# Patient Record
Sex: Female | Born: 1960 | Race: Black or African American | Hispanic: No | Smoking: Never smoker
Health system: Southern US, Community
[De-identification: ages and names within clinical notes are randomized; demographics above are authoritative.]

## PROBLEM LIST (undated history)

## (undated) DIAGNOSIS — G4733 Obstructive sleep apnea (adult) (pediatric): Secondary | ICD-10-CM

## (undated) DIAGNOSIS — I10 Essential (primary) hypertension: Secondary | ICD-10-CM

## (undated) DIAGNOSIS — K219 Gastro-esophageal reflux disease without esophagitis: Secondary | ICD-10-CM

## (undated) DIAGNOSIS — R600 Localized edema: Secondary | ICD-10-CM

## (undated) DIAGNOSIS — E119 Type 2 diabetes mellitus without complications: Secondary | ICD-10-CM

## (undated) DIAGNOSIS — Z9989 Dependence on other enabling machines and devices: Secondary | ICD-10-CM

## (undated) HISTORY — DX: Type 2 diabetes mellitus without complications: E11.9

## (undated) HISTORY — DX: Obstructive sleep apnea (adult) (pediatric): Z99.89

## (undated) HISTORY — DX: Essential (primary) hypertension: I10

## (undated) HISTORY — PX: TONSILLECTOMY: SUR1361

## (undated) HISTORY — DX: Gastro-esophageal reflux disease without esophagitis: K21.9

## (undated) HISTORY — DX: Obstructive sleep apnea (adult) (pediatric): G47.33

## (undated) HISTORY — DX: Localized edema: R60.0

---

## 2002-06-13 DIAGNOSIS — IMO0001 Reserved for inherently not codable concepts without codable children: Secondary | ICD-10-CM | POA: Insufficient documentation

## 2002-10-18 ENCOUNTER — Other Ambulatory Visit: Admission: RE | Admit: 2002-10-18 | Discharge: 2002-10-18 | Payer: Self-pay | Admitting: Family Medicine

## 2003-02-28 ENCOUNTER — Encounter: Payer: Self-pay | Admitting: Family Medicine

## 2003-02-28 ENCOUNTER — Encounter: Admission: RE | Admit: 2003-02-28 | Discharge: 2003-02-28 | Payer: Self-pay | Admitting: Family Medicine

## 2003-10-17 ENCOUNTER — Other Ambulatory Visit: Admission: RE | Admit: 2003-10-17 | Discharge: 2003-10-17 | Payer: Self-pay | Admitting: Family Medicine

## 2004-01-13 ENCOUNTER — Emergency Department (HOSPITAL_COMMUNITY): Admission: EM | Admit: 2004-01-13 | Discharge: 2004-01-13 | Payer: Self-pay | Admitting: Emergency Medicine

## 2004-03-05 ENCOUNTER — Encounter: Admission: RE | Admit: 2004-03-05 | Discharge: 2004-03-05 | Payer: Self-pay | Admitting: Family Medicine

## 2005-02-11 ENCOUNTER — Other Ambulatory Visit: Admission: RE | Admit: 2005-02-11 | Discharge: 2005-02-11 | Payer: Self-pay | Admitting: Family Medicine

## 2005-04-15 ENCOUNTER — Encounter: Admission: RE | Admit: 2005-04-15 | Discharge: 2005-04-15 | Payer: Self-pay | Admitting: Family Medicine

## 2006-07-07 ENCOUNTER — Other Ambulatory Visit: Admission: RE | Admit: 2006-07-07 | Discharge: 2006-07-07 | Payer: Self-pay | Admitting: Family Medicine

## 2006-08-25 ENCOUNTER — Encounter: Admission: RE | Admit: 2006-08-25 | Discharge: 2006-08-25 | Payer: Self-pay | Admitting: Family Medicine

## 2007-07-13 ENCOUNTER — Other Ambulatory Visit: Admission: RE | Admit: 2007-07-13 | Discharge: 2007-07-13 | Payer: Self-pay | Admitting: Family Medicine

## 2007-09-07 ENCOUNTER — Encounter: Admission: RE | Admit: 2007-09-07 | Discharge: 2007-09-07 | Payer: Self-pay | Admitting: Family Medicine

## 2008-05-23 ENCOUNTER — Inpatient Hospital Stay (HOSPITAL_COMMUNITY): Admission: RE | Admit: 2008-05-23 | Discharge: 2008-05-25 | Payer: Self-pay | Admitting: Otolaryngology

## 2008-05-23 ENCOUNTER — Encounter (INDEPENDENT_AMBULATORY_CARE_PROVIDER_SITE_OTHER): Payer: Self-pay | Admitting: Otolaryngology

## 2008-05-27 ENCOUNTER — Emergency Department (HOSPITAL_COMMUNITY): Admission: EM | Admit: 2008-05-27 | Discharge: 2008-05-27 | Payer: Self-pay | Admitting: Emergency Medicine

## 2009-11-06 ENCOUNTER — Other Ambulatory Visit: Admission: RE | Admit: 2009-11-06 | Discharge: 2009-11-06 | Payer: Self-pay | Admitting: Family Medicine
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
## 2010-10-26 NOTE — Discharge Summary (Signed)
Amanda Alexander, Amanda Alexander               ACCOUNT NO.:  [id number]   MEDICAL RECORD NO.:  [id number]          PATIENT TYPE:  INP   LOCATION:  3311                         FACILITY:  [facility]   PHYSICIAN:  Lucky Cowboy, MD         DATE OF BIRTH:  02-23-1961   DATE OF ADMISSION:  05/23/2008  DATE OF DISCHARGE:  05/25/2008                               DISCHARGE SUMMARY   DISCHARGE DIAGNOSES:  1. Right peritonsillar abscess with acute tonsillitis and acute airway      obstruction.  2. Diabetes mellitus, type 2.  3. Obesity, hyperventilation syndrome with obstructive sleep apnea      using CPAP.  4. Recently diagnosed H1N1.  5. Intermittent chest pain.   PROCEDURES PERFORMED:  Tonsillectomy with uvulopalatopharyngoplasty and  uvulectomy.   CONSULTATIONS:  [facility] hospitalist consultation followed by  evaluation by [facility] from [place].   HOSPITAL COURSE:  This patient is a 50 year old female who was initially  referred to my office by Dr. Carolin Coy on May 23, 2008, for  evaluation of right peritonsillar abscess and acute tonsillitis.  The  patient in fact did have these findings and due to impending airway, was  taken to [facility], admitted and initially evaluated by  [facility] physician, Dr. Jetty Duhamel, for surgical medical  clearance.  She was then taken to surgery where bilateral tonsillectomy,  uvulopalatopharyngoplasty, and uvulectomy were performed.  She did have  a right inferiorly based abscess.  The reason that  uvulopalatopharyngoplasty was performed is that she had intense edema,  which was approximately 2-cm thick and was coming off onto the posterior  pharyngeal tonsillar pillar and the concern was that even despite  tonsillectomy and drainage of the abscess that when the patient awakened  from anesthesia that there would acute airway obstruction and subsequent  to that if she passed that then she would still have problems with  sleep  apnea and airway maintenance at night.  This did open up for airway and  she was successfully extubated.  She was cared for in room 3311 in  Intermediate Care Unit.  She maintained 100% percent oxygenation while  on 2 L of oxygen on 5 cm of CPAP pressure.  Initial white blood cell  count was 17,000 and decreased to 13,000 by the following day.  She was  treated with IV Unasyn and continued on her home medications of  metformin 1000 mg q.a.m. and also hydrochlorothiazide 25 mg q.a.m.  She  was continued on Tamiflu 75 mg b.i.d., which was initially held the day  that she did come in.  She also had her aspirin 81 mg held in light that  she would be undergoing a procedure that has a 1-2% risk of the day 7  postoperative bleeding.  On postoperative day #1, the patient was  remarkably better.  She was able to tolerate some soft foods and fluids,  but was still having some difficulty with pain control.  Following day,  fluid intake was much better and she had been changed over to Roxicet  with breakthrough pain managed  by liquid oxycodone.  She did have mouth  ulcer on the right upper maxillary gingiva, which was improved with an  nystatin swish and spit out as Hurricane Gel.  Her underlying social  situation involves working full time as a Clinical biochemist at [employer].  Given this fact, she should not do any real strenuous activity  for 2 weeks and probably still will not feel up to it by 3 weeks.  She  has short-term disability and the forms will be completed with her when  they are given to my office at [facility].  On  postoperative day #2, she was doing well and was deemed ready to be  discharged, and was discharged from the Intermediate Care Unit, having  tolerated CPAP in the evening for 2 nights.  Discharge medications were  called in the [facility], to be followed up by a written script.  Tylenol with oxycodone elixir 10-20 mL p.o. q.4-6 h.  p.r.n. pain, 500 mL  were dispensed with no refill.  Oxycodone 5 mg/5 mL, 50 mL dispensed.  To be taken 2.5 mL p.o. q.4 h. p.r.n. breakthrough pain.  Can be given  between 2-3 hours after Roxicet dosage if needed.  Further conversation  is to take amoxicillin 250 mg per 10 mL p.o. t.i.d. for 5 days.  She was  also dispensed nystatin suspension 5 mL swish and spit out t.i.d. for 7  days.  She was given benzocaine to go home with, to use on the right  maxillary ulcer.  If this is not adequate amount, she can get this over  the counter at [facility] along with Peri-Colace that she is  advised to take b.i.d. until after completing the narcotic, oxycodone.  She is to continue metformin and hydrochlorothiazide at standard doses  of 1000 mg q.a.m. and 25 mg daily, respectively.  She was advised to  hold her aspirin for 10 days due to bleeding risk.  She is to drink at  least 6 ounces of fluid per hour and document everything that she drinks  including popsicles and ice cream.  She will be contacted through her  daughter, Sunny Schlein at number given to me by her daughters [phone] to  determine how she is doing and also to give her a 3-week postop  appointment.  Diet is full liquids to regular diet.  She is able to  tolerate most likely best that she avoid any ascitic or spicy-type foods  and fluids.  She may use the Hurricane Gel over 2 hours as needed.  She  is to continue to check her blood sugars and ranges should be a little  bit more liberal given this kind of situation and would be acceptable  from my standpoint, range between 100 and 200, however, Dr. Carolin Coy and should be notified and would be the ultimate authority on  where these sugars should be maintained at.  The family understands and  these instructions were discussed in detail with both the patient and  her daughter as well as the patient's son today and also yesterday.  She  was given a dose of narcotic pain  medicine prior her discharge and also  extra benzocaine.  She will follow up in the office in 3 weeks.  She is  discharged to her daughter's home in stable condition.      Lucky Cowboy, MD  Electronically Signed     SJ/MEDQ  D:  05/25/2008  T:  05/26/2008  Job:  [id number]   cc:   [facility]  Carola J. Gerri Spore, M.D.

## 2010-10-26 NOTE — Op Note (Signed)
Amanda Alexander, Amanda Alexander               ACCOUNT NO.:  [id number]   MEDICAL RECORD NO.:  [id number]          PATIENT TYPE:  OIB   LOCATION:  3311                         FACILITY:  [facility]   PHYSICIAN:  Lucky Cowboy, MD         DATE OF BIRTH:  September 03, 1960   DATE OF PROCEDURE:  05/23/2008  DATE OF DISCHARGE:                               OPERATIVE REPORT   PREOPERATIVE DIAGNOSES:  Right peritonsillar abscess, acute tonsillitis.   POSTOPERATIVE DIAGNOSES:  Right peritonsillar abscess, acute  tonsillitis.   PROCEDURE:  Bilateral palatine tonsillectomy, uvulopalatopharyngoplasty  with uvulectomy.   SURGEON:  Lucky Cowboy, MD   ANESTHESIA:  General endotracheal anesthesia.   ESTIMATED BLOOD LOSS:  30 mL.   SPECIMENS:  Uvula and bilateral palatine tonsils, aerobic and anaerobic  with Gram stain cultures from right inferior tonsillar pole abscess.   COMPLICATIONS:  None.   INDICATIONS:  This patient is a 50 year old female with a 3-day history  of severe throat swelling and trismus with right ear pain.  She was  diagnosed 4 days ago with H1N1 flu and started on Tamiflu, 2 days ago.  She then started with severe throat pain-type symptoms.  She was seen  today by Dr. Carolin Coy, who diagnosed right peritonsillar  abscess and sent her over to the office for evaluation.  A large right  peritonsillar abscess was diagnosed in the office and she was admitted  immediately for evaluation by a hospitalist prior to planning for  tonsillectomy.  The need for tonsillectomy was due to the patient's  underlying severe obstructive sleep apnea and impending airway  compromise and anticipated postoperative edema and effort to avoid  tracheotomy.  The entire situation with the concerns were discussed with  the patient and her 2 daughters.  The risks, benefits, and options were  discussed and they have elected to proceed.  The patient is a Multimedia programmer and signed appropriate paperwork.  I have agreed  to deny the  patient any blood products as she has requested.   FINDINGS:  The patient was noted to have a small right inferior  tonsillar pole abscess.  Cultures were obtained.  The patient did have  massive edema of the posterior pharyngeal wall with a line delineating  left from right.  The massive edema then came off the posterior  pharyngeal wall and for this reason, the uvulopalatopharyngoplasty and  first portion of the procedure was performed where the anterior and  posterior tonsillar pillars were securely stitched to one another to try  to bring that posterior tonsillar pillar near the lateral wall of the  posterior pharyngeal wall more laterally so as to give the patient  adequate airway as the edema extended down into the hypopharynx.  This  did not work relatively well; however, there is still significant amount  of edema.   DESCRIPTION OF PROCEDURE:  The patient was taken to the operating room  and placed on the table in the supine position.  She was then placed  under general endotracheal anesthesia and the table rotated  counterclockwise 90 degrees.  The  head and body were draped in the usual  fashion.  Crowe-Davis mouth gag with a #4 tongue blade was then placed  intraorally, opened and suspended on the Mayo stand.  Palpation of the  soft palate is with the findings as noted above.  Straight Allis clamp  was directed inferomedially.  Bovie cautery was then used to excise the  tonsil staying within the peritonsillar space.  There was moderate  amount of bleeding, which did not exceed 30 mL.  Cultures were obtained  from the purulent exudate in the inferior right tonsillar pole.  The  left palatine tonsil was removed in identical fashion.  At this point,  the uvula was removed after palpating the levator dimple.  The anterior  and posterior tonsillar pillars were reapproximated in a simple  interrupted fashion as was the raw edges from the uvula resection.  They  were  approximated using 4-0 Vicryl.  The nasopharynx and oral cavity  were irrigated and suctioned free of debris.  An NG tube was placed on  the esophagus for suctioning of the gastric contents.  The mouth gag was  removed noting no damage to the teeth or soft tissues.  The table was  then rotated anticlockwise.  The patient was then awakened from  anesthesia and extubated into the operating room.  She did have some  initial stridor, but as she was awakening, was able to breath and  maintain her airway on her own with significant suctioning with the  Yankauer.  She will then be returned to her room at 3307 in the  intermediate intensive care level.  She will be observed for 1-2 days  and followed also by the hospitalist.      Lucky Cowboy, MD  Electronically Signed     SJ/MEDQ  D:  05/23/2008  T:  05/24/2008  Job:  [id number]   cc:   [facility]  Carola J. Gerri Spore, M.D.

## 2010-10-26 NOTE — Consult Note (Signed)
[name], Amanda Alexander               ACCOUNT NO.:  [id number]   MEDICAL RECORD NO.:  [id number]          PATIENT TYPE:  OIB   LOCATION:  3311                         FACILITY:  [facility]   PHYSICIAN:  Amanda Alexander, M.D.DATE OF BIRTH:  1960-08-16   DATE OF CONSULTATION:  DATE OF DISCHARGE:                                 CONSULTATION   PRIMARY CARE PHYSICIAN:  Dr. Gerri Alexander with [facility] at [place].   REASON FOR CONSULTATION:  Preop evaluation of a diabetic hypertensive  patient with acute peritonsillar abscess.   HISTORY OF PRESENT ILLNESS:  Ms. Amanda Alexander is a very pleasant 50-  year-old female who was admitted to the hospital today by Dr. Langston Alexander, ENT physician, for surgical management of a large peritonsillar  abscess and severe tonsillitis.  While in the day surgery/preop area,  the patient spiked a temperature to 103.1 and required transfer to the  inpatient unit prior to her surgical procedure.  Dr. Gerilyn Alexander has asked  that I evaluate the patient preoperatively and help follow her clinical  course in the postoperative phase.   At the present time Ms. Amanda Alexander is resting comfortably in her bed in the  step-down unit at [facility].  Upon entering the room, she  complains of severe pain in her throat.  She does feel that her throat  is closing off.  She did not feel that it has worsened over the last 2  hours, however.  There is no stridor appreciable.  The patient is able  to answer questions and is completing full sentences.  She complains of  intermittent chest pain.  She states that she has had this for multiple  years.  She was given nitro for this, which typically assists with  relief of the pain.  She tells me that she underwent a cardiac eval in  2005 but she cannot tell me the exact results of that evaluation.  When  she is at her baseline, however, she does not experience severe dyspnea  on exertion and she does not typically have substernal chest pain  with  physical exertion.  She was evaluated on Monday of this week for initial  onset of her soreness of throat, general lethargy, body aches, and  chills.  She was diagnosed at that time with H1N1 and placed on Tamiflu.  When her symptoms worsened, she went see her primary care physician  today, who ultimately sent her to Dr. Gerilyn Alexander, resulting in her placement  here in the hospital.   REVIEW OF SYSTEMS:  The patient does not have current active symptoms to  suggest unstable angina pectoris.  Comprehensive review of systems is  negative with exception to the positive elements noted in the history of  present illness that is listed above.   PAST MEDICAL HISTORY:  1. Diabetes mellitus.  2. Hypertension.  3. Obesity, hyperventilation syndrome/sleep apnea - q.h.s. CPAP.  4. Recently-diagnosed H1N1.  5. Intermittent chest pain.      a.     Normal Cardiolite eval August 2005 with an EF of 72% as  confirmed by the primary care physician's office.      b.     Symptoms not consistent with true unstable angina pectoris.      c.     Negative biochemical, rule out via ER stay in 2005.   OUTPATIENT MEDICATIONS:  1. Metformin 1000 mg p.o. q.a.m.  2. HCTZ 25 mg daily.  3. Aspirin 81 mg daily.  4. Tamiflu 75 mg b.i.d., to stop today.  5. Multivitamin daily.   ALLERGIES:  NO KNOWN DRUG ALLERGIES.   FAMILY HISTORY:  The patient's mother died with complications from  diabetes.  The patient's father died from lung cancer and was a smoker.   SOCIAL HISTORY:  The patient does not smoke.  She lives in [place].  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   DATA REVIEW:  Hemoglobin is 11.2 with a white count of 15.2.  Normal  platelet count and normal MCV.  BMET is pending but is not readily  available.  A 12-lead EKG has been requested but is not yet  available.   PHYSICAL EXAM:  Temperature 100.2, Alexander pressure 134/65, heart rate 99,  respiratory rate 18, 02 sat is 100% on room air.  GENERAL:  An obese female in no acute respiratory distress.  HEENT:  Normocephalic, atraumatic.  Pupils equal, round, reactive to  light and accommodation.  Extraocular muscles are intact.  NECK:  No JVD.  LUNGS:  Clear to auscultation bilaterally without wheezing or rhonchi.  CARDIOVASCULAR:  Regular rate and rhythm without murmur, gallop or rub,  with normal S1 and S2.  ABDOMEN:  Obese.  Soft.  Bowel sounds present.  No organomegaly.  No  rebound.  No ascites.  EXTREMITIES:  Trace bilateral lower extremity edema.  No significant  cyanosis or clubbing.  NEUROLOGIC:  Alert and oriented x4.  Cranial II-XII intact bilaterally,  5/5 strength bilateral upper and lower extremities.  Intact sensation  noted throughout.   IMPRESSION AND PLAN:  1. Preop evaluation - Ms. Amanda Alexander does, in fact, have a severe acute      suppurative tonsillitis/peritonsillar abscess as diagnosed by Dr.      Gerilyn Alexander.  There is significant concern of her compromising her airway      due to the severity of her illness.  Dr. Gerilyn Alexander wishes to take the      patient to the operating room tonight to perform a complete      tonsillectomy.  At the present time I do not hear a gallop on exam.      There is no appreciable murmur.  The patient does not have symptoms      consistent with a true unstable angina pectoris.  The patient did      have a cardiac eval for her atypical chest pain in 2005, which was      entirely normal and revealed an ejection fraction of 72%.  At the      present time I do not feel there is much to be gained by prolonging      the patient's surgical procedure for further cardiac evaluation.      Indeed, I do not feel that this is indicated presently.  I will      classify her as a low-to-intermediate risk for her surgical      procedure, and would recommend proceeding  with it tonight,  understanding that risk.  This information has been relayed to the      patient.  As noted above, an echocardiogram has not yet been      accomplished.  I am asking that this be accomplished at the present      time.  If there are significant acute changes noted there, it could      change my assessment, but otherwise my plan will be as detailed      above.  2. Diabetes mellitus - diabetes control will be clearly indicated in      the perioperative phase.  We will hold the patient's metformin due      to the possibility of a need for contrasted studies during her      hospital stay.  We will place her on sliding-scale insulin and      adjust insulin therapy as indicated based upon the results of      frequent CBG checks.  3. Hypertension - the patient's Alexander pressure is currently reasonably      controlled.  We will hold her HCTZ due to a propensity for      dehydration that I suspect will occur in the postoperative state.      We will follow her Alexander pressure closely and initiate non diuretic      therapy as indicated.  4. Obesity, hyperventilation syndrome/sleep apnea - Continuous      positive airway pressure will be administered at the discretion of      Dr. Gerilyn Alexander.  I suspect that it will be inappropriate to use this in      the immediate perioperative period because of severe pain in the      throat and the likelihood of drying the wounds excessively.   During my evaluation of Ms. Hett, it became clear that she is actually  followed by Dr. Gerri Alexander at [facility] [place].  As a result, the  [facility] hospitalist service will be the most appropriate to provide  ongoing care.  In the interest of not further delaying the patient's  surgical course, however, I have proceeded with the initial evaluation.  I have contacted the on-call physician for [facility] hospitalist tonight and  they have agreed to assume care of the patient in the postoperative  period from a  standpoint of consultative service.      Amanda Alexander, M.D.  Electronically Signed     JTM/MEDQ  D:  05/23/2008  T:  05/23/2008  Job:  [id number]

## 2011-03-18 LAB — NO BLOOD PRODUCTS

## 2011-03-18 LAB — GLUCOSE, CAPILLARY
Glucose-Capillary: 108 mg/dL — ABNORMAL HIGH (ref 70–99)
Glucose-Capillary: 117 mg/dL — ABNORMAL HIGH (ref 70–99)
Glucose-Capillary: 122 mg/dL — ABNORMAL HIGH (ref 70–99)
Glucose-Capillary: 122 mg/dL — ABNORMAL HIGH (ref 70–99)
Glucose-Capillary: 124 mg/dL — ABNORMAL HIGH (ref 70–99)
Glucose-Capillary: 135 mg/dL — ABNORMAL HIGH (ref 70–99)
Glucose-Capillary: 152 mg/dL — ABNORMAL HIGH (ref 70–99)
Glucose-Capillary: 155 mg/dL — ABNORMAL HIGH (ref 70–99)

## 2011-03-18 LAB — COMPREHENSIVE METABOLIC PANEL
ALT: 19 U/L (ref 0–35)
AST: 16 U/L (ref 0–37)
Albumin: 2.5 g/dL — ABNORMAL LOW (ref 3.5–5.2)
Alkaline Phosphatase: 74 U/L (ref 39–117)
BUN: 8 mg/dL (ref 6–23)
CO2: 29 mEq/L (ref 19–32)
Calcium: 8.2 mg/dL — ABNORMAL LOW (ref 8.4–10.5)
Chloride: 96 mEq/L (ref 96–112)
Creatinine, Ser: 0.67 mg/dL (ref 0.4–1.2)
GFR calc Af Amer: >60 mL/min (ref >60)
GFR calc non Af Amer: >60 mL/min (ref >60)
Glucose, Bld: 130 mg/dL — ABNORMAL HIGH (ref 70–99)
Potassium: 3.7 mEq/L (ref 3.5–5.1)
Sodium: 133 mEq/L — ABNORMAL LOW (ref 135–145)
Total Bilirubin: 0.8 mg/dL (ref 0.3–1.2)
Total Protein: 6.3 g/dL (ref 6.0–8.3)

## 2011-03-18 LAB — TSH: TSH: 0.973 u[IU]/mL (ref 0.350–4.500)

## 2011-03-18 LAB — CBC
HCT: 29.2 % — ABNORMAL LOW (ref 36.0–46.0)
HCT: 33.5 % — ABNORMAL LOW (ref 36.0–46.0)
Hemoglobin: 11.2 g/dL — ABNORMAL LOW (ref 12.0–15.0)
Hemoglobin: 9.8 g/dL — ABNORMAL LOW (ref 12.0–15.0)
MCHC: 33.4 g/dL (ref 30.0–36.0)
MCHC: 33.5 g/dL (ref 30.0–36.0)
MCV: 87.8 fL (ref 78.0–100.0)
MCV: 88.4 fL (ref 78.0–100.0)
Platelets: 291 10*3/uL (ref 150–400)
Platelets: 326 10*3/uL (ref 150–400)
RBC: 3.3 MIL/uL — ABNORMAL LOW (ref 3.87–5.11)
RBC: 3.81 MIL/uL — ABNORMAL LOW (ref 3.87–5.11)
RDW: 15.5 % (ref 11.5–15.5)
RDW: 15.8 % — ABNORMAL HIGH (ref 11.5–15.5)
WBC: 13.2 10*3/uL — ABNORMAL HIGH (ref 4.0–10.5)
WBC: 15.2 10*3/uL — ABNORMAL HIGH (ref 4.0–10.5)

## 2011-03-18 LAB — ANAEROBIC CULTURE

## 2011-03-18 LAB — HEMOGLOBIN A1C
Hgb A1c MFr Bld: 6.7 % — ABNORMAL HIGH (ref 4.6–6.1)
Mean Plasma Glucose: 146 mg/dL

## 2011-03-18 LAB — DIFFERENTIAL
Basophils Absolute: 0.1 10*3/uL (ref 0.0–0.1)
Basophils Relative: 0 % (ref 0–1)
Eosinophils Absolute: 0 10*3/uL (ref 0.0–0.7)
Eosinophils Relative: 0 % (ref 0–5)
Lymphocytes Relative: 10 % — ABNORMAL LOW (ref 12–46)
Lymphs Abs: 1.6 10*3/uL (ref 0.7–4.0)
Monocytes Absolute: 1.5 10*3/uL — ABNORMAL HIGH (ref 0.1–1.0)
Monocytes Relative: 10 % (ref 3–12)
Neutro Abs: 12.1 10*3/uL — ABNORMAL HIGH (ref 1.7–7.7)
Neutrophils Relative %: 80 % — ABNORMAL HIGH (ref 43–77)

## 2011-03-18 LAB — BASIC METABOLIC PANEL
BUN: 7 mg/dL (ref 6–23)
CO2: 29 mEq/L (ref 19–32)
Calcium: 9.3 mg/dL (ref 8.4–10.5)
Chloride: 98 mEq/L (ref 96–112)
Creatinine, Ser: 0.73 mg/dL (ref 0.4–1.2)
GFR calc Af Amer: >60 mL/min (ref >60)
GFR calc non Af Amer: >60 mL/min (ref >60)
Glucose, Bld: 134 mg/dL — ABNORMAL HIGH (ref 70–99)
Potassium: 4 mEq/L (ref 3.5–5.1)
Sodium: 138 mEq/L (ref 135–145)

## 2011-03-18 LAB — WOUND CULTURE

## 2011-05-13 ENCOUNTER — Ambulatory Visit
Admission: RE | Admit: 2011-05-13 | Discharge: 2011-05-13 | Disposition: A | Discharge status: Home / Self Care | Payer: 59 | Source: Ambulatory Visit | Attending: Family Medicine | Admitting: Family Medicine

## 2011-05-13 ENCOUNTER — Other Ambulatory Visit: Payer: Self-pay | Admitting: Family Medicine

## 2011-05-13 DIAGNOSIS — M25571 Pain in right ankle and joints of right foot: Secondary | ICD-10-CM

## 2011-05-25 ENCOUNTER — Other Ambulatory Visit: Payer: Self-pay | Admitting: Family Medicine

## 2011-05-25 DIAGNOSIS — Z1231 Encounter for screening mammogram for malignant neoplasm of breast: Secondary | ICD-10-CM

## 2011-05-27 ENCOUNTER — Ambulatory Visit
Admission: RE | Admit: 2011-05-27 | Discharge: 2011-05-27 | Disposition: A | Discharge status: Home / Self Care | Payer: 59 | Source: Ambulatory Visit | Attending: Family Medicine | Admitting: Family Medicine

## 2011-05-27 DIAGNOSIS — Z1231 Encounter for screening mammogram for malignant neoplasm of breast: Secondary | ICD-10-CM

## 2012-03-27 ENCOUNTER — Other Ambulatory Visit: Payer: Self-pay | Admitting: Family Medicine

## 2012-03-27 DIAGNOSIS — Z1231 Encounter for screening mammogram for malignant neoplasm of breast: Secondary | ICD-10-CM

## 2012-05-01 ENCOUNTER — Other Ambulatory Visit: Payer: Self-pay | Admitting: Family Medicine

## 2012-05-01 ENCOUNTER — Other Ambulatory Visit (HOSPITAL_COMMUNITY)
Admission: RE | Admit: 2012-05-01 | Discharge: 2012-05-01 | Disposition: A | Discharge status: Home / Self Care | Payer: 59 | Source: Ambulatory Visit | Attending: Family Medicine | Admitting: Family Medicine

## 2012-05-01 DIAGNOSIS — Z124 Encounter for screening for malignant neoplasm of cervix: Secondary | ICD-10-CM | POA: Insufficient documentation

## 2012-05-28 ENCOUNTER — Ambulatory Visit
Admission: RE | Admit: 2012-05-28 | Discharge: 2012-05-28 | Disposition: A | Discharge status: Home / Self Care | Payer: 59 | Source: Ambulatory Visit | Attending: Family Medicine | Admitting: Family Medicine

## 2012-05-28 DIAGNOSIS — Z1231 Encounter for screening mammogram for malignant neoplasm of breast: Secondary | ICD-10-CM

## 2013-03-01 ENCOUNTER — Encounter: Payer: 59 | Admitting: Internal Medicine

## 2013-03-08 ENCOUNTER — Encounter: Payer: Self-pay | Admitting: Internal Medicine

## 2013-03-08 ENCOUNTER — Ambulatory Visit (INDEPENDENT_AMBULATORY_CARE_PROVIDER_SITE_OTHER): Payer: 59 | Admitting: Internal Medicine

## 2013-03-08 VITALS — BP 107/73 | HR 77 | Temp 97.6°F | Ht 61.0 in | Wt 233.5 lb

## 2013-03-08 DIAGNOSIS — Z0000 Encounter for general adult medical examination without abnormal findings: Secondary | ICD-10-CM

## 2013-03-08 DIAGNOSIS — I10 Essential (primary) hypertension: Secondary | ICD-10-CM

## 2013-03-08 DIAGNOSIS — G4733 Obstructive sleep apnea (adult) (pediatric): Secondary | ICD-10-CM

## 2013-03-08 DIAGNOSIS — Z9989 Dependence on other enabling machines and devices: Secondary | ICD-10-CM

## 2013-03-08 DIAGNOSIS — E119 Type 2 diabetes mellitus without complications: Secondary | ICD-10-CM

## 2013-03-08 LAB — COMPREHENSIVE METABOLIC PANEL
Albumin: 3.9 g/dL (ref 3.5–5.2)
CO2: 28 mEq/L (ref 19–32)
Glucose, Bld: 121 mg/dL — ABNORMAL HIGH (ref 70–99)
Potassium: 4.3 mEq/L (ref 3.5–5.3)
Sodium: 139 mEq/L (ref 135–145)
Total Protein: 7.5 g/dL (ref 6.0–8.3)

## 2013-03-08 LAB — CBC
Hemoglobin: 12.2 g/dL (ref 12.0–15.0)
RBC: 4.24 MIL/uL (ref 3.87–5.11)

## 2013-03-08 LAB — POCT GLYCOSYLATED HEMOGLOBIN (HGB A1C): Hemoglobin A1C: 7.5

## 2013-03-08 LAB — GLUCOSE, CAPILLARY: Glucose-Capillary: 115 mg/dL — ABNORMAL HIGH (ref 70–99)

## 2013-03-08 MED ORDER — LISINOPRIL-HYDROCHLOROTHIAZIDE 20-25 MG PO TABS: 1.0000 | ORAL_TABLET | Freq: Every day | ORAL | Status: DC

## 2013-03-08 MED ORDER — METFORMIN HCL 1000 MG PO TABS: 1000.0000 mg | ORAL_TABLET | Freq: Two times a day (BID) | ORAL | Status: DC

## 2013-03-08 MED ORDER — GLYBURIDE 2.5 MG PO TABS: 2.5000 mg | ORAL_TABLET | Freq: Every day | ORAL | Status: DC

## 2013-03-08 NOTE — Patient Instructions (Signed)
Goals (1 Years of Data) as of 03/08/13   None     It was a pleasure meeting you today and in the future as we will continue to work on your diabetes. Continue taking your medication and we will schedule you with our diabetes educator.   If you have any questions please don't hesitate to call our clinic: [phone]

## 2013-03-09 LAB — LIPID PANEL: Cholesterol: 174 mg/dL (ref 0–200)

## 2013-03-10 ENCOUNTER — Encounter: Payer: Self-pay | Admitting: Internal Medicine

## 2013-03-10 MED ORDER — GLYBURIDE 2.5 MG PO TABS: 2.5000 mg | ORAL_TABLET | Freq: Two times a day (BID) | ORAL | Status: DC

## 2013-03-10 NOTE — Progress Notes (Signed)
Subjective:   Patient ID: Amanda Alexander female   DOB: 10/22/1960 52 y.o.   MRN: [id number]  HPI: Amanda Alexander is a 52 y.o. AA woman who comes in to establish care with [facility]. This is her first visit. She states that she has long standing DM and HTN for 10+ years and had a physician whom retired and then she was dismissed from the practice for unknown reasons.  Medications, PMH, social hx, surgical and family hx were completed with the patient and recorded in the chart.   A full 12 point ROS was completed and was negative. The patient is aware of her hypoglycemic symptoms that consist of "jitteriness" and dizziness. She has not had those symptoms lately and CBG readings have not gone below 60.     History reviewed. No pertinent past medical history. Current Outpatient Prescriptions  Medication Sig Dispense Refill  . glyBURIDE (DIABETA) 2.5 MG tablet Take 1 tablet (2.5 mg total) by mouth daily with breakfast.  30 tablet  11  . lisinopril-hydrochlorothiazide (PRINZIDE,ZESTORETIC) 20-25 MG per tablet Take 1 tablet by mouth daily.  30 tablet  12  . metFORMIN (GLUCOPHAGE) 1000 MG tablet Take 1 tablet (1,000 mg total) by mouth 2 (two) times daily with a meal.  60 tablet  11   No current facility-administered medications for this visit.   History reviewed. No pertinent family history. History   Social History  . Marital Status: Widowed    Spouse Name: N/A    Number of Children: N/A  . Years of Education: N/A   Social History Main Topics  . Smoking status: Never Smoker   . Smokeless tobacco: None  . Alcohol Use: None  . Drug Use: None  . Sexual Activity: None   Other Topics Concern  . None   Social History Narrative  . None   Review of Systems: Constitutional: Denies fever, chills, diaphoresis, appetite change and fatigue.  HEENT: Denies photophobia, eye pain, redness, hearing loss, ear pain, congestion, sore throat, rhinorrhea, sneezing, mouth sores, trouble swallowing,  neck pain, neck stiffness and tinnitus.   Respiratory: Denies SOB, DOE, cough, chest tightness,  and wheezing.   Cardiovascular: Denies chest pain, palpitations and leg swelling.  Gastrointestinal: Denies nausea, vomiting, abdominal pain, diarrhea, constipation, blood in stool and abdominal distention.  Genitourinary: Denies dysuria, urgency, frequency, hematuria, flank pain and difficulty urinating.  Endocrine: Denies: hot or cold intolerance, sweats, changes in hair or nails, polyuria, polydipsia. Musculoskeletal: Denies myalgias, back pain, joint swelling, arthralgias and gait problem.  Skin: Denies pallor, rash and wound.  Neurological: Denies dizziness, seizures, syncope, weakness, light-headedness, numbness and headaches.  Hematological: Denies adenopathy. Easy bruising, personal or family bleeding history  Psychiatric/Behavioral: Denies suicidal ideation, mood changes, confusion, nervousness, sleep disturbance and agitation  Objective:  Physical Exam: Filed Vitals:   03/08/13 1431  BP: 107/73  Pulse: 77  Temp: 97.6 F (36.4 C)  TempSrc: Oral  Height: 5\' 1"  (1.549 m)  Weight: 233 lb 8 oz (105.915 kg)  SpO2: 100%   General: sitting in chair, comfortably HEENT: PERRL, EOMI, no scleral icterus Cardiac: RRR, no rubs, murmurs or gallops Pulm: clear to auscultation bilaterally, moving normal volumes of air Abd: soft, nontender, nondistended, BS present Ext: warm and well perfused, no pedal edema Neuro: alert and oriented X3, cranial nerves II-XII grossly intact   Assessment & Plan:  1. DM: Pt POCT HgbA1c 7.5 today but pt didn't bring in meter for analysis. Pt states had flu shot at her work.  -  DM foot exam -opthamology referral -urine microablumin at next visit -continue metformin 1000 mg BID and glybride 2.5mg  BID -DM educator referral  2. OSA: pt on cpap and has had sleep study from previous established care  3. HTN: pt has good adequate control at this time.  BP  Readings from Last 3 Encounters:  03/08/13 107/73   -continue HCTZ/lisinopril 25-20 mg pill -cmet  4. Health maintenance: pt states having had mammogram 2013 and colonoscopy 2012 and PAP smear in 2013 all per pt report normal.  -health release form to obtain old records -FLP, CBC  Pt discussed with Dr. Aundria Rud

## 2013-03-11 ENCOUNTER — Encounter: Payer: Self-pay | Admitting: Internal Medicine

## 2013-03-20 ENCOUNTER — Telehealth: Payer: Self-pay | Admitting: *Deleted

## 2013-03-20 NOTE — Telephone Encounter (Signed)
Call to pt to see if she has seen an Opthalmologist before and when will be a convenient time for her to go for an appointment.  Message left on pt's VM to call the Clinics with that information.  Angelina Ok, RN 03/20/2013 9:20 AM.

## 2013-04-01 ENCOUNTER — Encounter: Payer: 59 | Admitting: Dietician

## 2013-04-01 ENCOUNTER — Ambulatory Visit (INDEPENDENT_AMBULATORY_CARE_PROVIDER_SITE_OTHER): Payer: 59 | Admitting: Dietician

## 2013-04-01 VITALS — Ht 60.25 in | Wt 231.5 lb

## 2013-04-01 DIAGNOSIS — E119 Type 2 diabetes mellitus without complications: Secondary | ICD-10-CM

## 2013-04-01 NOTE — Patient Instructions (Signed)
Your goal for the next 3 weeks to help you with weight loss  400 -500 calories at each meal 1- take skin off chicken 2- drink water or diet soda, tea with splenda 3- Less alcohol drinks- rum  Please make a follow up appointment for 3-4 weeks

## 2013-04-01 NOTE — Progress Notes (Signed)
Diabetes Self-Management Education  Visit Number: Follow-up 1  04/01/2013 Amanda Alexander, identified by name and date of birth, is a 52 y.o. female with Type 2 diabetes  .      ASSESSMENT  Patient Concerns:   weight loss, hopes this may help her come off her C-PAP, diabetes medications and control her blood pressure  Height 5' 0.25" (1.53 m), weight 231 lb 8 oz (105.008 kg). Body mass index is 44.86 kg/(m^2).  Lab Results: LDL Cholesterol  Date Value Range Status  03/08/2013 90  0 - 99 mg/dL Final          Hemoglobin A1C  Date Value Range Status  03/08/2013 7.5   Final  05/24/2008 6.7 (NOTE)   The ADA recommends the following therapeutic goal for glycemic   control related to Hgb A1C measurement:   Goal of Therapy:   < 7.0% Hgb A1C   Reference: American Diabetes Association: Clinical Practice   Recommendations 2008, Diabetes Care,  2008, 31:(Suppl 1).* 4.6 - 6.1 % Final     Family History  Problem Relation Age of Onset  . Cancer Father    History  Substance Use Topics  . Smoking status: Never Smoker   . Smokeless tobacco: Not on file  . Alcohol Use: No    Support Systems:  need to assess at future visit Special Needs:    no Prior DM Education:  no Daily Foot Exams:   need to assess at future visit  Patient Belief / Attitude about Diabetes:   diabetes can be controlled  Assessment comments: wants to concentrate of weight loss, need to address self monitoring/hypoglycemia at next visit because patient mentioned feeling shaky sometimes   Diet Recall:  Breakfast: (7-9 am)2 small bagels, 2 hard boiled egg and water,  Lunch: (1130-2 PM) fried chicken breast, mixed vegetables, fruit bowl, water or tea with Splenda Dinner: (6-630) salisbury steak- 6-8 oz, mashed potatoes with gravy, , canned string beans with salt & pepper, 12 oz regular soda Several Rum and diet coke or wine occasionally on weekends  Individualized Plan for Diabetes Self-Management  Training:  Patient individualized diabetes plan discussed today with patient and includes: what is Diabetes, Nutrition, medications, monitoring, physical activity, how to handle highs and lows, Special care for my body when I have diabetes, Dealing daily with diabetes  Education Topics Reviewed with Patient Today:  Topic Points Discussed  Disease State    Nutrition Management Role of diet in the treatment of diabetes and the relationship between the three main macronutrients and blood glucose level  Physical Activity and Exercise    Medications    Monitoring    Acute Complications    Chronic Complications    Psychosocial Adjustment    Goal Setting Helped patient develop diabetes management plan for (enter comment)  Preconception Care (if applicable)      PATIENTS GOALS   Learning Objective(s):     Goal The patient agrees to:  Nutrition Follow meal plan discussed 1- water or diet drinks or tea with Splenda instead of regular soda,2, take skin off fried meats, 3- less alcohol, goal is 400-500 calories per meal   Physical Activity    Medications    Monitoring    Problem Solving    Reducing Risk    Health Coping     Patient Self-Evaluation of Goals (Subsequent Visits)  Goal The patient rates self as meeting goals (% of time)  Nutrition   motivation and confidence that she can do better  a "5"  Physical Activity    Medications    Monitoring    Problem Solving    Reducing Risk    Health Coping       PERSONALIZED PLAN / SUPPORT  Self-Management Support:  Doctor's office;CDE visits ______________________________________________________________________  Outcomes  Expected Outcomes:  Demonstrated interest in learning. Expect positive outcomes Self-Care Barriers:    financial Education material provided: yes, book on meal planning for diabetes If problems or questions, patient to contact team via:  Phone Time in: 0930     Time out: 1030 Future DSME appointment: - 4-6  wks   Plyler, Lupita Leash 04/01/2013 10:45 AM

## 2013-04-26 ENCOUNTER — Ambulatory Visit (INDEPENDENT_AMBULATORY_CARE_PROVIDER_SITE_OTHER): Payer: 59 | Admitting: Internal Medicine

## 2013-04-26 ENCOUNTER — Encounter (INDEPENDENT_AMBULATORY_CARE_PROVIDER_SITE_OTHER): Payer: Self-pay

## 2013-04-26 ENCOUNTER — Encounter: Payer: Self-pay | Admitting: Internal Medicine

## 2013-04-26 VITALS — BP 104/67 | HR 61 | Temp 97.6°F | Ht 61.0 in | Wt 233.0 lb

## 2013-04-26 DIAGNOSIS — I10 Essential (primary) hypertension: Secondary | ICD-10-CM | POA: Insufficient documentation

## 2013-04-26 DIAGNOSIS — G4733 Obstructive sleep apnea (adult) (pediatric): Secondary | ICD-10-CM

## 2013-04-26 DIAGNOSIS — Z9989 Dependence on other enabling machines and devices: Secondary | ICD-10-CM | POA: Insufficient documentation

## 2013-04-26 DIAGNOSIS — E119 Type 2 diabetes mellitus without complications: Secondary | ICD-10-CM

## 2013-04-26 DIAGNOSIS — Z0000 Encounter for general adult medical examination without abnormal findings: Secondary | ICD-10-CM | POA: Insufficient documentation

## 2013-04-26 DIAGNOSIS — Z23 Encounter for immunization: Secondary | ICD-10-CM

## 2013-04-26 LAB — GLUCOSE, CAPILLARY

## 2013-04-26 MED ORDER — METFORMIN HCL 1000 MG PO TABS: 1000.0000 mg | ORAL_TABLET | Freq: Two times a day (BID) | ORAL | Status: DC

## 2013-04-26 MED ORDER — GLYBURIDE 5 MG PO TABS: 5.0000 mg | ORAL_TABLET | Freq: Two times a day (BID) | ORAL | Status: DC

## 2013-04-26 MED ORDER — GLYBURIDE 5 MG PO TABS: 2.5000 mg | ORAL_TABLET | Freq: Two times a day (BID) | ORAL | Status: DC

## 2013-04-26 NOTE — Assessment & Plan Note (Signed)
BP Readings from Last 3 Encounters:  04/26/13 104/67  03/08/13 107/73    Lab Results  Component Value Date   NA 139 03/08/2013   K 4.3 03/08/2013   CREATININE 0.73 03/08/2013    Assessment: Blood pressure control: controlled Progress toward BP goal:  at goal Comments:   Plan: Medications:  continue current medications Educational resources provided: brochure;handout Self management tools provided:   Other plans: Blood pressure is well controlled. We'll continue current regimen.

## 2013-04-26 NOTE — Progress Notes (Signed)
Patient ID: Amanda Alexander, female   DOB: [birth year], 52 y.o.   MRN: [id number] Subjective:   Patient ID: Amanda Alexander female   DOB: [birth year] 52 y.o.   MRN: [id number]  CC:   Follow up visit.    HPI:  [name] is a 52 y.o. lady with past medical history as outlined below, who presents for a followup visit today  1. DM-II: Patient is currently taking metformin 1000 mg twice a day, and glyburide 2.5 mg twice a day. She reports that she possibly had one episode of low blood sugar approximately one month ago. She had had shaking, but no palpitation. It was because that she did not eat her breakfast. She treated herself by taking some sweet food and her symptoms resolved quickly. Her recent A1c was 7.5 at 03/08/13.  2. HTN : bp is normal today. She is taking Prinzide 20/25 mg daily. She does not have chest pain, shortness of breath.  3. OSA: patient is on CPAP at home. No new issues.  ROS:  Denies fever, chills, fatigue, headaches,  cough, chest pain, SOB,  abdominal pain,diarrhea, constipation, dysuria, urgency, frequency, hematuria, joint pain or leg swelling.      Past Medical History  Diagnosis Date  . Hypertension   . Diabetes mellitus without complication   . OSA on CPAP    Current Outpatient Prescriptions  Medication Sig Dispense Refill  . BABY ASPIRIN PO Take 81 mg by mouth every morning.      . glyBURIDE (DIABETA) 2.5 MG tablet Take 1 tablet (2.5 mg total) by mouth 2 (two) times daily with a meal.  30 tablet  11  . lisinopril-hydrochlorothiazide (PRINZIDE,ZESTORETIC) 20-25 MG per tablet Take 1 tablet by mouth daily.  30 tablet  12  . metFORMIN (GLUCOPHAGE) 1000 MG tablet Take 1 tablet (1,000 mg total) by mouth 2 (two) times daily with a meal.  60 tablet  11   No current facility-administered medications for this visit.   Family History  Problem Relation Age of Onset  . Cancer Father    History   Social History  . Marital Status: Widowed    Spouse Name: N/A     Number of Children: N/A  . Years of Education: N/A   Social History Main Topics  . Smoking status: Never Smoker   . Smokeless tobacco: None  . Alcohol Use: No  . Drug Use: No  . Sexual Activity: No   Other Topics Concern  . None   Social History Narrative  . None    Review of Systems: Full 14-point review of systems otherwise negative. See HPI.   Objective:  Physical Exam: Filed Vitals:   04/26/13 1322  BP: 104/67  Pulse: 61  Temp: 97.6 F (36.4 C)  TempSrc: Oral  Height: 5\' 1"  (1.549 m)  Weight: 233 lb (105.688 kg)  SpO2: 98%   General: Not in acute distress HEENT: PERRL, EOMI, no scleral icterus, No JVD or bruit Cardiac: S1/S2, RRR, No murmurs, gallops or rubs Pulm: Good air movement bilaterally. Clear to auscultation bilaterally. No rales, wheezing, rhonchi or rubs. Abd: Soft, nondistended, nontender, no rebound pain, no organomegaly, BS present Ext: trace leg edema bilaterally. 2+DP/PT pulse bilaterally Musculoskeletal: No joint deformities, erythema, or stiffness, ROM full Skin: No rashes.  Neuro: Alert and oriented X3, cranial nerves II-XII grossly intact, muscle strength 5/5 in all extremeties, sensation to light touch intact.   Negative Babinski's sign. Normal finger to nose test. Psych: Patient is not  psychotic, no suicidal or hemocidal ideation.   Assessment & Plan:

## 2013-04-26 NOTE — Assessment & Plan Note (Addendum)
Lab Results  Component Value Date   HGBA1C 7.5 03/08/2013   HGBA1C  Value: 6.7 (NOTE)   The ADA recommends the following therapeutic goal for glycemic   control related to Hgb A1C measurement:   Goal of Therapy:   < 7.0% Hgb A1C   Reference: American Diabetes Association: Clinical Practice   Recommendations 2008, Diabetes Care,  2008, 31:(Suppl 1).* 05/24/2008     Assessment: Diabetes control: fair control Progress toward A1C goal:  Deteriorated. Comments:   Plan: Medications:  Will increase glyburide dosage from 2.5-5 mg twice a day Home glucose monitoring: Frequency:   Timing:   Instruction/counseling given: reminded to bring blood glucose meter & log to each visit Educational resources provided: brochure;handout Self management tools provided:   Other plans: Patient's A1c is not at goal (7.5).  Although she had one episode of hypoglycemia, patient seems to be well educated to recognize and treat herself. Will increase glyburide dosage from 2.5 to 5 mg twice a day. The patient is educated to manage her blood sugar level at least once a day. Will followup in 2 months.

## 2013-04-26 NOTE — Patient Instructions (Addendum)
1. please increase the glyburide dosage from 2.5 mg to 5.0 mg twice a day. Please check your blood sugar at least once a day.  2. Please take all medications as prescribed.  3. If you have worsening of your symptoms or new symptoms arise, please call the clinic ([phone]), or go to the ER immediately if symptoms are severe.  You have done great job in taking all your medications. I appreciate it very much. Please continue doing that.     Hypoglycemia (Low Blood Sugar) Hypoglycemia is when the glucose (sugar) in your blood is too low. Hypoglycemia can happen for many reasons. It can happen to people with or without diabetes. Hypoglycemia can develop quickly and can be a medical emergency.  CAUSES  Having hypoglycemia does not mean that you will develop diabetes. Different causes include:  Missed or delayed meals or not enough carbohydrates eaten.  Medication overdose. This could be by accident or deliberate. If by accident, your medication may need to be adjusted or changed.  Exercise or increased activity without adjustments in carbohydrates or medications.  A nerve disorder that affects body functions like your heart rate, blood pressure and digestion (autonomic neuropathy).  A condition where the stomach muscles do not function properly (gastroparesis). Therefore, medications may not absorb properly.  The inability to recognize the signs of hypoglycemia (hypoglycemic unawareness).  Absorption of insulin  may be altered.  Alcohol consumption.  Pregnancy/menstrual cycles/postpartum. This may be due to hormones.  Certain kinds of tumors. This is very rare. SYMPTOMS   Sweating.  Hunger.  Dizziness.  Blurred vision.  Drowsiness.  Weakness.  Headache.  Rapid heart beat.  Shakiness.  Nervousness. DIAGNOSIS  Diagnosis is made by monitoring blood glucose in one or all of the following ways:  Fingerstick blood glucose monitoring.  Laboratory results. TREATMENT  If  you think your blood glucose is low:  Check your blood glucose, if possible. If it is less than 70 mg/dl, take one of the following:  3-4 glucose tablets.   cup juice (prefer clear like apple).   cup "regular" soda pop.  1 cup milk.  -1 tube of glucose gel.  5-6 hard candies.  Do not over treat because your blood glucose (sugar) will only go too high.  Wait 15 minutes and recheck your blood glucose. If it is still less than 70 mg/dl (or below your target range), repeat treatment.  Eat a snack if it is more than one hour until your next meal. Sometimes, your blood glucose may go so low that you are unable to treat yourself. You may need someone to help you. You may even pass out or be unable to swallow. This may require you to get an injection of glucagon, which raises the blood glucose. HOME CARE INSTRUCTIONS  Check blood glucose as recommended by your caregiver.  Take medication as prescribed by your caregiver.  Follow your meal plan. Do not skip meals. Eat on time.  If you are going to drink alcohol, drink it only with meals.  Check your blood glucose before driving.  Check your blood glucose before and after exercise. If you exercise longer or different than usual, be sure to check blood glucose more frequently.  Always carry treatment with you. Glucose tablets are the easiest to carry.  Always wear medical alert jewelry or carry some form of identification that states that you have diabetes. This will alert people that you have diabetes. If you have hypoglycemia, they will have a better idea  on what to do. SEEK MEDICAL CARE IF:   You are having problems keeping your blood sugar at target range.  You are having frequent episodes of hypoglycemia.  You feel you might be having side effects from your medicines.  You have symptoms of an illness that is not improving after 3-4 days.  You notice a change in vision or a new problem with your vision. SEEK IMMEDIATE  MEDICAL CARE IF:   You are a family member or friend of a person whose blood glucose goes below 70 mg/dl and is accompanied by:  Confusion.  A change in mental status.  The inability to swallow.  Passing out. Document Released: 05/30/2005 Document Revised: 08/22/2011 Document Reviewed: 09/26/2011 [facility] Patient Information 2014 [place], [place].

## 2013-04-26 NOTE — Assessment & Plan Note (Signed)
-   will give pneumococcal vaccination shot - Recheck microalbumin/creatinine ratio today.

## 2013-04-27 LAB — MICROALBUMIN / CREATININE URINE RATIO
Microalb Creat Ratio: 6.3 mg/g (ref 0.0–30.0)
Microalb, Ur: 0.5 mg/dL (ref 0.00–1.89)

## 2013-04-30 NOTE — Progress Notes (Signed)
Case discussed with Dr. Niu at the time of the visit.  We reviewed the resident's history and exam and pertinent patient test results.  I agree with the assessment, diagnosis, and plan of care documented in the resident's note.    

## 2013-05-28 ENCOUNTER — Other Ambulatory Visit: Payer: Self-pay | Admitting: *Deleted

## 2013-05-28 DIAGNOSIS — E119 Type 2 diabetes mellitus without complications: Secondary | ICD-10-CM

## 2013-05-28 NOTE — Telephone Encounter (Signed)
Call from the pharmacy - requesting 90 day supply. Thanks

## 2013-05-29 MED ORDER — METFORMIN HCL 1000 MG PO TABS: 1000.0000 mg | ORAL_TABLET | Freq: Two times a day (BID) | ORAL | Status: DC

## 2013-06-28 ENCOUNTER — Telehealth: Payer: Self-pay | Admitting: *Deleted

## 2013-06-28 MED ORDER — TRAMADOL HCL 50 MG PO TABS: 50.0000 mg | ORAL_TABLET | Freq: Four times a day (QID) | ORAL | Status: DC | PRN

## 2013-06-28 NOTE — Telephone Encounter (Signed)
Pt called asking for a Rx for Tramadol to take PRN.  This is not listed in EPIC.  I called pt and she states  Dr Clyde CanterburyWesterman gave it to her for back pain. Pt's sister died last Friday and funeral is today.  Pt is under stress and having back pain.  Not sleeping. I called the pharmacy and pt did get a Rx for Tramadol 50 mg take every 6 hours for pain # 60.  Pt is scheduled to see Dr Burtis JunesSadek on 07/05/13

## 2013-07-05 ENCOUNTER — Encounter: Payer: Self-pay | Admitting: Internal Medicine

## 2013-07-05 ENCOUNTER — Ambulatory Visit (INDEPENDENT_AMBULATORY_CARE_PROVIDER_SITE_OTHER): Payer: 59 | Admitting: Internal Medicine

## 2013-07-05 VITALS — BP 110/67 | HR 74 | Temp 97.3°F | Ht 60.0 in | Wt 234.5 lb

## 2013-07-05 DIAGNOSIS — I10 Essential (primary) hypertension: Secondary | ICD-10-CM

## 2013-07-05 DIAGNOSIS — E119 Type 2 diabetes mellitus without complications: Secondary | ICD-10-CM

## 2013-07-05 DIAGNOSIS — F4321 Adjustment disorder with depressed mood: Secondary | ICD-10-CM

## 2013-07-05 DIAGNOSIS — F4320 Adjustment disorder, unspecified: Secondary | ICD-10-CM | POA: Insufficient documentation

## 2013-07-05 LAB — GLUCOSE, CAPILLARY: Glucose-Capillary: 233 mg/dL — ABNORMAL HIGH (ref 70–99)

## 2013-07-05 MED ORDER — TRAMADOL HCL 50 MG PO TABS: 50.0000 mg | ORAL_TABLET | Freq: Four times a day (QID) | ORAL | Status: DC | PRN

## 2013-07-05 MED ORDER — LISINOPRIL-HYDROCHLOROTHIAZIDE 20-25 MG PO TABS: 1.0000 | ORAL_TABLET | Freq: Every day | ORAL | Status: DC

## 2013-07-05 NOTE — Patient Instructions (Signed)
We will make no changes to your medicine.   I am very sorry to hear about your sister and I am sure it meant the world to her to have you with her. You have a strong spirit and keep on the long road of healing!   Thank you and happy new year.

## 2013-07-05 NOTE — Assessment & Plan Note (Addendum)
Pt has increased her metformin but not glypuride and had no hypoglycemic episodes since that time. Pt last microalbumin showed no proteinuria. Pt didn't bring in meter therefore hard to address whether to increase glyburide will evaluate at next visit.  Lab Results  Component Value Date   HGBA1C 7.5 03/08/2013   -cont metformin 1000mg  bid -cont glypuride 2.5mg  bid as pt is taking and consider increasing as previous plan based on CBG readings at next visit -eye exam will be addressed at next visit per patient -f/u in 2 wks for hgbA1c and cbg meter readings

## 2013-07-05 NOTE — Assessment & Plan Note (Signed)
BP Readings from Last 3 Encounters:  07/05/13 110/67  04/26/13 104/67  03/08/13 107/73   Pt BP is wonderfully controlled. Will continue current medication of prinzide 20-25mg  qdaily

## 2013-07-05 NOTE — Progress Notes (Signed)
Case discussed with Dr. Sadek soon after the resident saw the patient.  We reviewed the resident's history and exam and pertinent patient test results.  I agree with the assessment, diagnosis, and plan of care documented in the resident's note. 

## 2013-07-05 NOTE — Progress Notes (Signed)
   Subjective:    Patient ID: Vernard GamblesCandace F Wonder, female    DOB: 02/06/1961, 53 y.o.   MRN: [id number]  HPI Ms. Lira is a 53 yo woman pmh as listed below presents for DM recheck.   Pt was recently increased on her glupuride from 2.5mg  to 5 mg bid. Since that time the patient has not increased her glyburide but her metformin she increased to BID dosing. Pt did have hypoglycemia previously and therefore was cautious in uptitrating her meds. She doesn't bring in her meter today but states that her average preprandials are in the 120-140 and the highest has been 218 and her lowest was 98. She is very aware of her hypoglycemic symptoms and has not had any since increasing her metformin.  She recently lost her sister 2 wks ago and has been having some change in bowel habits since that time with alternating constipation and loose stools. Since her sister's death she has been eating more carbohydrates as a coping mechansim. She states that her appetite and eating habits have changed since this event and she was witness to CPR and the demise of her sister while in the hospital. 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 She does have some slight sleep disturbance but is seeing a group counselor and has great joy when visiting her grandson and raising an 458 yr old from one of her sisters children.    Review of Systems  Constitutional: Positive for appetite change (fluctuating in setting of recent loss of sister). Negative for fever, chills, activity change, fatigue and unexpected weight change.  Respiratory: Negative for cough, chest tightness and shortness of breath.   Cardiovascular: Negative for chest pain, palpitations and leg swelling.  Gastrointestinal: Positive for diarrhea (loose stools more than frank diarrhea) and constipation. Negative for nausea, vomiting, abdominal pain, abdominal distention and anal bleeding.  Endocrine: Negative for polydipsia and polyuria.    Musculoskeletal: Negative for arthralgias.  Skin: Negative for rash.  Allergic/Immunologic: Negative for environmental allergies and food allergies.  Neurological: Negative for dizziness and headaches.  Psychiatric/Behavioral: Positive for sleep disturbance. Negative for suicidal ideas, hallucinations, self-injury and decreased concentration. The patient is not nervous/anxious.     Past Medical History  Diagnosis Date  . Hypertension   . Diabetes mellitus without complication   . OSA on CPAP    Social, surgical, family history reviewed with patient and updated in appropriate chart locations.     Objective:   Physical Exam Filed Vitals:   07/05/13 1031  BP: 110/67  Pulse: 74  Temp: 97.3 F (36.3 C)   General: sitting in chair, NAD, tearful  HEENT: PERRL, EOMI, no scleral icterus Cardiac: RRR, no rubs, murmurs or gallops Pulm: clear to auscultation bilaterally, moving normal volumes of air Abd: soft, nontender, nondistended, BS present Ext: warm and well perfused, no pedal edema Neuro: alert and oriented X3, cranial nerves II-XII grossly intact    Assessment & Plan:  Please see problem oriented charting  Pt discussed with Dr. Rogelia BogaButcher

## 2013-07-05 NOTE — Assessment & Plan Note (Signed)
Pt recently lost sister 2 wks ago and up to 5 sisters over the span of 2 years. She was witness to code blue situation that resulted in her sister's demise and this is very distressing to the patient. She has no SI or loss of ADL function which is reassuring that this at this time doesn't need further evaluation or referral to psychiatry. Pt is seeing a counselor and finds joy in her grandson and raising one of her sister's daughters.  -re-evaluate -continued to encourage counseling

## 2013-08-01 ENCOUNTER — Other Ambulatory Visit: Payer: Self-pay | Admitting: *Deleted

## 2013-08-01 DIAGNOSIS — F4321 Adjustment disorder with depressed mood: Secondary | ICD-10-CM

## 2013-08-02 MED ORDER — TRAMADOL HCL 50 MG PO TABS: 50.0000 mg | ORAL_TABLET | Freq: Four times a day (QID) | ORAL | Status: DC | PRN

## 2013-08-05 NOTE — Telephone Encounter (Signed)
Rx called in to pharmacy at [phone]. Stanton KidneyDebra Bobbie Valletta RN 08/05/13 9:30AM

## 2013-08-16 ENCOUNTER — Other Ambulatory Visit: Payer: Self-pay | Admitting: *Deleted

## 2013-08-16 DIAGNOSIS — I10 Essential (primary) hypertension: Secondary | ICD-10-CM

## 2013-08-16 MED ORDER — LISINOPRIL-HYDROCHLOROTHIAZIDE 20-25 MG PO TABS
1.0000 | ORAL_TABLET | Freq: Every day | ORAL | Status: DC
Start: 2013-08-16 — End: 2013-12-06

## 2013-08-16 NOTE — Telephone Encounter (Signed)
Pt requesting 90 day supply which is cheaper.

## 2013-10-25 ENCOUNTER — Other Ambulatory Visit: Payer: Self-pay

## 2013-10-25 ENCOUNTER — Ambulatory Visit
Admission: RE | Admit: 2013-10-25 | Discharge: 2013-10-25 | Disposition: A | Discharge status: Home / Self Care | Payer: 59 | Source: Ambulatory Visit

## 2013-10-25 ENCOUNTER — Encounter (INDEPENDENT_AMBULATORY_CARE_PROVIDER_SITE_OTHER): Payer: Self-pay

## 2013-10-25 DIAGNOSIS — Z1231 Encounter for screening mammogram for malignant neoplasm of breast: Secondary | ICD-10-CM

## 2013-11-07 ENCOUNTER — Other Ambulatory Visit: Payer: Self-pay | Admitting: Internal Medicine

## 2013-11-22 ENCOUNTER — Other Ambulatory Visit: Payer: Self-pay | Admitting: Internal Medicine

## 2013-12-06 ENCOUNTER — Ambulatory Visit (INDEPENDENT_AMBULATORY_CARE_PROVIDER_SITE_OTHER): Payer: 59 | Admitting: Internal Medicine

## 2013-12-06 ENCOUNTER — Encounter: Payer: Self-pay | Admitting: Internal Medicine

## 2013-12-06 VITALS — BP 102/70 | HR 70 | Temp 97.6°F | Ht 60.0 in | Wt 231.9 lb

## 2013-12-06 DIAGNOSIS — I10 Essential (primary) hypertension: Secondary | ICD-10-CM

## 2013-12-06 DIAGNOSIS — IMO0001 Reserved for inherently not codable concepts without codable children: Secondary | ICD-10-CM

## 2013-12-06 DIAGNOSIS — E119 Type 2 diabetes mellitus without complications: Secondary | ICD-10-CM

## 2013-12-06 DIAGNOSIS — M7918 Myalgia, other site: Secondary | ICD-10-CM | POA: Insufficient documentation

## 2013-12-06 LAB — GLUCOSE, CAPILLARY: GLUCOSE-CAPILLARY: 203 mg/dL — AB (ref 70–99)

## 2013-12-06 LAB — POCT GLYCOSYLATED HEMOGLOBIN (HGB A1C): Hemoglobin A1C: 9.8

## 2013-12-06 MED ORDER — GLUCOSE BLOOD VI STRP: ORAL_STRIP | Status: DC

## 2013-12-06 MED ORDER — ACCU-CHEK NANO SMARTVIEW W/DEVICE KIT: 1.0000 | PACK | Freq: Once | Status: DC

## 2013-12-06 MED ORDER — GLYBURIDE 5 MG PO TABS: 5.0000 mg | ORAL_TABLET | Freq: Two times a day (BID) | ORAL | Status: DC

## 2013-12-06 MED ORDER — ACCU-CHEK FASTCLIX LANCETS MISC: 1.0000 | Freq: Two times a day (BID) | Status: AC

## 2013-12-06 MED ORDER — TRAMADOL HCL 50 MG PO TABS: 50.0000 mg | ORAL_TABLET | Freq: Four times a day (QID) | ORAL | Status: DC | PRN

## 2013-12-06 MED ORDER — LISINOPRIL-HYDROCHLOROTHIAZIDE 20-25 MG PO TABS: 1.0000 | ORAL_TABLET | Freq: Every day | ORAL | Status: DC

## 2013-12-06 MED ORDER — METFORMIN HCL 1000 MG PO TABS: 1000.0000 mg | ORAL_TABLET | Freq: Two times a day (BID) | ORAL | Status: DC

## 2013-12-06 NOTE — Assessment & Plan Note (Signed)
Pt has no red flag symptoms to weren't immediate imaging. Patient has been starting to get back to exercise and this may be a cause of paraspinal muscle tenderness. Patient still has normal strength and normal sensation. -Tramadol -Physical therapy was refused by the patient and she will try to continue with her exercise before needing more directed exercise

## 2013-12-06 NOTE — Progress Notes (Signed)
Case discussed with Dr. Sadek soon after the resident saw the patient.  We reviewed the resident's history and exam and pertinent patient test results.  I agree with the assessment, diagnosis, and plan of care documented in the resident's note. 

## 2013-12-06 NOTE — Patient Instructions (Signed)
General Instructions:   Please bring your medicines with you each time you come to clinic.  Medicines may include prescription medications, over-the-counter medications, herbal remedies, eye drops, vitamins, or other pills.   Progress Toward Treatment Goals:  Treatment Goal 04/26/2013  Hemoglobin A1C unchanged  Blood pressure at goal    Self Care Goals & Plans:  Self Care Goal 12/06/2013  Manage my medications take my medicines as prescribed; bring my medications to every visit; refill my medications on time  Eat healthy foods drink diet soda or water instead of juice or soda; eat more vegetables; eat foods that are low in salt; eat baked foods instead of fried foods; eat fruit for snacks and desserts    No flowsheet data found.   Care Management & Community Referrals:  No flowsheet data found.

## 2013-12-06 NOTE — Assessment & Plan Note (Addendum)
BP Readings from Last 3 Encounters:  12/06/13 102/70  07/05/13 110/67  04/26/13 104/67    Lab Results  Component Value Date   NA 139 03/08/2013   K 4.3 03/08/2013   CREATININE 0.73 03/08/2013    Assessment: Blood pressure control:  at goal Progress toward BP goal:    Comments:   Plan: Medications:  continue current medications prinzide 20-25mg  qd Educational resources provided:   Self management tools provided:   Other plans:

## 2013-12-06 NOTE — Assessment & Plan Note (Addendum)
Lab Results  Component Value Date   HGBA1C 9.8 12/06/2013   HGBA1C 7.5 03/08/2013   HGBA1C  Value: 6.7 (NOTE)   The ADA recommends the following therapeutic goal for glycemic   control related to Hgb A1C measurement:   Goal of Therapy:   < 7.0% Hgb A1C   Reference: American Diabetes Association: Clinical Practice   Recommendations 2008, Diabetes Care,  2008, 31:(Suppl 1).* 05/24/2008     Assessment: Diabetes control:   deteriorated Progress toward A1C goal:    Comments: patient has had some dietary indiscretions and has stopped all exercise since the passing of her sister  Plan: Medications:  continue current medications of metformin 1000mg  BID and glyburide 5mg  BID WC Home glucose monitoring: Frequency:   Timing:   Instruction/counseling given: reminded to bring blood glucose meter & log to each visit, reminded to bring medications to each visit, discussed foot care, discussed the need for weight loss and discussed diet Educational resources provided: brochure (pt does not need information on) Self management tools provided:   Other plans: will make referral to diabetes educator, and followup with patient in one month for discussion of possibly needing to start insulin

## 2013-12-06 NOTE — Progress Notes (Signed)
   Subjective:    Patient ID: Amanda Alexander, female    DOB: [birth year], 53 y.o.   MRN: [id number]  HPI Amanda Alexander is a 53 yo woman pmh as listed below presents for pain medication refill.  Pt would like to refill her tramadol medication for her back pain. She is not having any new symptoms and uses them only intermittently. She has not re-injured her back and is not having any numbness or tingling, bowel or bladder incontinence, urinary retention, weakness, or any ulcerations on the bottom of her feet.  The patient states that she does not have a meter and therefore has not been checking her blood sugars but has been consistently taking her metformin 1000 mg twice a day and the glyburide 5 mg twice a day. She has not noticed any hypoglycemic symptoms.  She is doing much better since the recent passing of her sister and is functioning and doing her ADLs and has a very positive attitude and outlook at this time.  Past Medical History  Diagnosis Date  . Hypertension   . Diabetes mellitus without complication   . OSA on CPAP    Current Outpatient Prescriptions on File Prior to Visit  Medication Sig Dispense Refill  . BABY ASPIRIN PO Take 81 mg by mouth every morning.      . glyBURIDE (DIABETA) 5 MG tablet TAKE 1 TABLET BY MOUTH TWICE A DAY WITH MEALS  60 tablet  1  . lisinopril-hydrochlorothiazide (PRINZIDE,ZESTORETIC) 20-25 MG per tablet Take 1 tablet by mouth daily.  90 tablet  6  . metFORMIN (GLUCOPHAGE) 1000 MG tablet Take 1 tablet (1,000 mg total) by mouth 2 (two) times daily with a meal.  180 tablet  3   No current facility-administered medications on file prior to visit.   Social, surgical, family history reviewed with patient and updated in appropriate chart locations.  Review of Systems Negative except as listed in HPI     Objective:   Physical Exam Filed Vitals:   12/06/13 1315  BP: 102/70  Pulse: 70  Temp: 97.6 F (36.4 C)   General: sitting in chair, NAD HEENT:  PERRL, EOMI, no scleral icterus Cardiac: RRR, no rubs, murmurs or gallops Pulm: clear to auscultation bilaterally, moving normal volumes of air Abd: soft, nontender, nondistended, BS present Ext: warm and well perfused, no pedal edema, FROM of back, 5/5 LE and UE strength, normal sensation, some paraspinal muscle tenderness in lumbar area Neuro: alert and oriented X3, cranial nerves II-XII grossly intact    Assessment & Plan:  Please see problem oriented charting  Pt discussed with Dr. Meredith PelJoines

## 2014-01-20 ENCOUNTER — Encounter: Payer: 59 | Admitting: Dietician

## 2014-01-20 ENCOUNTER — Encounter: Payer: Self-pay | Admitting: Dietician

## 2014-01-20 ENCOUNTER — Ambulatory Visit (INDEPENDENT_AMBULATORY_CARE_PROVIDER_SITE_OTHER): Payer: 59 | Admitting: Dietician

## 2014-01-20 VITALS — Wt 229.9 lb

## 2014-01-20 DIAGNOSIS — E119 Type 2 diabetes mellitus without complications: Secondary | ICD-10-CM

## 2014-01-20 NOTE — Patient Instructions (Signed)
Please make a follow up appointment for 1-2 weeks.  Check your blood sugar before and after you exercise at least one time and bring that with you to our next visit.   It would also be helpful for you to write down everything you eat and drink for a day before your next visit and bring this with you.  See you in 2 weeks!  Sincerely,  Lupita LeashDonna Makylie Rivere [phone]

## 2014-01-20 NOTE — Progress Notes (Signed)
Medical Nutrition Therapy:  Appt start time: 1405 end time:  1420.  Assessment:  Primary concerns today: Blood sugar control.  Patient was late to visit and had grandchildren waiting, so we agreed to keep it brief and she would schedule a follow up. She was surprised to hear of possibility of insulin therapy and says this motivates her to make changes to her food and activity.  Learning Readiness: Ready Barriers to learning/adherence to lifestyle change: balance of work and home life and care of self Usual eating pattern includes 2-3 meals and not sure at this time snacks per day. Usual physical activity includes walks once weekly. Self monitoring- we discussed using this to keep her blood sugar < 160- 180 most of the time by exercising and eating smaller portions.  Medications- notes and patient reports adherence to both the metformin and glyburide. A1C recently 9.8% Weight puts patient in class 3 obese category.  24-hr recall: to be done at future visit   Progress Towards Goal(s):  In progress.   Nutritional Diagnosis:  NB-1.1 Food and nutrition-related knowledge deficit As related to lack of prior exposure to diabetes self management training.  As evidenced by her report.    Intervention:  Nutrition education about what she needs to do, why she needs to do it and how she can decrease her blood sugars to increase her changes of delaying or forgoing insulin therapy. Handouts given during visit include:AVS Demonstrated degree of understanding via:  Teach Back   Monitoring/Evaluation:  Dietary intake, exercise, meter, and body weight in 2 week(s).

## 2014-03-18 ENCOUNTER — Other Ambulatory Visit: Payer: Self-pay | Admitting: *Deleted

## 2014-03-18 DIAGNOSIS — E119 Type 2 diabetes mellitus without complications: Secondary | ICD-10-CM

## 2014-03-19 ENCOUNTER — Other Ambulatory Visit: Payer: Self-pay | Admitting: *Deleted

## 2014-03-19 DIAGNOSIS — E119 Type 2 diabetes mellitus without complications: Secondary | ICD-10-CM

## 2014-03-19 DIAGNOSIS — I10 Essential (primary) hypertension: Secondary | ICD-10-CM

## 2014-03-20 MED ORDER — LISINOPRIL-HYDROCHLOROTHIAZIDE 20-25 MG PO TABS: 1.0000 | ORAL_TABLET | Freq: Every day | ORAL | Status: DC

## 2014-03-20 MED ORDER — GLYBURIDE 5 MG PO TABS: 5.0000 mg | ORAL_TABLET | Freq: Two times a day (BID) | ORAL | Status: DC

## 2014-03-20 MED ORDER — METFORMIN HCL 1000 MG PO TABS: 1000.0000 mg | ORAL_TABLET | Freq: Two times a day (BID) | ORAL | Status: DC

## 2014-03-28 ENCOUNTER — Encounter: Payer: 59 | Admitting: Internal Medicine

## 2014-04-11 ENCOUNTER — Observation Stay (HOSPITAL_COMMUNITY): Payer: 59

## 2014-04-11 ENCOUNTER — Encounter: Payer: Self-pay | Admitting: Internal Medicine

## 2014-04-11 ENCOUNTER — Ambulatory Visit (INDEPENDENT_AMBULATORY_CARE_PROVIDER_SITE_OTHER): Payer: 59 | Admitting: Internal Medicine

## 2014-04-11 ENCOUNTER — Ambulatory Visit (HOSPITAL_COMMUNITY)
Admission: RE | Admit: 2014-04-11 | Discharge: 2014-04-11 | Disposition: A | Discharge status: Home / Self Care | Payer: 59 | Source: Ambulatory Visit | Attending: Internal Medicine | Admitting: Internal Medicine

## 2014-04-11 ENCOUNTER — Observation Stay (HOSPITAL_COMMUNITY)
Admission: AD | Admit: 2014-04-11 | Discharge: 2014-04-13 | Disposition: A | Discharge status: Home / Self Care | Payer: 59 | Source: Ambulatory Visit | Attending: Internal Medicine | Admitting: Internal Medicine

## 2014-04-11 ENCOUNTER — Encounter (HOSPITAL_COMMUNITY): Payer: Self-pay | Admitting: Internal Medicine

## 2014-04-11 VITALS — BP 114/66 | HR 69 | Temp 97.8°F | Ht 60.0 in | Wt 226.4 lb

## 2014-04-11 DIAGNOSIS — IMO0001 Reserved for inherently not codable concepts without codable children: Secondary | ICD-10-CM

## 2014-04-11 DIAGNOSIS — G4733 Obstructive sleep apnea (adult) (pediatric): Secondary | ICD-10-CM | POA: Diagnosis not present

## 2014-04-11 DIAGNOSIS — E1165 Type 2 diabetes mellitus with hyperglycemia: Secondary | ICD-10-CM

## 2014-04-11 DIAGNOSIS — Z9989 Dependence on other enabling machines and devices: Secondary | ICD-10-CM

## 2014-04-11 DIAGNOSIS — R079 Chest pain, unspecified: Secondary | ICD-10-CM | POA: Diagnosis present

## 2014-04-11 DIAGNOSIS — I10 Essential (primary) hypertension: Secondary | ICD-10-CM | POA: Insufficient documentation

## 2014-04-11 DIAGNOSIS — Z79899 Other long term (current) drug therapy: Secondary | ICD-10-CM | POA: Diagnosis not present

## 2014-04-11 DIAGNOSIS — E119 Type 2 diabetes mellitus without complications: Secondary | ICD-10-CM | POA: Diagnosis not present

## 2014-04-11 DIAGNOSIS — R0789 Other chest pain: Secondary | ICD-10-CM

## 2014-04-11 LAB — CBC WITH DIFFERENTIAL/PLATELET
BASOS PCT: 0 % (ref 0–1)
Basophils Absolute: 0 10*3/uL (ref 0.0–0.1)
EOS PCT: 2 % (ref 0–5)
Eosinophils Absolute: 0.1 10*3/uL (ref 0.0–0.7)
HEMATOCRIT: 35.8 % — AB (ref 36.0–46.0)
Hemoglobin: 12.1 g/dL (ref 12.0–15.0)
Lymphocytes Relative: 38 % (ref 12–46)
Lymphs Abs: 2.4 10*3/uL (ref 0.7–4.0)
MCH: 29.4 pg (ref 26.0–34.0)
MCHC: 33.8 g/dL (ref 30.0–36.0)
MCV: 86.9 fL (ref 78.0–100.0)
MONO ABS: 0.5 10*3/uL (ref 0.1–1.0)
Monocytes Relative: 8 % (ref 3–12)
Neutro Abs: 3.3 10*3/uL (ref 1.7–7.7)
Neutrophils Relative %: 52 % (ref 43–77)
Platelets: 293 10*3/uL (ref 150–400)
RBC: 4.12 MIL/uL (ref 3.87–5.11)
RDW: 14.2 % (ref 11.5–15.5)
WBC: 6.3 10*3/uL (ref 4.0–10.5)

## 2014-04-11 LAB — COMPREHENSIVE METABOLIC PANEL
ALK PHOS: 89 U/L (ref 39–117)
ALT: 17 U/L (ref 0–35)
ANION GAP: 13 (ref 5–15)
AST: 12 U/L (ref 0–37)
Albumin: 3.4 g/dL — ABNORMAL LOW (ref 3.5–5.2)
BILIRUBIN TOTAL: 0.3 mg/dL (ref 0.3–1.2)
BUN: 14 mg/dL (ref 6–23)
CHLORIDE: 101 meq/L (ref 96–112)
CO2: 26 meq/L (ref 19–32)
Calcium: 9.2 mg/dL (ref 8.4–10.5)
Creatinine, Ser: 0.69 mg/dL (ref 0.50–1.10)
GLUCOSE: 216 mg/dL — AB (ref 70–99)
POTASSIUM: 3.8 meq/L (ref 3.7–5.3)
SODIUM: 140 meq/L (ref 137–147)
Total Protein: 7.1 g/dL (ref 6.0–8.3)

## 2014-04-11 LAB — GLUCOSE, CAPILLARY
GLUCOSE-CAPILLARY: 157 mg/dL — AB (ref 70–99)
Glucose-Capillary: 143 mg/dL — ABNORMAL HIGH (ref 70–99)
Glucose-Capillary: 260 mg/dL — ABNORMAL HIGH (ref 70–99)

## 2014-04-11 LAB — TROPONIN I: Troponin I: <0.3 ng/mL (ref <0.30)

## 2014-04-11 MED ORDER — ONDANSETRON HCL 4 MG/2ML IJ SOLN: 4.0000 mg | Freq: Four times a day (QID) | INTRAMUSCULAR | Status: DC | PRN

## 2014-04-11 MED ORDER — LISINOPRIL-HYDROCHLOROTHIAZIDE 20-25 MG PO TABS: 1.0000 | ORAL_TABLET | Freq: Every day | ORAL | Status: DC

## 2014-04-11 MED ORDER — HYDROCHLOROTHIAZIDE 25 MG PO TABS
25.0000 mg | ORAL_TABLET | Freq: Every day | ORAL | Status: DC
  Administered 2014-04-12 – 2014-04-13 (×2): 25 mg via ORAL
  Filled 2014-04-11 (×2): qty 1

## 2014-04-11 MED ORDER — INSULIN ASPART 100 UNIT/ML ~~LOC~~ SOLN
0.0000 [IU] | Freq: Three times a day (TID) | SUBCUTANEOUS | Status: DC
  Administered 2014-04-12: 3 [IU] via SUBCUTANEOUS
  Administered 2014-04-12 (×2): 5 [IU] via SUBCUTANEOUS
  Administered 2014-04-13: 11 [IU] via SUBCUTANEOUS
  Administered 2014-04-13: 8 [IU] via SUBCUTANEOUS

## 2014-04-11 MED ORDER — GI COCKTAIL ~~LOC~~: 30.0000 mL | Freq: Four times a day (QID) | ORAL | Status: DC | PRN

## 2014-04-11 MED ORDER — TRAMADOL HCL 50 MG PO TABS
50.0000 mg | ORAL_TABLET | Freq: Four times a day (QID) | ORAL | Status: DC | PRN
  Administered 2014-04-11 – 2014-04-12 (×3): 50 mg via ORAL
  Filled 2014-04-11 (×3): qty 1

## 2014-04-11 MED ORDER — METOPROLOL TARTRATE 25 MG PO TABS
25.0000 mg | ORAL_TABLET | Freq: Two times a day (BID) | ORAL | Status: DC
  Administered 2014-04-11 – 2014-04-13 (×4): 25 mg via ORAL
  Filled 2014-04-11 (×4): qty 1

## 2014-04-11 MED ORDER — ACETAMINOPHEN 325 MG PO TABS
650.0000 mg | ORAL_TABLET | ORAL | Status: DC | PRN
  Administered 2014-04-11: 650 mg via ORAL
  Filled 2014-04-11: qty 2

## 2014-04-11 MED ORDER — HEPARIN SODIUM (PORCINE) 5000 UNIT/ML IJ SOLN
5000.0000 [IU] | Freq: Three times a day (TID) | INTRAMUSCULAR | Status: DC
  Administered 2014-04-11 – 2014-04-13 (×6): 5000 [IU] via SUBCUTANEOUS
  Filled 2014-04-11 (×6): qty 1

## 2014-04-11 MED ORDER — LISINOPRIL 20 MG PO TABS
20.0000 mg | ORAL_TABLET | Freq: Every day | ORAL | Status: DC
  Administered 2014-04-12 – 2014-04-13 (×2): 20 mg via ORAL
  Filled 2014-04-11 (×2): qty 1

## 2014-04-11 MED ORDER — INSULIN ASPART 100 UNIT/ML ~~LOC~~ SOLN
0.0000 [IU] | Freq: Every day | SUBCUTANEOUS | Status: DC
  Administered 2014-04-11: 22:00:00 via SUBCUTANEOUS
  Administered 2014-04-12: 2 [IU] via SUBCUTANEOUS

## 2014-04-11 NOTE — Progress Notes (Signed)
Report called to Nurse on [address].  Patient is alert and oriented.  No c/o pain presently.  Patient transported via wheelchair to [address].  Angelina OkGladys Yaniyah Koors, RN 04/11/2014 5:21 PM.

## 2014-04-11 NOTE — Progress Notes (Signed)
Paged MD [name], advised that pt c/o back pain unrelieved by ordered Tylenol. Pt states she takes tramadol at home that is effective. MD will advise on course of action. Will continue to monitor.

## 2014-04-11 NOTE — H&P (Signed)
Date: 04/11/2014               Patient Name:  Amanda Alexander MRN: [id number]  DOB: [birth year] Age / Sex: 53 y.o., female   PCP: Christen BameNora Sadek, MD         Medical Service: Internal Medicine Teaching Service         Attending Physician: Dr. Earl LagosNischal Narendra, MD    First Contact: MS3 Alisa GraffMatt Finzen Pager: [phone]  Second Contact: Dr. Heywood Ilesushil Patel Pager: [phone]       After Hours (After 5p/  First Contact Pager: [phone]  weekends / holidays): Second Contact Pager: [phone]   Chief Complaint: chest pain  History of Present Illness: Amanda Alexander is a 53 yo AA female with poorly controlled DM2, HTN, OSA who presents today with chest pain.    She presented today to clinic today for a regular follow up appointment but complained of left sided chest pain.  She reports the pain is sharp, substernal, worsened by physical activity, and relieved within 1 minute or resting. She has been having this pain frequently, especially over the past 2 weeks. Her episodes of chest pain are not associated with eating.   She has previously had some relief with NTG but recently has run out. She takes ASA 81mg  and took one this morning. Otherwise, she reports she has been taking all of her medications although admits to taking double her normal metformin dose this morning (to "make her numbers good").  Meds: Current Facility-Administered Medications  Medication Dose Route Frequency Provider Last Rate Last Dose  . acetaminophen (TYLENOL) tablet 650 mg  650 mg Oral Q4H PRN Gust RungErik C Hoffman, DO   650 mg at 04/11/14 1934  . gi cocktail (Maalox,Lidocaine,Donnatal)  30 mL Oral QID PRN Gust RungErik C Hoffman, DO      . heparin injection 5,000 Units  5,000 Units Subcutaneous 3 times per day Gust RungErik C Hoffman, DO      . Melene Muller[START ON 04/12/2014] lisinopril (PRINIVIL,ZESTRIL) tablet 20 mg  20 mg Oral Daily Nischal Narendra, MD       And  . Melene Muller[START ON 04/12/2014] hydrochlorothiazide (HYDRODIURIL) tablet 25 mg  25 mg Oral Daily Nischal  Narendra, MD      . Melene Muller[START ON 04/12/2014] insulin aspart (novoLOG) injection 0-15 Units  0-15 Units Subcutaneous TID WC Gust RungErik C Hoffman, DO      . insulin aspart (novoLOG) injection 0-5 Units  0-5 Units Subcutaneous QHS Gust RungErik C Hoffman, DO      . metoprolol tartrate (LOPRESSOR) tablet 25 mg  25 mg Oral BID Gust RungErik C Hoffman, DO      . ondansetron [facility](ZOFRAN) injection 4 mg  4 mg Intravenous Q6H PRN Gust RungErik C Hoffman, DO        Allergies: Allergies as of 04/11/2014  . (No Known Allergies)   Past Medical History  Diagnosis Date  . Hypertension   . Diabetes mellitus without complication   . OSA on CPAP    Past Surgical History  Procedure Laterality Date  . Tonsillectomy Bilateral    Family History  Problem Relation Age of Onset  . Cancer Father    History   Social History  . Marital Status: Widowed    Spouse Name: N/A    Number of Children: N/A  . Years of Education: N/A   Occupational History  . Not on file.   Social History Main Topics  . Smoking status: Never Smoker   . Smokeless tobacco: Not on file  .  Alcohol Use: No  . Drug Use: No  . Sexual Activity: No   Other Topics Concern  . Not on file   Social History Narrative  . No narrative on file    Review of Systems: Low back pain: chronic, stable Right thigh pain  No dyspnea, heartburn  Physical Exam: Blood pressure 137/73, pulse 86, temperature 98 F (36.7 C), temperature source Oral, resp. rate 16, SpO2 99.00%.  General: resting in bed, NAD HEENT: PERRL, EOMI, no scleral icterus Cardiac: RRR, no rubs, murmurs or gallops Pulm: clear to auscultation bilaterally, no wheezes, rales, or rhonchi Abd: soft, nontender, nondistended, BS present Ext: warm and well perfused, trace 1+ pitting edema BLE Neuro: alert and oriented X3, cranial nerves II-XII grossly intact, strength and sensation to light touch equal in bilateral upper and lower extremities  CBG:  Recent Labs  04/11/14 1621 04/11/14 1837  GLUCAP 157*  143*    Imaging results:  Dg Chest 2 View  04/11/2014   CLINICAL DATA:  Chest pain for 1 week  EXAM: CHEST  2 VIEW  COMPARISON:  05/27/2008  FINDINGS: The heart size and mediastinal contours are within normal limits. Both lungs are clear. The visualized skeletal structures show degenerative change of the thoracic spine.  IMPRESSION: No active cardiopulmonary disease.   Electronically Signed   By: Alcide CleverMark  Lukens M.D.   On: 04/11/2014 19:03    Assessment & Plan by Problem:  Alexandria F Buckingham is a 53 yo AA female with poorly controlled DM2, HTN, OSA who presents today with chest pain.    Chest pain: Concerning for possible ACS. Heart Score pending troponins. TIMI score 3. EKG with nonspecific changes. - Admit to telemetry - Give 325mg  ASA - Start Metoprolol 25 BID until ACS ruled out - Consult Cardiology in AM if troponins are unremarkable for possible stress test; keep NPO after midnight -Trend troponins x 3 -Repeat EKG in AM -Obtain CMP, CXR  Poorly controlled DM2 without complications - Hold home medications - Check A1c - SSI-M  HTN (hypertension): Continue home BP meds  OSA on CPAP: Continue CPAP  #FEN:  -Diet: NPO after midnight  #DVT prophylaxis: heparin 5000 units subcutaneous  #CODE STATUS: FULL CODE  Dispo: Disposition is deferred at this time, awaiting improvement of current medical problems. Anticipated discharge in approximately 1-2 day(s).   The patient does have a current PCP [name], MD) and [facility] after [facility].  The patient does not have transportation limitations that hinder transportation to clinic appointments.  Signed: Heywood Ilesushil Patel, PGY1 Internal Medicine Pager: [phone]  @TODAY @ 8:02 PM

## 2014-04-11 NOTE — Progress Notes (Signed)
Subjective:   Patient ID: Amanda Alexander female   DOB: 10-16-1960 53 y.o.   MRN: [id number]  HPI: Amanda Alexander is a 53 y.o. woman pmh as listed below presents for  some chest pain, foot pain, and diabetes recheck.  In terms of the patient's chest pain she describes it as left-sided, sharp in nature associated with diaphoresis and sometimes nausea. The pain sometimes travels to the left shoulder. It iIs exacerbated by movement and relieved by rest or taking deep breaths. She used to have nitroglycerin but ran out of them given the frequency of the pain. She states that she is able to do less physical activity before the pain comes on and feels that she has less energy. 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 She states that sometimes these episodes have caused these tasks to become harder and she needs to take frequent breaks. She does continue to take her aspirin and blood pressure medications does not smoke or take other drugs. He denies any headache or visual changes and no lower extremity edema.  In terms of her diabetes she states that her blood sugars have recently just become uncontrolled with her meter consistently reading high. She has had some sick contacts at the elderly care facility and her grandsons. She denies any fever or chills and was self titrating her metformin taking 3 tablets a day which has caused some subsequent Diarrhea.   Past Medical History  Diagnosis Date  . Hypertension   . Diabetes mellitus without complication   . OSA on CPAP    Current Outpatient Prescriptions  Medication Sig Dispense Refill  . ACCU-CHEK FASTCLIX LANCETS MISC 1 Container by Does not apply route 2 (two) times daily.  102 each  0  . BABY ASPIRIN PO Take 81 mg by mouth every morning.      . Blood Glucose Monitoring Suppl (ACCU-CHEK NANO SMARTVIEW) W/DEVICE KIT 1 Device by Does not apply route once.  1 kit  0  . glucose blood (ACCU-CHEK  SMARTVIEW) test strip Diagnosis code 250.00  100 each  12  . glyBURIDE (DIABETA) 5 MG tablet Take 1 tablet (5 mg total) by mouth 2 (two) times daily with a meal.  180 tablet  1  . lisinopril-hydrochlorothiazide (PRINZIDE,ZESTORETIC) 20-25 MG per tablet Take 1 tablet by mouth daily.  90 tablet  6  . metFORMIN (GLUCOPHAGE) 1000 MG tablet Take 1 tablet (1,000 mg total) by mouth 2 (two) times daily with a meal.  180 tablet  3  . traMADol (ULTRAM) 50 MG tablet Take 1 tablet (50 mg total) by mouth every 6 (six) hours as needed.  30 tablet  2   No current facility-administered medications for this visit.   Family History  Problem Relation Age of Onset  . Cancer Father    History   Social History  . Marital Status: Widowed    Spouse Name: N/A    Number of Children: N/A  . Years of Education: N/A   Social History Main Topics  . Smoking status: Never Smoker   . Smokeless tobacco: None  . Alcohol Use: No  . Drug Use: No  . Sexual Activity: No   Other Topics Concern  . None   Social History Narrative  . None   Review of Systems: Pertinent items are noted in HPI. Objective:  Physical Exam: Filed Vitals:   04/11/14 1609  BP: 114/66  Pulse: 69  Temp: 97.8 F (36.6  C)  TempSrc: Oral  Height: 5' (1.524 m)  Weight: 226 lb 6.4 oz (102.694 kg)  SpO2: 100%   General: laying in bed, NAD HEENT: PERRL, EOMI, no scleral icterus Cardiac: RRR, no rubs, murmurs or gallops Pulm: clear to auscultation bilaterally, moving normal volumes of air Abd: soft, nontender, nondistended, BS present Ext: warm and well perfused, no pedal edema Neuro: alert and oriented X3, cranial nerves II-XII grossly intact  Assessment & Plan:  The patient's TIMI score is a 3 (> 3 CAD risks, ASA use, and severe angina in past 1 wk) and there is concerning symptoms of unstable angina. EKG was performed in clinic that showed some nonspecific ST changes more specifically T-wave depressions in V1 but no Q waves or ST  depressions or elevations. The patient's chest pain had resolved during this visit without intervention. It was decided that the patient should come in for stress test evaluation and was admitted to telemetry.  Pt seen and discussed with Dr. Beryle Beams

## 2014-04-11 NOTE — H&P (Signed)
Date: 04/11/2014               Patient Name:  Amanda GamblesCandace F Hale MRN: [id number]  DOB: 02/11/1961 Age / Sex: 53 y.o., female   PCP: Christen BameNora Sadek, MD              Medical Service: Internal Medicine Teaching Service              Attending Physician: Dr. Earl LagosNischal Narendra, MD    First Contact: Francina AmesMatthew Maxie Debose, MS3 Pager: [phone]  Second Contact: Dr. [name] Pager: [phone]  Third Contact Dr. Mikey BussingHoffman Pager: [phone]       After Hours (After 5p/  First Contact Pager: [phone]  weekends / holidays): Second Contact Pager: [phone]   Chief Complaint: Chest pain  History of Present Illness: Patient presented to clinic for routine follow up appointment and described symptoms of chest pain. She further complains of lower back pain and occasional right lateral thigh parethesia. She describes her chest pain as a severe substernal pain that is only present when performing physical activity, and that is relieved within a minute of resting. She states that this pain happens frequently however it was not concerning to her. She took her aspirin, lisinopril/HCTZ, and glyburide this morning as prescribed, and doubled her dose of metformin because she didn't want her blood glucose to be high at her doctor's appointment. She denies any symptoms of shortness of breath, heartburn. Her lower back pain is chronic and she has been prescribed tramadol in the past. Her right thigh paresthesia is exacerbated with activity.  Meds: Current Facility-Administered Medications  Medication Dose Route Frequency Provider Last Rate Last Dose  . acetaminophen (TYLENOL) tablet 650 mg  650 mg Oral Q4H PRN Gust RungErik C Hoffman, DO      . gi cocktail (Maalox,Lidocaine,Donnatal)  30 mL Oral QID PRN Gust RungErik C Hoffman, DO      . heparin injection 5,000 Units  5,000 Units Subcutaneous 3 times per day Gust RungErik C Hoffman, DO      . Melene Muller[START ON 04/12/2014] lisinopril (PRINIVIL,ZESTRIL) tablet 20 mg  20 mg Oral Daily Nischal Narendra, MD       And  . Melene Muller[START ON  04/12/2014] hydrochlorothiazide (HYDRODIURIL) tablet 25 mg  25 mg Oral Daily Nischal Narendra, MD      . Melene Muller[START ON 04/12/2014] insulin aspart (novoLOG) injection 0-15 Units  0-15 Units Subcutaneous TID WC Gust RungErik C Hoffman, DO      . insulin aspart (novoLOG) injection 0-5 Units  0-5 Units Subcutaneous QHS Gust RungErik C Hoffman, DO      . metoprolol tartrate (LOPRESSOR) tablet 25 mg  25 mg Oral BID Gust RungErik C Hoffman, DO      . ondansetron [facility](ZOFRAN) injection 4 mg  4 mg Intravenous Q6H PRN Gust RungErik C Hoffman, DO        Allergies: Allergies as of 04/11/2014  . (No Known Allergies)   Past Medical History  Diagnosis Date  . Hypertension   . Diabetes mellitus without complication   . OSA on CPAP    Past Surgical History  Procedure Laterality Date  . Tonsillectomy Bilateral    Family History  Problem Relation Age of Onset  . Cancer Father    History   Social History  . Marital Status: Widowed    Spouse Name: N/A    Number of Children: N/A  . Years of Education: N/A   Occupational History  . Not on file.   Social History Main Topics  . Smoking status: Never Smoker   .  Smokeless tobacco: Not on file  . Alcohol Use: No  . Drug Use: No  . Sexual Activity: No   Other Topics Concern  . Not on file   Social History Narrative  . No narrative on file   Review of Systems: Negative except as described in the HPI  Physical Exam: Blood pressure 137/73, pulse 86, temperature 98 F (36.7 C), temperature source Oral, resp. rate 16, SpO2 99.00%.  General: Laying in bed AAOx3, NAD, pleasant demeanor HENT: Normocephalic, atraumatic Eyes: EOMI, no icterus Neck: Supple, no LAD CV: RRR, normal S1 and S2, no murmurs, rubs or gallops Pulm: CTAB, normal work of breathing Abd: S/NT/ND, +BS Ext: no pitting edema, warm and well perfused  Lab results: Pending CMP, CBC, troponins  Imaging results:  No results found.  Other results: EKG: pending read  Assessment & Plan by Problem: Tylin Penner is  a 53 year old female who presents with chest pain and a past medical history of DM2, HTN, and OSA on CPAP.  Chest Pain: Stable angina v acute MI. Stable angina appears to be the likely diagnosis given clinical history of exertional chest pain relieved by rest, history of these symptoms per patient report. TIMI score 3, HEART score pending troponin. -Telemetry -F/U EKG read (prelim read NSR, normal EKG) and repeat EKG tomorrow AM -F/U troponins -F/U CMP and CBC -Stress test and echo tomorrow AM  HTN: -Home lisinopril/HCTZ, metoprolol  Diabetes: Elevated blood glucose on presentation. -SSI -Hold home metformin and glyburide  Lower Back Pain and Right Thigh Pain: -Tylenol -F/U as outpatient  OSA: -CPAP  FENGI: -Carb modified -GI cocktail -Zofran prn -NPO at midnight  PPX: -SCDs  This is a Psychologist, occupationalMedical Student Note.  The care of the patient was discussed with Dr. [name] and the assessment and plan was formulated with their assistance.  Please see their note for official documentation of the patient encounter.   Signed: Francina AmesMatthew Chasta Deshpande, Med Student 04/11/2014, 6:10 PM

## 2014-04-12 DIAGNOSIS — R079 Chest pain, unspecified: Secondary | ICD-10-CM | POA: Diagnosis not present

## 2014-04-12 DIAGNOSIS — E1165 Type 2 diabetes mellitus with hyperglycemia: Secondary | ICD-10-CM

## 2014-04-12 DIAGNOSIS — G4733 Obstructive sleep apnea (adult) (pediatric): Secondary | ICD-10-CM

## 2014-04-12 DIAGNOSIS — I10 Essential (primary) hypertension: Secondary | ICD-10-CM

## 2014-04-12 LAB — HEMOGLOBIN A1C
Hgb A1c MFr Bld: 10.2 % — ABNORMAL HIGH (ref <5.7)
Mean Plasma Glucose: 246 mg/dL — ABNORMAL HIGH (ref <117)

## 2014-04-12 LAB — GLUCOSE, CAPILLARY
GLUCOSE-CAPILLARY: 243 mg/dL — AB (ref 70–99)
GLUCOSE-CAPILLARY: 187 mg/dL — AB (ref 70–99)
Glucose-Capillary: 223 mg/dL — ABNORMAL HIGH (ref 70–99)
Glucose-Capillary: 208 mg/dL — ABNORMAL HIGH (ref 70–99)

## 2014-04-12 LAB — TROPONIN I
Troponin I: <0.3 ng/mL (ref <0.30)
Troponin I: <0.3 ng/mL (ref <0.30)

## 2014-04-12 LAB — HIV ANTIBODY (ROUTINE TESTING W REFLEX): HIV 1&2 Ab, 4th Generation: NONREACTIVE

## 2014-04-12 NOTE — H&P (Signed)
Pt seen and examined with Dr. [name]. Please refer to resident note for details  In brief, pt is a 53 y/o female with PMH of DM, HTN, OSA who p/w CP *  2 weeks. CP is intermittent, worsened with exertion and relieved with rest, non radiating, sub sternal, sharp in nature. Pt denies SOB. No palpitations, no diaphoresis, no n/v, no abd pain, no lightheadednes, no syncope. [facility]  Exam: Gen: AAO*3, NAD CVS: RRR, normal heart sounds Pulm: CTA b/l Abd: soft, non tender, BS + Ext: no pedal edema  Assessment and Plan: 53 y/o female with CP concerning for angina  CP: - troponins negative - EKG with no acute ST/T wave changes - Cardio f/u noted- will check myocardial perfusion scan to r/o ischemia - c/w asa, metoprolol - f/u ECHO  DM: - Pt with poorly controlled DM - c/w SSI for now. Would consider lantus on d/c with metformin

## 2014-04-12 NOTE — Progress Notes (Signed)
Subjective: Daughter was at bedside this AM. She complains of not being able to eat but is otherwise all right.  Objective: Vital signs in last 24 hours: Filed Vitals:   04/12/14 0026 04/12/14 0400 04/12/14 0427 04/12/14 0826  BP: 110/55 114/62 114/62 108/60  Pulse: 63 58 56   Temp:  98 F (36.7 C)    TempSrc:      Resp:  20    Height:      Weight:  225 lb 12.8 oz (102.422 kg)    SpO2: 98% 99% 98%    Weight change:   Intake/Output Summary (Last 24 hours) at 04/12/14 1304 Last data filed at 04/12/14 0415  Gross per 24 hour  Intake    240 ml  Output   1700 ml  Net  -1460 ml   General: resting in bed, NAD  HEENT: PERRL, EOMI, no scleral icterus  Cardiac: RRR, no rubs, murmurs or gallops  Pulm: clear to auscultation bilaterally, no wheezes, rales, or rhonchi  Abd: soft, nontender, nondistended, BS present  Ext: warm and well perfused, trace 1+ pitting edema BLE stable from yesterday Neuro: alert and oriented X3, cranial nerves II-XII grossly intact, strength and sensation to light touch equal in bilateral upper and lower extremities  Lab Results: Basic Metabolic Panel:  Recent Labs Lab 04/11/14 2000  NA 140  K 3.8  CL 101  CO2 26  GLUCOSE 216*  BUN 14  CREATININE 0.69  CALCIUM 9.2   Liver Function Tests:  Recent Labs Lab 04/11/14 2000  AST 12  ALT 17  ALKPHOS 89  BILITOT 0.3  PROT 7.1  ALBUMIN 3.4*   CBC:  Recent Labs Lab 04/11/14 2000  WBC 6.3  NEUTROABS 3.3  HGB 12.1  HCT 35.8*  MCV 86.9  PLT 293   Cardiac Enzymes:  Recent Labs Lab 04/11/14 2000 04/12/14 0130 04/12/14 1015  TROPONINI <0.30 <0.30 <0.30   CBG:  Recent Labs Lab 04/11/14 1621 04/11/14 1837 04/11/14 2037 04/12/14 0747 04/12/14 1153  GLUCAP 157* 143* 260* 243* 187*   Studies/Results: Dg Chest 2 View  04/11/2014   CLINICAL DATA:  Chest pain for 1 week  EXAM: CHEST  2 VIEW  COMPARISON:  05/27/2008  FINDINGS: The heart size and mediastinal contours are within  normal limits. Both lungs are clear. The visualized skeletal structures show degenerative change of the thoracic spine.  IMPRESSION: No active cardiopulmonary disease.   Electronically Signed   By: Alcide CleverMark  Lukens M.D.   On: 04/11/2014 19:03   Medications: I have reviewed the patient's current medications. Scheduled Meds: . heparin  5,000 Units Subcutaneous 3 times per day  . lisinopril  20 mg Oral Daily   And  . hydrochlorothiazide  25 mg Oral Daily  . insulin aspart  0-15 Units Subcutaneous TID WC  . insulin aspart  0-5 Units Subcutaneous QHS  . metoprolol tartrate  25 mg Oral BID   Continuous Infusions:  PRN Meds:.acetaminophen, gi cocktail, ondansetron (ZOFRAN) IV, traMADol Assessment/Plan: Principal Problem:   Chest pain Active Problems:   Uncontrolled diabetes mellitus type 2 without complications   HTN (hypertension)   OSA on CPAP  Amanda Alexander is a 53 yo AA female with poorly controlled DM2, HTN, OSA hospitalized for stable angina.   Stable angina: Symptoms consistent with cardiac chest pain. EKG and troponins reassuring for no MI.  -Consult cardiology for EKG stress test -Keep NPO after midnight -Continue metoprolol 25mg  BID -Continue ASA 325mg  -Check lipid panel  -Follow-up  echo  Poorly controlled DM2 without complications: A1c 10.2, trending upwards since 2009.  -Continue SSI-M -Consider stopping glyburide and starting insulin at time of discharge  HTN (hypertension): Continue home BP meds   OSA on CPAP: Continue CPAP   #FEN:  -Diet: Carb Modified/Heart Healthy; NPO after midnight   #DVT prophylaxis: heparin 5000 units subcutaneous   #CODE STATUS: FULL CODE   Dispo: Disposition is deferred at this time, awaiting improvement of current medical problems.  Anticipated discharge in approximately 1-2 day(s).   The patient does have a current PCP [name], MD) and [facility] after discharge.  The patient does not know  have transportation limitations that hinder transportation to clinic appointments.  .Services Needed at time of discharge: Y = Yes, Blank = No PT:   OT:   RN:   Equipment:   Other:     LOS: 1 day   Heywood Ilesushil Patel, MD 04/12/2014, 1:04 PM

## 2014-04-12 NOTE — Progress Notes (Signed)
UR completed 

## 2014-04-12 NOTE — Progress Notes (Signed)
Patient sets up set on CPAP, machine water filled and ready to go. No issues. Will continue to monitor for any assistance needed.

## 2014-04-12 NOTE — Consult Note (Signed)
Name: Amanda Alexander is a 53 y.o. female Admit date: 04/11/2014 Referring Physician:  Internal medicine teaching service Primary Physician:  Clinton Gallant, M.D. Primary Cardiologist:  Helyn Numbers, M.D.  Reason for Consultation:  Chest pain  ASSESSMENT: 1. Chest pain, atypical for angina but with multiple risk factors 2. Diabetes mellitus with poor control and complications 3. Hypertension, poor control 4. Obstructive sleep apnea  PLAN:  Pharmacologic myocardial perfusion study in a.m.   HPI: 53 year old female with atypical chest discomfort that she describes as substernal, sharp, and lasting minutes. The discomfort occurs at random. It is not associated with specific activities. She denies orthopnea and PND. She has not had syncope. She denies claudication. No neurological complaints. No history of vascular disease. Never smoker.  PMH:   Past Medical History  Diagnosis Date  . Hypertension   . Diabetes mellitus without complication   . OSA on CPAP     PSH:   Past Surgical History  Procedure Laterality Date  . Tonsillectomy Bilateral    Allergies:  Review of patient's allergies indicates no known allergies. Prior to Admit Meds:   Prescriptions prior to admission  Medication Sig Dispense Refill  . aspirin EC 81 MG tablet Take 81 mg by mouth daily.      Marland Kitchen glyBURIDE (DIABETA) 5 MG tablet Take 1 tablet (5 mg total) by mouth 2 (two) times daily with a meal.  180 tablet  1  . lisinopril-hydrochlorothiazide (PRINZIDE,ZESTORETIC) 20-25 MG per tablet Take 1 tablet by mouth daily.  90 tablet  6  . metFORMIN (GLUCOPHAGE) 1000 MG tablet Take 1 tablet (1,000 mg total) by mouth 2 (two) times daily with a meal.  180 tablet  3  . Pseudoephedrine-APAP-DM (TYLENOL COLD/FLU SEVERE DAY PO) Take 1 capsule by mouth once.      . traMADol (ULTRAM) 50 MG tablet Take 50 mg by mouth every 6 (six) hours as needed (pain).      Marland Kitchen ACCU-CHEK FASTCLIX LANCETS MISC 1 Container by Does not apply route  2 (two) times daily.  102 each  0  . Blood Glucose Monitoring Suppl (ACCU-CHEK NANO SMARTVIEW) W/DEVICE KIT 1 Device by Does not apply route once.  1 kit  0  . glucose blood (ACCU-CHEK SMARTVIEW) test strip Diagnosis code 250.00  100 each  12   Fam HX:    Family History  Problem Relation Age of Onset  . Cancer Father    Social HX:    History   Social History  . Marital Status: Widowed    Spouse Name: N/A    Number of Children: N/A  . Years of Education: N/A   Occupational History  . Not on file.   Social History Main Topics  . Smoking status: Never Smoker   . Smokeless tobacco: Not on file  . Alcohol Use: No  . Drug Use: No  . Sexual Activity: No   Other Topics Concern  . Not on file   Social History Narrative  . No narrative on file     Review of Systems: Diabetes mellitus for greater than 5 years. No history of asthma or lung disease. No recent dental work or concerns.  Physical Exam: Blood pressure 108/60, pulse 56, temperature 98 F (36.7 C), temperature source Oral, resp. rate 20, height 5' (1.524 m), weight 225 lb 12.8 oz (102.422 kg), SpO2 98.00%. Weight change:    Obese but in no distress, lying comfortably in bed with family murmurs Chest is clear to  auscultation and percussion Cardiac exam reveals no gallop, rub, click, or murmur. Neck exam reveals no JVD or carotid bruits. Carotid upstroke is 2+ Extremities reveal no edema. Abdomen is obese, nontender, and without bruits Neurological exam is normal  Labs: Lab Results  Component Value Date   WBC 6.3 04/11/2014   HGB 12.1 04/11/2014   HCT 35.8* 04/11/2014   MCV 86.9 04/11/2014   PLT 293 04/11/2014    Recent Labs Lab 04/11/14 2000  NA 140  K 3.8  CL 101  CO2 26  BUN 14  CREATININE 0.69  CALCIUM 9.2  PROT 7.1  BILITOT 0.3  ALKPHOS 89  ALT 17  AST 12  GLUCOSE 216*    Lab Results  Component Value Date   TROPONINI <0.30 04/12/2014     Lab Results  Component Value Date   CHOL 174  03/08/2013   Lab Results  Component Value Date   HDL 54 03/08/2013   Lab Results  Component Value Date   LDLCALC 90 03/08/2013   Lab Results  Component Value Date   TRIG 150* 03/08/2013   Lab Results  Component Value Date   CHOLHDL 3.2 03/08/2013   No results found for this basename: LDLDIRECT      Radiology:  Dg Chest 2 View  04/11/2014   CLINICAL DATA:  Chest pain for 1 week  EXAM: CHEST  2 VIEW  COMPARISON:  05/27/2008  FINDINGS: The heart size and mediastinal contours are within normal limits. Both lungs are clear. The visualized skeletal structures show degenerative change of the thoracic spine.  IMPRESSION: No active cardiopulmonary disease.   Electronically Signed   By: Inez Catalina M.D.   On: 04/11/2014 19:03    EKG:  Normal sinus rhythm with overall normal appearance    Sinclair Grooms 04/12/2014 3:09 PM

## 2014-04-12 NOTE — Progress Notes (Signed)
Subjective: Patient is resting comfortably, no acute distress. Objective: Vital signs in last 24 hours: Filed Vitals:   04/12/14 0026 04/12/14 0400 04/12/14 0427 04/12/14 0826  BP: 110/55 114/62 114/62 108/60  Pulse: 63 58 56   Temp:  98 F (36.7 C)    TempSrc:      Resp:  20    Height:      Weight:  102.422 kg (225 lb 12.8 oz)    SpO2: 98% 99% 98%    Weight change:   Intake/Output Summary (Last 24 hours) at 04/12/14 0928 Last data filed at 04/12/14 0415  Gross per 24 hour  Intake    240 ml  Output   1700 ml  Net  -1460 ml   General: AAOx3, NAD CV: RRR, normal S1 and S2 Pulm: CTAB, NWB Abd: Obese, S/NT/ND Ext: no pitting edema  Lab Results: Results for Vernard GamblesSIDES, Sharnette F (MRN [id number]) as of 04/12/2014 09:33  Ref. Range 04/11/2014 20:00  Sodium Latest Range: 137-147 mEq/L 140  Potassium Latest Range: 3.7-5.3 mEq/L 3.8  Chloride Latest Range: 96-112 mEq/L 101  CO2 Latest Range: 19-32 mEq/L 26  Mean Plasma Glucose Latest Range: <117 mg/dL 782246 (H)  BUN Latest Range: 6-23 mg/dL 14  Creatinine Latest Range: 0.50-1.10 mg/dL 9.560.69  Calcium Latest Range: 8.4-10.5 mg/dL 9.2  GFR calc non Af Amer Latest Range: >90 mL/min >90  GFR calc Af Amer Latest Range: >90 mL/min >90  Glucose Latest Range: 70-99 mg/dL 213216 (H)  Anion gap Latest Range: 5-15  13  Alkaline Phosphatase Latest Range: 39-117 U/L 89  Albumin Latest Range: 3.5-5.2 g/dL 3.4 (L)  AST Latest Range: 0-37 U/L 12  ALT Latest Range: 0-35 U/L 17  Total Protein Latest Range: 6.0-8.3 g/dL 7.1  Total Bilirubin Latest Range: 0.3-1.2 mg/dL 0.3  Troponin I Latest Range: <0.30 ng/mL <0.30  WBC Latest Range: 4.0-10.5 K/uL 6.3  RBC Latest Range: 3.87-5.11 MIL/uL 4.12  Hemoglobin Latest Range: 12.0-15.0 g/dL 08.612.1  HCT Latest Range: 36.0-46.0 % 35.8 (L)  MCV Latest Range: 78.0-100.0 fL 86.9  MCH Latest Range: 26.0-34.0 pg 29.4  MCHC Latest Range: 30.0-36.0 g/dL 57.833.8  RDW Latest Range: 11.5-15.5 % 14.2  Platelets Latest  Range: 150-400 K/uL 293  Neutrophils Relative % Latest Range: 43-77 % 52  Lymphocytes Relative Latest Range: 12-46 % 38  Monocytes Relative Latest Range: 3-12 % 8  Eosinophils Relative Latest Range: 0-5 % 2  Basophils Relative Latest Range: 0-1 % 0  NEUT# Latest Range: 1.7-7.7 K/uL 3.3  Lymphocytes Absolute Latest Range: 0.7-4.0 K/uL 2.4  Monocytes Absolute Latest Range: 0.1-1.0 K/uL 0.5  Eosinophils Absolute Latest Range: 0.0-0.7 K/uL 0.1  Basophils Absolute Latest Range: 0.0-0.1 K/uL 0.0  Hemoglobin A1C Latest Range: <5.7 % 10.2 (H)   Micro Results: No results found for this or any previous visit (from the past 240 hour(s)). Studies/Results: Dg Chest 2 View  04/11/2014   CLINICAL DATA:  Chest pain for 1 week  EXAM: CHEST  2 VIEW  COMPARISON:  05/27/2008  FINDINGS: The heart size and mediastinal contours are within normal limits. Both lungs are clear. The visualized skeletal structures show degenerative change of the thoracic spine.  IMPRESSION: No active cardiopulmonary disease.   Electronically Signed   By: Alcide CleverMark  Lukens M.D.   On: 04/11/2014 19:03   Medications: Scheduled Meds: . heparin  5,000 Units Subcutaneous 3 times per day  . lisinopril  20 mg Oral Daily   And  . hydrochlorothiazide  25 mg Oral Daily  .  insulin aspart  0-15 Units Subcutaneous TID WC  . insulin aspart  0-5 Units Subcutaneous QHS  . metoprolol tartrate  25 mg Oral BID   Continuous Infusions:  PRN Meds:.acetaminophen, gi cocktail, ondansetron (ZOFRAN) IV, traMADol Assessment/Plan: Amanda MastersCandace Tello is a 53 year old female who presents with chest pain and a past medical history of DM2, HTN, and OSA on CPAP.   Chest Pain: Stable angina v acute MI. Stable angina appears to be the likely diagnosis given clinical history of exertional chest pain relieved by rest, history of these symptoms per patient report. TIMI score 3, HEART score 3.  -Telemetry  -F/U EKG AM (ED EKG normal)  -F/U troponins (negative x2) -F/U  CMP and CBC  -Stress test and echo tomorrow AM   HTN:  -Home lisinopril/HCTZ, metoprolol   Diabetes: Elevated blood glucose on presentation.  -SSI  -Hold home metformin and glyburide   Lower Back Pain and Right Thigh Pain:  -Tramadol 50mg  -F/U as outpatient   OSA:  -CPAP   FENGI:  -Carb modified  -GI cocktail  -Zofran prn  -NPO at midnight   PPX:  -SCDs  This is a Psychologist, occupationalMedical Student Note.  The care of the patient was discussed with Dr. [name] and the assessment and plan formulated with their assistance.  Please see their attached note for official documentation of the daily encounter.   LOS: 1 day   Francina AmesMatthew Lyndsy Gilberto, Med Student 04/12/2014, 9:28 AM

## 2014-04-13 ENCOUNTER — Observation Stay (HOSPITAL_COMMUNITY): Payer: 59

## 2014-04-13 ENCOUNTER — Other Ambulatory Visit: Payer: Self-pay

## 2014-04-13 DIAGNOSIS — R079 Chest pain, unspecified: Secondary | ICD-10-CM | POA: Diagnosis not present

## 2014-04-13 DIAGNOSIS — R072 Precordial pain: Secondary | ICD-10-CM

## 2014-04-13 DIAGNOSIS — I517 Cardiomegaly: Secondary | ICD-10-CM

## 2014-04-13 LAB — LIPID PANEL
CHOLESTEROL: 157 mg/dL (ref 0–200)
HDL: 47 mg/dL (ref >39)
LDL Cholesterol: 82 mg/dL (ref 0–99)
Total CHOL/HDL Ratio: 3.3 RATIO
Triglycerides: 140 mg/dL (ref <150)
VLDL: 28 mg/dL (ref 0–40)

## 2014-04-13 LAB — GLUCOSE, CAPILLARY
Glucose-Capillary: 262 mg/dL — ABNORMAL HIGH (ref 70–99)
Glucose-Capillary: 325 mg/dL — ABNORMAL HIGH (ref 70–99)

## 2014-04-13 MED ORDER — TRAMADOL HCL 50 MG PO TABS: 50.0000 mg | ORAL_TABLET | Freq: Four times a day (QID) | ORAL | Status: DC | PRN

## 2014-04-13 MED ORDER — TECHNETIUM TC 99M SESTAMIBI GENERIC - CARDIOLITE
10.0000 | Freq: Once | INTRAVENOUS | Status: AC | PRN
  Administered 2014-04-13: 10 via INTRAVENOUS

## 2014-04-13 MED ORDER — TECHNETIUM TC 99M SESTAMIBI - CARDIOLITE
30.0000 | Freq: Once | INTRAVENOUS | Status: AC | PRN
  Administered 2014-04-13: 30 via INTRAVENOUS

## 2014-04-13 MED ORDER — PANTOPRAZOLE SODIUM 40 MG PO TBEC: 40.0000 mg | DELAYED_RELEASE_TABLET | Freq: Every day | ORAL | Status: DC

## 2014-04-13 MED ORDER — REGADENOSON 0.4 MG/5ML IV SOLN
INTRAVENOUS | Status: AC
  Administered 2014-04-13: 0.4 mg via INTRAVENOUS
  Filled 2014-04-13: qty 5

## 2014-04-13 MED ORDER — REGADENOSON 0.4 MG/5ML IV SOLN
0.4000 mg | Freq: Once | INTRAVENOUS | Status: AC
  Administered 2014-04-13: 0.4 mg via INTRAVENOUS
  Filled 2014-04-13: qty 5

## 2014-04-13 NOTE — Plan of Care (Signed)
Problem: Phase II Progression Outcomes Goal: Hemodynamically stable Outcome: Completed/Met Date Met:  04/13/14 Goal: Anginal pain relieved Outcome: Completed/Met Date Met:  04/13/14 Goal: Stress Test if indicated Outcome: Not Applicable Date Met:  65/53/74 Goal: Cath/PCI Day Path if indicated Outcome: Not Applicable Date Met:  82/70/78 Goal: CV Risk Factors identified Outcome: Not Applicable Date Met:  67/54/49 Goal: Cardiac Rehab if ordered Outcome: Not Applicable Date Met:  20/10/07 Goal: If positive for MI, change to MI Path Outcome: Not Applicable Date Met:  05/31/74 Goal: Other Phase II Outcomes/Goals Outcome: Completed/Met Date Met:  04/13/14

## 2014-04-13 NOTE — Progress Notes (Signed)
  Echocardiogram 2D Echocardiogram has been performed.  Amanda Alexander, Amanda Alexander 04/13/2014, 2:09 PM

## 2014-04-13 NOTE — Discharge Instructions (Signed)
Blood Glucose Monitoring °Monitoring your blood glucose (also know as blood sugar) helps you to manage your diabetes. It also helps you and your health care provider monitor your diabetes and determine how well your treatment plan is working. °WHY SHOULD YOU MONITOR YOUR BLOOD GLUCOSE? °· It can help you understand how food, exercise, and medicine affect your blood glucose. °· It allows you to know what your blood glucose is at any given moment. You can quickly tell if you are having low blood glucose (hypoglycemia) or high blood glucose (hyperglycemia). °· It can help you and your health care provider know how to adjust your medicines. °· It can help you understand how to manage an illness or adjust medicine for exercise. °WHEN SHOULD YOU TEST? °Your health care provider will help you decide how often you should check your blood glucose. This may depend on the type of diabetes you have, your diabetes control, or the types of medicines you are taking. Be sure to write down all of your blood glucose readings so that this information can be reviewed with your health care provider. See below for examples of testing times that your health care provider may suggest. °Type 1 Diabetes °· Test 4 times a day if you are in good control, using an insulin pump, or perform multiple daily injections. °· If your diabetes is not well controlled or if you are sick, you may need to monitor more often. °· It is a good idea to also monitor: °¨ Before and after exercise. °¨ Between meals and 2 hours after a meal. °¨ Occasionally between 2:00 a.m. and 3:00 a.m. °Type 2 Diabetes °· It can vary with each person, but generally, if you are on insulin, test 4 times a day. °· If you take medicines by mouth (orally), test 2 times a day. °· If you are on a controlled diet, test once a day. °· If your diabetes is not well controlled or if you are sick, you may need to monitor more often. °HOW TO MONITOR YOUR BLOOD GLUCOSE °Supplies  Needed °· Blood glucose meter. °· Test strips for your meter. Each meter has its own strips. You must use the strips that go with your own meter. °· A pricking needle (lancet). °· A device that holds the lancet (lancing device). °· A journal or log book to write down your results. °Procedure °· Wash your hands with soap and water. Alcohol is not preferred. °· Prick the side of your finger (not the tip) with the lancet. °· Gently milk the finger until a small drop of blood appears. °· Follow the instructions that come with your meter for inserting the test strip, applying blood to the strip, and using your blood glucose meter. °Other Areas to Get Blood for Testing °Some meters allow you to use other areas of your body (other than your finger) to test your blood. These areas are called alternative sites. The most common alternative sites are: °· The forearm. °· The thigh. °· The back area of the lower leg. °· The palm of the hand. °The blood flow in these areas is slower. Therefore, the blood glucose values you get may be delayed, and the numbers are different from what you would get from your fingers. Do not use alternative sites if you think you are having hypoglycemia. Your reading will not be accurate. Always use a finger if you are having hypoglycemia. Also, if you cannot feel your lows (hypoglycemia unawareness), always use your fingers for your   blood glucose checks. ADDITIONAL TIPS FOR GLUCOSE MONITORING  Do not reuse lancets.  Always carry your supplies with you.  All blood glucose meters have a 24-hour "hotline" number to call if you have questions or need help.  Adjust (calibrate) your blood glucose meter with a control solution after finishing a few boxes of strips. BLOOD GLUCOSE RECORD KEEPING It is a good idea to keep a daily record or log of your blood glucose readings. Most glucose meters, if not all, keep your glucose records stored in the meter. Some meters come with the ability to download  your records to your home computer. Keeping a record of your blood glucose readings is especially helpful if you are wanting to look for patterns. Make notes to go along with the blood glucose readings because you might forget what happened at that exact time. Keeping good records helps you and your health care provider to work together to achieve good diabetes management.  Document Released: 06/02/2003 Document Revised: 10/14/2013 Document Reviewed: 10/22/2012 [facility] Patient Information 2015 [place], [place]. This information is not intended to replace advice given to you by your health care provider. Make sure you discuss any questions you have with your health care provider.   Cardiac Diet A cardiac diet can help stop heart disease or a stroke from happening. It involves eating less unhealthy fats and eating more healthy fats.  FOODS TO AVOID OR LIMIT  Limit saturated fats. This type of fat is found in oils and dairy products, such as:  Coconut oil.  Palm oil.  Cocoa butter.  Butter.  Avoid trans-fat or hydrogenated oils. These are found in fried or pre-made baked goods, such as:  Margarine.  Pre-made cookies, cakes, and crackers.  Limit processed meats (hot dogs, deli meats, sausage) to 3 ounces a week.  Limit high-fat meats (marbled meats, fried chicken, or chicken with skin) to 3 ounces a week.  Limit salt (sodium) to 1500 milligrams a day.   Limit sweets and drinks with added sugar to no more than 5 servings a week. One serving is:  1 tablespoon of sugar.  1 tablespoon of jelly or jam.   cup sorbet.  1 cup lemonade.   cup regular soda. EAT MORE OF THE FOLLOWING FOODS Fruit  Eat 4to 5 servings a day. One serving of fruit is:  1 medium whole fruit.   cup dried fruit.   cup of fresh, frozen, or canned fruit.   cup 100% fruit juice. Vegetables  Eat 4 to 5 servings a day. One serving is:  1 cup raw leafy vegetables.   cup raw or cooked, cut-up  vegetables.   cup vegetable juice. Whole Grains  Eat 3 servings a day (1 ounce equals 1 serving). Legumes (such as beans, peas, and lentils)   Eat at least 4 servings a week ( cup equals 1 serving). Nuts and Seeds   Eat at least 4 servings a week ( cup equals 1 serving). Dietary Fiber  Eat 20 to 30 grams a day. Some foods high in dietary fiber include:  Dried beans.  Citrus fruits.  Apples, bananas.  Broccoli, Brussels sprouts, and eggplant.  Oats. Omega-3 Fats  Eat food with omega-3 fats. You can also take a dietary pill (supplement) that has 1 gram of DHA and EPA. Have 3.5 ounces of fatty fish a week, such as:  Salmon.  Mackerel.  [place] tuna.  Sardines.  Lake trout.  Herring. PREPARING YOUR FOOD  Broil, bake, steam, or roast foods.  Do not fry food. Do not cook food in butter (fat).  Use non-stick cooking sprays.  Remove skin from poultry, such as chicken and [place].  Remove fat from meat.  Take the fat off the top of stews, soups, and gravy.  Use lemon or herbs to flavor food instead of using butter or margarine.  Use nonfat yogurt, salsa, or low-fat dressings for salads. Document Released: 11/29/2011 Document Reviewed: 11/29/2011 [facility] Patient Information 2015 [place], [place]. This information is not intended to replace advice given to you by your health care provider. Make sure you discuss any questions you have with your health care provider.

## 2014-04-13 NOTE — Discharge Summary (Signed)
Name: Amanda Alexander MRN: [id number] DOB: Nov 01, 1960 53 y.o. PCP: Clinton Gallant, MD  Date of Admission: 04/11/2014  5:33 PM Date of Discharge: 04/13/2014 Attending Physician: Aldine Contes, MD  Discharge Diagnosis: Principal Problem:   Chest pain Active Problems:   Uncontrolled diabetes mellitus type 2 without complications   HTN (hypertension)   OSA on CPAP  Discharge Medications:   Medication List    TAKE these medications        ACCU-CHEK FASTCLIX LANCETS Misc  1 Container by Does not apply route 2 (two) times daily.     ACCU-CHEK NANO SMARTVIEW W/DEVICE Kit  1 Device by Does not apply route once.     aspirin EC 81 MG tablet  Take 81 mg by mouth daily.     glucose blood test strip  Commonly known as:  ACCU-CHEK SMARTVIEW  Diagnosis code 250.00     glyBURIDE 5 MG tablet  Commonly known as:  DIABETA  Take 1 tablet (5 mg total) by mouth 2 (two) times daily with a meal.     lisinopril-hydrochlorothiazide 20-25 MG per tablet  Commonly known as:  PRINZIDE,ZESTORETIC  Take 1 tablet by mouth daily.     metFORMIN 1000 MG tablet  Commonly known as:  GLUCOPHAGE  Take 1 tablet (1,000 mg total) by mouth 2 (two) times daily with a meal.     pantoprazole 40 MG tablet  Commonly known as:  PROTONIX  Take 1 tablet (40 mg total) by mouth daily.     traMADol 50 MG tablet  Commonly known as:  ULTRAM  Take 50 mg by mouth every 6 (six) hours as needed (pain).     traMADol 50 MG tablet  Commonly known as:  ULTRAM  Take 1 tablet (50 mg total) by mouth every 6 (six) hours as needed (pain).     traMADol 50 MG tablet  Commonly known as:  ULTRAM  Take 1 tablet (50 mg total) by mouth every 6 (six) hours as needed.     TYLENOL COLD/FLU SEVERE DAY PO  Take 1 capsule by mouth once.        Disposition and follow-up:   [name] was discharged from [facility] in Good condition.  At the hospital follow up visit please address:  1.  Diabetes  control. Discussed starting insulin on follow up. Pt needs diabetes education, and how to use insulin.   Follow-up Appointments:     Follow-up Information    Follow up with Clinton Gallant, MD.   Specialty:  Internal Medicine   Why:  We will call you with an appointment.   Contact information:   [place] [address] [phone]       Discharge Instructions: Discharge Instructions    Diet - low sodium heart healthy    Complete by:  As directed      Discharge instructions    Complete by:  As directed   I will be prescribing a medication for you called protonix, thsi will help with the reflux. Your test show that the chest pain you are having is not related to a heart attack.  When you come to the clinic we will discuss getting a more aggressive control of your diabetes.   We will call you with an appointment tomorrow.     Increase activity slowly    Complete by:  As directed            Consultations: Treatment Team:  Lynnell Dike  Blenda Bridegroom, MD  Procedures Performed:  Dg Chest 2 View  04/11/2014   CLINICAL DATA:  Chest pain for 1 week  EXAM: CHEST  2 VIEW  COMPARISON:  05/27/2008  FINDINGS: The heart size and mediastinal contours are within normal limits. Both lungs are clear. The visualized skeletal structures show degenerative change of the thoracic spine.  IMPRESSION: No active cardiopulmonary disease.   Electronically Signed   By: Inez Catalina M.D.   On: 04/11/2014 19:03   Nm Myocar Multi W/spect W/wall Motion / Ef  04/13/2014   CLINICAL DATA:  Chest pain, hypertension and diabetes.  EXAM: MYOCARDIAL IMAGING WITH SPECT (REST AND PHARMACOLOGIC-STRESS)  GATED LEFT VENTRICULAR WALL MOTION STUDY  LEFT VENTRICULAR EJECTION FRACTION  TECHNIQUE: Standard myocardial SPECT imaging was performed after resting intravenous injection of 10 mCi Tc-48msestamibi. Subsequently, intravenous infusion of Lexiscan was performed under the supervision of the Cardiology staff. At peak  effect of the drug, 30 mCi Tc-935mestamibi was injected intravenously and standard myocardial SPECT imaging was performed. Quantitative gated imaging was also performed to evaluate left ventricular wall motion, and estimate left ventricular ejection fraction.  COMPARISON:  None.  FINDINGS: Perfusion: Mild distal anteroapical attenuation on both rest and stress acquisitions is felt most likely to relate to overlying breast soft tissue attenuation.  Wall Motion: Normal left ventricular wall motion. No left ventricular dilation.  Left Ventricular Ejection Fraction: 77 %  End diastolic volume 93 ml  End systolic volume 22 ml  IMPRESSION: 1. No evidence of inducible myocardial ischemia. Mild distal anteroapical attenuation is felt to most likely relate to overlying breast soft tissue attenuation.  2. Normal left ventricular wall motion.  3. Left ventricular ejection fraction 77%  4. Low-risk stress test findings*.  *2012 Appropriate Use Criteria for Coronary Revascularization Focused Update: J Am Coll Cardiol. 204008;67(6):195-093http://[email or web address]?articleid=[email or web address]   Electronically Signed   By: GlAletta Edouard.D.   On: 04/13/2014 13:22    2D Echo: Left ventricle: The cavity size was normal. There was mild concentric hypertrophy. Systolic function was normal. The estimated ejection fraction was in the range of 55% to 60%. Wall motion was normal; there were no regional wall motion abnormalities. Left ventricular diastolic function parameters were normal. - Aortic valve: There was trivial regurgitation. - Left atrium: The atrium was mildly dilated.  Cardiac Cath: 1. No evidence of inducible myocardial ischemia. Mild distal anteroapical attenuation is felt to most likely relate to overlying breast soft tissue attenuation. 2. Normal left ventricular wall motion. 3. Left ventricular ejection fraction 77% 4. Low-risk stress test findings*.  Admission HPI: Chief  Complaint: chest pain  Scribed by Dr. RuCharlott Rakes History of Present Illness: Amanda Alexander is a [age]o AA female with poorly controlled DM2, HTN, OSA who presents today with chest pain.   She presented today to clinic today for a regular follow up appointment but complained of left sided chest pain. She reports the pain is sharp, substernal, worsened by physical activity, and relieved within 1 minute or resting. She has been having this pain frequently, especially over the past 2 weeks. Her episodes of chest pain are not associated with eating.   She has previously had some relief with NTG but recently has run out. She takes ASA 8190mnd took one this morning. Otherwise, she reports she has been taking all of her medications although admits to taking double her normal metformin dose this morning (to "make her numbers good").  Hospital Course by problem list:  Chest pain- Presented with symptoms suggestive of anginal chest pain, concerning for possible ACS. Pt was admitted and ruled out with negative troponins and EKG. Considering pts risk factors and TIMI score 3, cardiology was consulted for a stress test, pt was then scheduled for lexiscan nuclear stress test. Results of myoview- No evidence of inducible ischemia, low risk stress test. Pt was therefore discharge home.   Poorly controlled DM2 without complications- Home meds- Glyburide- 8m BID and metformin- 10033mBID. HgbA1c- 10.2- 04/11/2014. We counselled pt that with her HgbA1c, insulin would be best course to get her diabetes under control. [facility] with our diabetes co-ordinator, about options and how to take insulin,  Pt voiced understanding and agreed to plan.   HTN (hypertension): Blood pressure stable throughout admission. Home meds- Lisinopril-HCTZ 20-2575montinued on admission and on discharge.   OSA on CPAP: Continue CPAP  #FEN:  -Diet- regular diet  #DVT prophylaxis:  heparin 5000 units subcutaneous.  #CODE STATUS: FULL CODE  Discharge Vitals:   BP 125/69 mmHg  Pulse 75  Temp(Src) 99 F (37.2 C) (Oral)  Resp 18  Ht 5' (1.524 m)  Wt 224 lb 3.2 oz (101.696 kg)  BMI 43.79 kg/m2  SpO2 98%  Discharge Labs:  Results for orders placed or performed during the hospital encounter of 04/11/14 (from the past 24 hour(s))  Glucose, capillary     Status: Abnormal   Collection Time: 04/12/14  9:20 PM  Result Value Ref Range   Glucose-Capillary 208 (H) 70 - 99 mg/dL  Lipid panel     Status: None   Collection Time: 04/13/14  4:20 AM  Result Value Ref Range   Cholesterol 157 0 - 200 mg/dL   Triglycerides 140 <150 mg/dL   HDL 47 >39 mg/dL   Total CHOL/HDL Ratio 3.3 RATIO   VLDL 28 0 - 40 mg/dL   LDL Cholesterol 82 0 - 99 mg/dL  Glucose, capillary     Status: Abnormal   Collection Time: 04/13/14 11:52 AM  Result Value Ref Range   Glucose-Capillary 262 (H) 70 - 99 mg/dL  Glucose, capillary     Status: Abnormal   Collection Time: 04/13/14  4:53 PM  Result Value Ref Range   Glucose-Capillary 325 (H) 70 - 99 mg/dL    Signed: EjiBethena RoysD 04/13/2014, 5:19 PM   Services Ordered on Discharge: None Equipment Ordered on Discharge: None

## 2014-04-13 NOTE — Plan of Care (Signed)
Problem: Discharge Progression Outcomes Goal: No anginal pain Outcome: Completed/Met Date Met:  04/13/14 Goal: Hemodynamically stable Outcome: Completed/Met Date Met:  04/13/14 Goal: Complications resolved/controlled Outcome: Completed/Met Date Met:  04/13/14 Goal: Barriers To Progression Addressed/Resolved Outcome: Completed/Met Date Met:  04/13/14 Goal: Discharge plan in place and appropriate Outcome: Completed/Met Date Met:  04/13/14 Goal: Vascular site scale level 0 - I Vascular Site Scale Level 0: No bruising/bleeding/hematoma Level I (Mild): Bruising/Ecchymosis, minimal bleeding/ooozing, palpable hematoma < 3 cm Level II (Moderate): Bleeding not affecting hemodynamic parameters, pseudoaneurysm, palpable hematoma > 3 cm Level III (Severe) Bleeding which affects hemodynamic parameters or retroperitoneal hemorrhage  Outcome: Not Applicable Date Met:  04/13/14 Goal: Tolerates diet Outcome: Completed/Met Date Met:  04/13/14 Goal: Activity appropriate for discharge plan Outcome: Completed/Met Date Met:  04/13/14 Goal: Other Discharge Outcomes/Goals Outcome: Completed/Met Date Met:  04/13/14     

## 2014-04-13 NOTE — Progress Notes (Signed)
Utilization review completed.  

## 2014-04-13 NOTE — Progress Notes (Signed)
Subjective: No complaints today. Pt will like to go home today. No more chest pains hx of GERD.   Objective: Vital signs in last 24 hours: Filed Vitals:   04/13/14 0910 04/13/14 0950 04/13/14 0952 04/13/14 0954  BP: 124/67 126/52 120/54 114/48  Pulse:      Temp:      TempSrc:      Resp:      Height:      Weight:      SpO2:       Weight change: -1 lb 12.8 oz (-0.816 kg)  Intake/Output Summary (Last 24 hours) at 04/13/14 1118 Last data filed at 04/13/14 0500  Gross per 24 hour  Intake    800 ml  Output   1600 ml  Net   -800 ml   General appearance: alert, cooperative, appears stated age and no distress Back: symmetric, no curvature. ROM normal. No CVA tenderness. Lungs: clear to auscultation bilaterally Heart: regular rate and rhythm, S1, S2 normal, no murmur, click, rub or gallop Abdomen: soft, non-tender; bowel sounds normal; no masses,  no organomegaly Extremities: extremities normal, atraumatic, no cyanosis or edema Lab Results: Basic Metabolic Panel:  Recent Labs Lab 04/11/14 2000  NA 140  K 3.8  CL 101  CO2 26  GLUCOSE 216*  BUN 14  CREATININE 0.69  CALCIUM 9.2   Liver Function Tests:  Recent Labs Lab 04/11/14 2000  AST 12  ALT 17  ALKPHOS 89  BILITOT 0.3  PROT 7.1  ALBUMIN 3.4*   No results for input(s): LIPASE, AMYLASE in the last 168 hours. No results for input(s): AMMONIA in the last 168 hours. CBC:  Recent Labs Lab 04/11/14 2000  WBC 6.3  NEUTROABS 3.3  HGB 12.1  HCT 35.8*  MCV 86.9  PLT 293   Cardiac Enzymes:  Recent Labs Lab 04/11/14 2000 04/12/14 0130 04/12/14 1015  TROPONINI <0.30 <0.30 <0.30   CBG:  Recent Labs Lab 04/11/14 1837 04/11/14 2037 04/12/14 0747 04/12/14 1153 04/12/14 1628 04/12/14 2120  GLUCAP 143* 260* 243* 187* 223* 208*   Hemoglobin A1C:  Recent Labs Lab 04/11/14 2000  HGBA1C 10.2*   Fasting Lipid Panel:  Recent Labs Lab 04/13/14 0420  CHOL 157  HDL 47  LDLCALC 82  TRIG 140    CHOLHDL 3.3   Micro Results: No results found for this or any previous visit (from the past 240 hour(s)). Studies/Results: Dg Chest 2 View  04/11/2014   CLINICAL DATA:  Chest pain for 1 week  EXAM: CHEST  2 VIEW  COMPARISON:  05/27/2008  FINDINGS: The heart size and mediastinal contours are within normal limits. Both lungs are clear. The visualized skeletal structures show degenerative change of the thoracic spine.  IMPRESSION: No active cardiopulmonary disease.   Electronically Signed   By: Alcide CleverMark  Lukens M.D.   On: 04/11/2014 19:03   Medications: I have reviewed the patient's current medications. Scheduled Meds: . heparin  5,000 Units Subcutaneous 3 times per day  . lisinopril  20 mg Oral Daily   And  . hydrochlorothiazide  25 mg Oral Daily  . insulin aspart  0-15 Units Subcutaneous TID WC  . insulin aspart  0-5 Units Subcutaneous QHS  . metoprolol tartrate  25 mg Oral BID   Continuous Infusions:  PRN Meds:.acetaminophen, gi cocktail, ondansetron (ZOFRAN) IV, traMADol Assessment/Plan:  Amanda Alexander is a 53 yo AA female with poorly controlled DM2, HTN, OSA who presents today with chest pain.   Chest pain: Initially concerned  for ACS. But Myoview - low risk. Most likely due to GERD, as pt endorses a hx of reflux,  - Talked to acrds about result, okay for discharge home. - D/c metop on discharge - Discharge home.  Poorly controlled DM2 without complications- Hgba1c- 10.2, On metformin-100mg  BID and glyburide 5mg  daily. Pt needs insulin therapy for aggressive control of her Dm. Blood suagrs-  - Hold home medications - Will talk to pt about starting lantus, pt says she will like to follow up at clinic and discuss this. -Follow up during the week with us in clinic.  HTN (hypertension): Continue home BP meds  OSA on CPAP: Continue CPAP  #CODE STATUS: FULL CODE   Dispo: Home today.  The patient does have a current PCP [name], MD) and [facility] discharge.  The patient does not have transportation limitations that hinder transportation to clinic appointments.  .Services Needed at time of discharge: Y = Yes, Blank = No PT:   OT:   RN:   Equipment:   Other:     LOS: 2 days   Onnie BoerEjiroghene E Sullivan Blasing, MD 04/13/2014, 11:18 AM

## 2014-04-13 NOTE — Plan of Care (Signed)
Problem: Phase III Progression Outcomes Goal: Hemodynamically stable Outcome: Completed/Met Date Met:  04/13/14 Goal: No anginal pain Outcome: Completed/Met Date Met:  04/13/14 Goal: Cath/PCI Path as indicated Outcome: Not Applicable Date Met:  04/13/14 Goal: Vascular site scale level 0 - I Vascular Site Scale Level 0: No bruising/bleeding/hematoma Level I (Mild): Bruising/Ecchymosis, minimal bleeding/ooozing, palpable hematoma < 3 cm Level II (Moderate): Bleeding not affecting hemodynamic parameters, pseudoaneurysm, palpable hematoma > 3 cm Level III (Severe) Bleeding which affects hemodynamic parameters or retroperitoneal hemorrhage  Outcome: Not Applicable Date Met:  04/13/14 Goal: Discharge plan remains appropriate-arrangements made Outcome: Completed/Met Date Met:  04/13/14 Goal: Tolerating diet Outcome: Completed/Met Date Met:  04/13/14 Goal: If positive for MI, change to MI Path Outcome: Not Applicable Date Met:  04/13/14 Goal: Other Phase III Outcomes/Goals Outcome: Completed/Met Date Met:  04/13/14     

## 2014-04-13 NOTE — Progress Notes (Signed)
Patient Name: Amanda GamblesCandace F Sako Date of Encounter: 04/13/2014     Principal Problem:   Chest pain Active Problems:   Uncontrolled diabetes mellitus type 2 without complications   HTN (hypertension)   OSA on CPAP    SUBJECTIVE Seen in nuclear medicine for her lexiscan nuclear stress test. Tolerated the procedure well. No further CP.  CURRENT MEDS . heparin  5,000 Units Subcutaneous 3 times per day  . lisinopril  20 mg Oral Daily   And  . hydrochlorothiazide  25 mg Oral Daily  . insulin aspart  0-15 Units Subcutaneous TID WC  . insulin aspart  0-5 Units Subcutaneous QHS  . metoprolol tartrate  25 mg Oral BID    OBJECTIVE  Filed Vitals:   04/12/14 2200 04/13/14 0005 04/13/14 0400 04/13/14 0910  BP: 114/67 101/56 90/54 124/67  Pulse: 64 68 52   Temp:  98.3 F (36.8 C) 99 F (37.2 C)   TempSrc:      Resp:  20 18   Height:      Weight:   224 lb 3.2 oz (101.696 kg)   SpO2: 97% 97% 99%     Intake/Output Summary (Last 24 hours) at 04/13/14 0947 Last data filed at 04/13/14 0500  Gross per 24 hour  Intake    800 ml  Output   1600 ml  Net   -800 ml   Filed Weights   04/11/14 2026 04/12/14 0400 04/13/14 0400  Weight: 226 lb (102.513 kg) 225 lb 12.8 oz (102.422 kg) 224 lb 3.2 oz (101.696 kg)    PHYSICAL EXAM  General: Pleasant, NAD. Neuro: Alert and oriented X 3. Moves all extremities spontaneously. Psych: Normal affect. HEENT:  Normal  Neck: Supple without bruits or JVD. Lungs:  Resp regular and unlabored, CTA. Heart: RRR no s3, s4, or murmurs. Abdomen: Soft, non-tender, non-distended, BS + x 4.  Extremities: No clubbing, cyanosis or edema. DP/PT/Radials 2+ and equal bilaterally.  Accessory Clinical Findings  CBC  Recent Labs  04/11/14 2000  WBC 6.3  NEUTROABS 3.3  HGB 12.1  HCT 35.8*  MCV 86.9  PLT 293   Basic Metabolic Panel  Recent Labs  04/11/14 2000  NA 140  K 3.8  CL 101  CO2 26  GLUCOSE 216*  BUN 14  CREATININE 0.69  CALCIUM 9.2     Liver Function Tests  Recent Labs  04/11/14 2000  AST 12  ALT 17  ALKPHOS 89  BILITOT 0.3  PROT 7.1  ALBUMIN 3.4*    Cardiac Enzymes  Recent Labs  04/11/14 2000 04/12/14 0130 04/12/14 1015  TROPONINI <0.30 <0.30 <0.30    Hemoglobin A1C  Recent Labs  04/11/14 2000  HGBA1C 10.2*   Fasting Lipid Panel  Recent Labs  04/13/14 0420  CHOL 157  HDL 47  LDLCALC 82  TRIG 140  CHOLHDL 3.3     TELE NSR  Radiology/Studies  Dg Chest 2 View  04/11/2014   CLINICAL DATA:  Chest pain for 1 week  EXAM: CHEST  2 VIEW  COMPARISON:  05/27/2008  FINDINGS: The heart size and mediastinal contours are within normal limits. Both lungs are clear. The visualized skeletal structures show degenerative change of the thoracic spine.  IMPRESSION: No active cardiopulmonary disease.   Electronically Signed   By: Alcide CleverMark  Lukens M.D.   On: 04/11/2014 19:03    ASSESSMENT AND PLAN Amanda Alexander is a 53 y.o. female with a history of DM, HTN, OSA who p/w CP for 2  weeks.  Chest pain -- Troponin neg x2 -- Await results of nuclear stress test  DM- uncontrolled. Hh A1c 10  HTN (hypertension): Continue lisinopril/HCTZ combo, lopressor 25mg  bID  Signed, Janetta HoraHOMPSON, KATHRYN R PA-C  Pager [phone]  I have seen and examined the patient along with Cline CrockHOMPSON, KATHRYN R PA-C.  I have reviewed the chart, notes and new data.  I agree with PA's note.  PLAN: Atypical chest pain with low risk biochemical markers and ECG. Will review stress test results - if normal, no additional workup planned by Cardiology  Thurmon FairMihai Kriya Westra, MD, [facility] [phone] 04/13/2014, 10:24 AM

## 2014-04-14 ENCOUNTER — Encounter: Payer: Self-pay | Admitting: Dietician

## 2014-04-14 ENCOUNTER — Encounter: Payer: Self-pay | Admitting: Internal Medicine

## 2014-04-14 ENCOUNTER — Ambulatory Visit (INDEPENDENT_AMBULATORY_CARE_PROVIDER_SITE_OTHER): Payer: 59 | Admitting: Internal Medicine

## 2014-04-14 ENCOUNTER — Telehealth: Payer: Self-pay | Admitting: Dietician

## 2014-04-14 ENCOUNTER — Ambulatory Visit (INDEPENDENT_AMBULATORY_CARE_PROVIDER_SITE_OTHER): Payer: 59 | Admitting: Dietician

## 2014-04-14 VITALS — BP 122/69 | HR 70 | Temp 98.0°F | Ht 60.0 in | Wt 226.0 lb

## 2014-04-14 DIAGNOSIS — IMO0001 Reserved for inherently not codable concepts without codable children: Secondary | ICD-10-CM

## 2014-04-14 DIAGNOSIS — E1165 Type 2 diabetes mellitus with hyperglycemia: Secondary | ICD-10-CM

## 2014-04-14 DIAGNOSIS — Z0000 Encounter for general adult medical examination without abnormal findings: Secondary | ICD-10-CM

## 2014-04-14 DIAGNOSIS — R079 Chest pain, unspecified: Secondary | ICD-10-CM

## 2014-04-14 DIAGNOSIS — I10 Essential (primary) hypertension: Secondary | ICD-10-CM

## 2014-04-14 MED ORDER — GLYBURIDE 5 MG PO TABS: ORAL_TABLET | ORAL | Status: DC

## 2014-04-14 NOTE — Patient Instructions (Signed)
General Instructions:  Please bring your medicines with you each time you come to clinic.  Medicines may include prescription medications, over-the-counter medications, herbal remedies, eye drops, vitamins, or other pills.  Please start with 5mg  in AM of glyburide and 7.5mg  in PM for the first week, call back with your blood sugar values and we may need to adjust the dose. Continue with metformin 1000mg  twice a day and work on the dietary changes we recommended today  Progress Toward Treatment Goals:  Treatment Goal 04/14/2014  Hemoglobin A1C deteriorated  Blood pressure at goal    Self Care Goals & Plans:  Self Care Goal 04/11/2014  Manage my medications take my medicines as prescribed; bring my medications to every visit; refill my medications on time  Monitor my health keep track of my blood glucose; bring my glucose meter and log to each visit  Eat healthy foods drink diet soda or water instead of juice or soda; eat more vegetables; eat foods that are low in salt; eat baked foods instead of fried foods; eat fruit for snacks and desserts    Home Blood Glucose Monitoring 04/14/2014  Check my blood sugar 3 times a day  When to check my blood sugar before meals    Care Management & Community Referrals:  Referral 04/14/2014  Referrals made for care management support diabetes educator    Diabetes Mellitus and Food It is important for you to manage your blood sugar (glucose) level. Your blood glucose level can be greatly affected by what you eat. Eating healthier foods in the appropriate amounts throughout the day at about the same time each day will help you control your blood glucose level. It can also help slow or prevent worsening of your diabetes mellitus. Healthy eating may even help you improve the level of your blood pressure and reach or maintain a healthy weight.  HOW CAN FOOD AFFECT ME? Carbohydrates Carbohydrates affect your blood glucose level more than any other type of  food. Your dietitian will help you determine how many carbohydrates to eat at each meal and teach you how to count carbohydrates. Counting carbohydrates is important to keep your blood glucose at a healthy level, especially if you are using insulin or taking certain medicines for diabetes mellitus. Alcohol Alcohol can cause sudden decreases in blood glucose (hypoglycemia), especially if you use insulin or take certain medicines for diabetes mellitus. Hypoglycemia can be a life-threatening condition. Symptoms of hypoglycemia (sleepiness, dizziness, and disorientation) are similar to symptoms of having too much alcohol.  If your health care provider has given you approval to drink alcohol, do so in moderation and use the following guidelines:  Women should not have more than one drink per day, and men should not have more than two drinks per day. One drink is equal to:  12 oz of beer.  5 oz of wine.  1 oz of hard liquor.  Do not drink on an empty stomach.  Keep yourself hydrated. Have water, diet soda, or unsweetened iced tea.  Regular soda, juice, and other mixers might contain a lot of carbohydrates and should be counted. WHAT FOODS ARE NOT RECOMMENDED? As you make food choices, it is important to remember that all foods are not the same. Some foods have fewer nutrients per serving than other foods, even though they might have the same number of calories or carbohydrates. It is difficult to get your body what it needs when you eat foods with fewer nutrients. Examples of foods that you  should avoid that are high in calories and carbohydrates but low in nutrients include:  Trans fats (most processed foods list trans fats on the Nutrition Facts label).  Regular soda.  Juice.  Candy.  Sweets, such as cake, pie, doughnuts, and cookies.  Fried foods. WHAT FOODS CAN I EAT? Have nutrient-rich foods, which will nourish your body and keep you healthy. The food you should eat also will depend  on several factors, including:  The calories you need.  The medicines you take.  Your weight.  Your blood glucose level.  Your blood pressure level.  Your cholesterol level. You also should eat a variety of foods, including:  Protein, such as meat, poultry, fish, tofu, nuts, and seeds (lean animal proteins are best).  Fruits.  Vegetables.  Dairy products, such as milk, cheese, and yogurt (low fat is best).  Breads, grains, pasta, cereal, rice, and beans.  Fats such as olive oil, trans fat-free margarine, canola oil, avocado, and olives. DOES EVERYONE WITH DIABETES MELLITUS HAVE THE SAME MEAL PLAN? Because every person with diabetes mellitus is different, there is not one meal plan that works for everyone. It is very important that you meet with a dietitian who will help you create a meal plan that is just right for you. Document Released: 02/24/2005 Document Revised: 06/04/2013 Document Reviewed: 04/26/2013 [facility] Patient Information 2015 [place], [place]. This information is not intended to replace advice given to you by your health care provider. Make sure you discuss any questions you have with your health care provider.

## 2014-04-14 NOTE — Assessment & Plan Note (Signed)
Foot exam today. 

## 2014-04-14 NOTE — Telephone Encounter (Signed)
Patient just discharged home from our hospital: she is not happy with her blood sugars, wants to know why she was not sent home on insulin. CBGs today 232 before, 320 after breakfast. Read discharge instructions, does not appear she was supposed to go home on insulin per her choice, but is supposed to discuss starting insulin at hospital follow up. A1C 10.2%.  Told her that as long as her blood sugars are not staying above 300 that she is not in danger and it is not an emergency to start insulin, to drink lot's of water and eat lean protein and vegetables, limit carbs and processed meat, keep checking blood sugars more often. She wants to come in Today or next Monday after 330. Transferred her call to the front office for an appointment.

## 2014-04-14 NOTE — Progress Notes (Signed)
  Medical Nutrition Therapy:  Appt start time: 1600 end time:  1700.  Assessment:  Primary concerns today: blood sugar control.  Patient is here with her daughter Sunny SchleinFelicia for a meal plan to help her better control her blood sugars so she won;t need insulin. No specific meal followed previously, but has had dietary education. Tries to eat balanced meal and include vegetables.  Preferred Learning Style:  Auditory, visual, Hands on Learning Readiness: Ready MEDICATIONS: note glyburide increased today  Blood Sugars: average 228 x past 30 days, high fasting and overnight. More variabliity in her eveing values, mostly high mid sleep and fasting.  DIETARY INTAKE: Usual eating pattern includes 3 meals and 1-2 snacks per day. Everyday foods include vegetables, eggs, .  Avoided foods include none mentioned today.   24-hr recall:  B (6-9 AM): red hot wienie or liver pudding, . Egg boiled x1, 2 slices toast,  L ( 1-2 PM): misses often at work or salad with lettuce, cukes, bacon bits, cheese, eggs, cranberries, sunflower seeds and blue cheese dressing, 6 crackers D ( 5-6 PM): fried fish, potatoes, greens, water Snk ( PM): milk and cereal, suspect night eating or poor sleep causing high blood sugars over night.  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   Nutritional Diagnosis:  NB-1.1 Food and nutrition-related knowledge deficit As related to lack of sufficient previous self management training.  As evidenced by her questions and lack of knowlege about what foods affect blood sugars most. .    Intervention:  Nutrition education about carb counting, diabetes meal planning and target blood sugars for testing around meals. Teaching Method Utilized: Visual, Auditory, Hands  on Handouts given during visit include: AVS, Carb counting and meal planning, plate method Barriers to learning/adherence to lifestyle change: competing values, support Demonstrated degree of understanding via:  Teach Back   Monitoring/Evaluation:  Dietary intake, exercise, meter, and body weight prn. (suggested follow up - patient wanted to call to schedule)

## 2014-04-14 NOTE — Progress Notes (Signed)
Subjective:   Patient ID: Amanda Alexander female   DOB: 1961-03-02 53 y.o.   MRN: [id number]  HPI: [name] is a 53 y.o. female with HTN and DM2 presenting to opc today for hospital follow up visit. Recently discharged from the hospital yesterday for chest pain and ruled out for ACS but continues to have uncontrolled DM2.   DM2--uncontrolled, A1C 10.2 04/11/14. On glyburide 32m bid and metformin 10011mbid. Spoke with CDE today and noted not happy with blood sugars and wants to be on insulin. CBG today 232 per phone note and 320 after breakfast. Today, presents with daughter. Admits to not following a regular diet and high carb intake with snacking. Has never been on insulin. Daughter says she sometimes does not eat much while at work or to bring sugars down then starts eating and is not balanced. We had a long discussion today about options but focusing on dietary modifications and understanding what insulin will require. She is very busy at work and doesn't always have time to eat properly and thus being on insulin will require more close monitoring of blood sugars. She does admit to frequency. Plans to meet with CDE after visit today.   CP--now resolved since hospital admission.   Past Medical History  Diagnosis Date  . Hypertension   . Diabetes mellitus without complication   . OSA on CPAP    Current Outpatient Prescriptions  Medication Sig Dispense Refill  . ACCU-CHEK FASTCLIX LANCETS MISC 1 Container by Does not apply route 2 (two) times daily. 102 each 0  . aspirin EC 81 MG tablet Take 81 mg by mouth daily.    . Blood Glucose Monitoring Suppl (ACCU-CHEK NANO SMARTVIEW) W/DEVICE KIT 1 Device by Does not apply route once. 1 kit 0  . glucose blood (ACCU-CHEK SMARTVIEW) test strip Diagnosis code 250.00 100 each 12  . glyBURIDE (DIABETA) 5 MG tablet Take 1 tablet (5 mg total) by mouth 2 (two) times daily with a meal. 180 tablet 1  . lisinopril-hydrochlorothiazide  (PRINZIDE,ZESTORETIC) 20-25 MG per tablet Take 1 tablet by mouth daily. 90 tablet 6  . metFORMIN (GLUCOPHAGE) 1000 MG tablet Take 1 tablet (1,000 mg total) by mouth 2 (two) times daily with a meal. 180 tablet 3  . pantoprazole (PROTONIX) 40 MG tablet Take 1 tablet (40 mg total) by mouth daily. 30 tablet 1  . Pseudoephedrine-APAP-DM (TYLENOL COLD/FLU SEVERE DAY PO) Take 1 capsule by mouth once.    . traMADol (ULTRAM) 50 MG tablet Take 50 mg by mouth every 6 (six) hours as needed (pain).    . traMADol (ULTRAM) 50 MG tablet Take 1 tablet (50 mg total) by mouth every 6 (six) hours as needed (pain). 30 tablet 0  . traMADol (ULTRAM) 50 MG tablet Take 1 tablet (50 mg total) by mouth every 6 (six) hours as needed. 30 tablet 0   No current facility-administered medications for this visit.   Family History  Problem Relation Age of Onset  . Cancer Father    History   Social History  . Marital Status: Widowed    Spouse Name: N/A    Number of Children: N/A  . Years of Education: N/A   Social History Main Topics  . Smoking status: Never Smoker   . Smokeless tobacco: Not on file  . Alcohol Use: No  . Drug Use: No  . Sexual Activity: No   Other Topics Concern  . Not on file   Social History Narrative  Review of Systems:  Constitutional:  Hungry  HEENT:  Denies congestion  Respiratory:  Denies SOB.   Cardiovascular:  Denies chest pain  Gastrointestinal:  Denies nausea, vomiting, abdominal pain  Genitourinary:  Denies dysuria. Frequency   Musculoskeletal:  Occasional foot pain  Skin:  Denies pallor, rash and wound.   Neurological:  Denies syncope   Objective:  Physical Exam: Filed Vitals:   04/14/14 1435  BP: 122/69  Pulse: 70  Temp: 98 F (36.7 C)  TempSrc: Oral  Height: 5' (1.524 m)  Weight: 226 lb (102.513 kg)  SpO2: 99%   Vitals reviewed. General: sitting in chair, NAD, eating crackers HEENT: EOMI Cardiac: RRR Pulm: clear to auscultation bilaterally, no wheezes,  rales, or rhonchi Abd: soft, obese, BS present Ext: warm and well perfused, +2DP B/L no ulcers Neuro: alert and oriented X3, strength and sensation to light touch equal in bilateral upper and lower extremities  Assessment & Plan:  Discussed with Dr. Daryll Drown Increased glipizide for now CDE visit to follow

## 2014-04-14 NOTE — Assessment & Plan Note (Signed)
Lab Results  Component Value Date   HGBA1C 10.2* 04/11/2014   HGBA1C 9.8 12/06/2013   HGBA1C 7.5 03/08/2013    Assessment: Diabetes control: poor control (HgbA1C >9%) Progress toward A1C goal:  deteriorated Comments: up to 10.2  Plan: Medications:  increase glipizide to 5mg  am and 7.5mg  in pm first week and slowly titrate up. ultimately may end up starting lantus if cbg's remain uncontrolle Home glucose monitoring: Frequency: 3 times a day Timing: before meals Instruction/counseling given: reminded to bring blood glucose meter & log to each visit, reminded to bring medications to each visit, discussed foot care, discussed the need for weight loss and discussed diet Educational resources provided:   Self management tools provided:   Other plans: recheck 1 month, keep in touch with CDE over next few weeks, slowly titrate up glipizide in meantime

## 2014-04-14 NOTE — Assessment & Plan Note (Signed)
Low risk stress. No more CP since discharge.

## 2014-04-14 NOTE — Progress Notes (Signed)
Medicine attending: I personally interviewed and examined this patient together with resident physician Dr. Christen BameNora Sadek and I concur with her evaluation and management plan. This is a pleasant, obese, lady with multiple coronary risk factors who presents with unstable angina. There are no acute changes on her electrocardiogram but her history is significant and we feel that hospital admission to expedite her evaluation is warranted. We will make the appropriate arrangements. Cephas DarbyJames Camaya Gannett, M.D., FACP

## 2014-04-14 NOTE — Patient Instructions (Addendum)
Here is what I think I heard you said you planned to do over the next few weeks to help lower your blood sugars:  1-  try to limit how much bread you eat 2-  try to eat something midday if you don't get lunch  3-  try to eat about 3 servings  of food that counts as a carb for each meal  4-  try to eat about 1 serving  of food that counts as a carb for snacks  Please try to eat about 3 carb choices at each meal and 1 for snacks     Examples:  2 starch + 1 fruit = 3 carb choices      1 starch + 1 milk + 1 fruit = 3 carb choices      2 starch + 1 milk = 3 carb choices      3 starches = 3 carb choices  Veggies and meats do not count- add them as needed.   It would probably help you to go over meal planning at least one more time.  Name the 5 food groups that you have to count when counting carbs?   Name the 5 starchy vegetables:    Please call me anytime with questions or concerns about your diabetes,    Lupita LeashDonna [phone]

## 2014-04-14 NOTE — Assessment & Plan Note (Signed)
BP Readings from Last 3 Encounters:  04/14/14 122/69  04/13/14 125/69  04/11/14 114/66    Lab Results  Component Value Date   NA 140 04/11/2014   K 3.8 04/11/2014   CREATININE 0.69 04/11/2014   Assessment: Blood pressure control: controlled Progress toward BP goal:  at goal  Plan: Medications:  continue current medications prinzide 20-25mg 

## 2014-04-15 ENCOUNTER — Ambulatory Visit: Payer: 59 | Admitting: Internal Medicine

## 2014-04-15 LAB — GLUCOSE, CAPILLARY: Glucose-Capillary: 170 mg/dL — ABNORMAL HIGH (ref 70–99)

## 2014-04-17 NOTE — Progress Notes (Signed)
Internal Medicine Clinic Attending  Case discussed with Dr. Qureshi soon after the resident saw the patient.  We reviewed the resident's history and exam and pertinent patient test results.  I agree with the assessment, diagnosis, and plan of care documented in the resident's note. 

## 2014-05-23 ENCOUNTER — Ambulatory Visit (INDEPENDENT_AMBULATORY_CARE_PROVIDER_SITE_OTHER): Payer: 59 | Admitting: Internal Medicine

## 2014-05-23 ENCOUNTER — Other Ambulatory Visit: Payer: Self-pay | Admitting: Dietician

## 2014-05-23 ENCOUNTER — Encounter: Payer: Self-pay | Admitting: Internal Medicine

## 2014-05-23 VITALS — BP 105/58 | HR 62 | Temp 98.1°F | Ht 60.0 in | Wt 226.8 lb

## 2014-05-23 DIAGNOSIS — I10 Essential (primary) hypertension: Secondary | ICD-10-CM

## 2014-05-23 DIAGNOSIS — E1165 Type 2 diabetes mellitus with hyperglycemia: Principal | ICD-10-CM

## 2014-05-23 DIAGNOSIS — IMO0001 Reserved for inherently not codable concepts without codable children: Secondary | ICD-10-CM

## 2014-05-23 DIAGNOSIS — G4733 Obstructive sleep apnea (adult) (pediatric): Secondary | ICD-10-CM

## 2014-05-23 DIAGNOSIS — E119 Type 2 diabetes mellitus without complications: Secondary | ICD-10-CM | POA: Insufficient documentation

## 2014-05-23 DIAGNOSIS — Z9989 Dependence on other enabling machines and devices: Secondary | ICD-10-CM

## 2014-05-23 DIAGNOSIS — K219 Gastro-esophageal reflux disease without esophagitis: Secondary | ICD-10-CM | POA: Insufficient documentation

## 2014-05-23 HISTORY — DX: Gastro-esophageal reflux disease without esophagitis: K21.9

## 2014-05-23 LAB — GLUCOSE, CAPILLARY: Glucose-Capillary: 184 mg/dL — ABNORMAL HIGH (ref 70–99)

## 2014-05-23 LAB — HM DIABETES EYE EXAM

## 2014-05-23 MED ORDER — GLYBURIDE 5 MG PO TABS: ORAL_TABLET | ORAL | Status: DC

## 2014-05-23 MED ORDER — PANTOPRAZOLE SODIUM 40 MG PO TBEC: 40.0000 mg | DELAYED_RELEASE_TABLET | Freq: Every day | ORAL | Status: DC

## 2014-05-23 MED ORDER — GLUCOSE BLOOD VI STRP: ORAL_STRIP | Status: DC

## 2014-05-23 NOTE — Assessment & Plan Note (Signed)
Lab Results  Component Value Date   HGBA1C 10.2* 04/11/2014   HGBA1C 9.8 12/06/2013   HGBA1C 7.5 03/08/2013     Assessment: Diabetes control:  Uncontrolled Progress toward A1C goal:   Improved Comments: patient presents for follow up for T2DM. At last visit one month ago, patient had glyburide increased to 5 mg QAM and 10 mg QPM. Her glucose has improved slightly to 130-220s. Unfortunately, patient ran out of strips (?) and could not check her glucose for the past 2 weeks. She denies any hypoglycemic episodes or symptoms.  Plan: Medications:  Increased glyburide to 10 mg QAM and 10 mg QPM. Continue metformin 1000 mg BID. Home glucose monitoring: Frequency:   twice a day Timing:  before breakfast, at night Instruction/counseling given: reminded to get eye exam, reminded to bring blood glucose meter & log to each visit, reminded to bring medications to each visit and discussed diet Educational resources provided:   Self management tools provided: copy of home glucose meter download Other plans: Counseled on signs of hypoglycemia. Return in 1.5 months for recheck and hemoglobin A1c

## 2014-05-23 NOTE — Assessment & Plan Note (Signed)
Using CPAP 

## 2014-05-23 NOTE — Progress Notes (Signed)
   Subjective:    Patient ID: Amanda Alexander, female    DOB: 1960-09-03, 53 y.o.   MRN: [id number]  HPI Amanda Alexander is a 53 yo female with PMHx of HTN, OSA, T2DM who presents for follow up. Please see problem oriented assessment and plan for more information.  Review of Systems General: Denies fever, chills, fatigue Respiratory: Denies SOB, cough Cardiovascular: Denies chest pain and palpitations.  Gastrointestinal: Denies nausea, vomiting, abdominal pain. Endocrine: Denies hot or cold intolerance, polyuria, and polydipsia. Neurological: Denies dizziness, headaches, weakness, lightheadedness Psychiatric/Behavioral: Denies mood changes, confusion  Past Medical History  Diagnosis Date  . Hypertension   . Diabetes mellitus without complication   . OSA on CPAP    Current Outpatient Prescriptions on File Prior to Visit  Medication Sig Dispense Refill  . ACCU-CHEK FASTCLIX LANCETS MISC 1 Container by Does not apply route 2 (two) times daily. 102 each 0  . aspirin EC 81 MG tablet Take 81 mg by mouth daily.    . Blood Glucose Monitoring Suppl (ACCU-CHEK NANO SMARTVIEW) W/DEVICE KIT 1 Device by Does not apply route once. 1 kit 0  . glucose blood (ACCU-CHEK SMARTVIEW) test strip Diagnosis code 250.00 100 each 12  . glyBURIDE (DIABETA) 5 MG tablet Take 17m in AM and 7.520min PM 180 tablet 1  . lisinopril-hydrochlorothiazide (PRINZIDE,ZESTORETIC) 20-25 MG per tablet Take 1 tablet by mouth daily. 90 tablet 6  . metFORMIN (GLUCOPHAGE) 1000 MG tablet Take 1 tablet (1,000 mg total) by mouth 2 (two) times daily with a meal. 180 tablet 3  . pantoprazole (PROTONIX) 40 MG tablet Take 1 tablet (40 mg total) by mouth daily. 30 tablet 1  . traMADol (ULTRAM) 50 MG tablet Take 1 tablet (50 mg total) by mouth every 6 (six) hours as needed. 30 tablet 0   No current facility-administered medications on file prior to visit.      Objective:   Physical Exam Filed Vitals:   05/23/14 1024  BP: 105/58  Pulse:  62  Temp: 98.1 F (36.7 C)  TempSrc: Oral  Height: 5' (1.524 m)  Weight: 226 lb 12.8 oz (102.876 kg)  SpO2: 100%   General: Vital signs reviewed.  Patient is well-developed and well-nourished, in no acute distress and cooperative with exam.  Cardiovascular: RRR, S1 normal, S2 normal, no murmurs, gallops, or rubs. Pulmonary/Chest: Clear to auscultation bilaterally, no wheezes, rales, or rhonchi. Abdominal: Soft, non-tender, non-distended, BS + Extremities: No lower extremity edema bilaterally,  pulses symmetric and intact bilaterally Skin: Warm, dry and intact. No rashes or erythema. Psychiatric: Normal mood and affect.     Assessment & Plan:   Please see problem based assessment and plan.

## 2014-05-23 NOTE — Patient Instructions (Signed)
General Instructions:    Thank you for bringing your medicines today. This helps us keep you safe from mistakes.  TAKE GLYBURIDE 10 MG (2 PILLS) IN THE MORNING AND 10 MG (2 PILLS) AT NIGHT  PLEASE FOLLOW UP IN 1.5 MONTHS FOR RECHECK  WATCH FOR SIGNS OF HYPOGLYCEMIA (LOW BLOOD SUGAR) AS LISTED BELOW  Hypoglycemia Hypoglycemia occurs when the glucose in your blood is too low. Glucose is a type of sugar that is your body's main energy source. Hormones, such as insulin and glucagon, control the level of glucose in the blood. Insulin lowers blood glucose and glucagon increases blood glucose. Having too much insulin in your blood stream, or not eating enough food containing sugar, can result in hypoglycemia. Hypoglycemia can happen to people with or without diabetes. It can develop quickly and can be a medical emergency.  CAUSES   Missing or delaying meals.  Not eating enough carbohydrates at meals.  Taking too much diabetes medicine.  Not timing your oral diabetes medicine or insulin doses with meals, snacks, and exercise.  Nausea and vomiting.  Certain medicines.  Severe illnesses, such as hepatitis, kidney disorders, and certain eating disorders.  Increased activity or exercise without eating something extra or adjusting medicines.  Drinking too much alcohol.  A nerve disorder that affects body functions like your heart rate, blood pressure, and digestion (autonomic neuropathy).  A condition where the stomach muscles do not function properly (gastroparesis). Therefore, medicines and food may not absorb properly.  Rarely, a tumor of the pancreas can produce too much insulin. SYMPTOMS   Hunger.  Sweating (diaphoresis).  Change in body temperature.  Shakiness.  Headache.  Anxiety.  Lightheadedness.  Irritability.  Difficulty concentrating.  Dry mouth.  Tingling or numbness in the hands or feet.  Restless sleep or sleep disturbances.  Altered speech and  coordination.  Change in mental status.  Seizures or prolonged convulsions.  Combativeness.  Drowsiness (lethargic).  Weakness.  Increased heart rate or palpitations.  Confusion.  Pale, gray skin color.  Blurred or double vision.  Fainting. DIAGNOSIS  A physical exam and medical history will be performed. Your caregiver may make a diagnosis based on your symptoms. Blood tests and other lab tests may be performed to confirm a diagnosis. Once the diagnosis is made, your caregiver will see if your signs and symptoms go away once your blood glucose is raised.  TREATMENT  Usually, you can easily treat your hypoglycemia when you notice symptoms.  Check your blood glucose. If it is less than 70 mg/dl, take one of the following:   3-4 glucose tablets.    cup juice.    cup regular soda.   1 cup skim milk.   -1 tube of glucose gel.   5-6 hard candies.   Avoid high-fat drinks or food that may delay a rise in blood glucose levels.  Do not take more than the recommended amount of sugary foods, drinks, gel, or tablets. Doing so will cause your blood glucose to go too high.   Wait 10-15 minutes and recheck your blood glucose. If it is still less than 70 mg/dl or below your target range, repeat treatment.   Eat a snack if it is more than 1 hour until your next meal.  There may be a time when your blood glucose may go so low that you are unable to treat yourself at home when you start to notice symptoms. You may need someone to help you. You may even faint or be  unable to swallow. If you cannot treat yourself, someone will need to bring you to the hospital.  HOME CARE INSTRUCTIONS  If you have diabetes, follow your diabetes management plan by:  Taking your medicines as directed.  Following your exercise plan.  Following your meal plan. Do not skip meals. Eat on time.  Testing your blood glucose regularly. Check your blood glucose before and after exercise. If you  exercise longer or different than usual, be sure to check blood glucose more frequently.  Wearing your medical alert jewelry that says you have diabetes.  Identify the cause of your hypoglycemia. Then, develop ways to prevent the recurrence of hypoglycemia.  Do not take a hot bath or shower right after an insulin shot.  Always carry treatment with you. Glucose tablets are the easiest to carry.  If you are going to drink alcohol, drink it only with meals.  Tell friends or family members ways to keep you safe during a seizure. This may include removing hard or sharp objects from the area or turning you on your side.  Maintain a healthy weight. SEEK MEDICAL CARE IF:   You are having problems keeping your blood glucose in your target range.  You are having frequent episodes of hypoglycemia.  You feel you might be having side effects from your medicines.  You are not sure why your blood glucose is dropping so low.  You notice a change in vision or a new problem with your vision. SEEK IMMEDIATE MEDICAL CARE IF:   Confusion develops.  A change in mental status occurs.  The inability to swallow develops.  Fainting occurs. Document Released: 05/30/2005 Document Revised: 06/04/2013 Document Reviewed: 09/26/2011 [facility] Patient Information 2015 [place], [place]. This information is not intended to replace advice given to you by your health care provider. Make sure you discuss any questions you have with your health care provider.

## 2014-05-23 NOTE — Assessment & Plan Note (Signed)
Assessment: Patient admits to reflux. Questioning what pantoprazole 40 mg daily is for. She did not pick this up.  Plan: -Take pantoprazole 40 mg daily

## 2014-05-23 NOTE — Assessment & Plan Note (Signed)
BP Readings from Last 3 Encounters:  05/23/14 105/58  04/14/14 122/69  04/13/14 125/69    Lab Results  Component Value Date   NA 140 04/11/2014   K 3.8 04/11/2014   CREATININE 0.69 04/11/2014    Assessment: Blood pressure control:  Controlled Progress toward BP goal:   At goal Comments: Well controlled  Plan: Medications:  continue current medications of lisinopril-HCTZ 20-25 mg daily Educational resources provided:  none Self management tools provided:  none Other plans: none

## 2014-05-26 ENCOUNTER — Encounter: Payer: Self-pay | Admitting: *Deleted

## 2014-05-26 NOTE — Progress Notes (Signed)
Internal Medicine Clinic Attending  I saw and evaluated the patient.  I personally confirmed the key portions of the history and exam documented by Dr. Richardson and I reviewed pertinent patient test results.  The assessment, diagnosis, and plan were formulated together and I agree with the documentation in the resident's note. 

## 2014-05-30 ENCOUNTER — Other Ambulatory Visit: Payer: Self-pay | Admitting: Internal Medicine

## 2014-05-30 DIAGNOSIS — IMO0001 Reserved for inherently not codable concepts without codable children: Secondary | ICD-10-CM

## 2014-05-30 DIAGNOSIS — E1165 Type 2 diabetes mellitus with hyperglycemia: Principal | ICD-10-CM

## 2014-05-30 MED ORDER — GLUCOSE BLOOD VI STRP: 1.0000 | ORAL_STRIP | Freq: Two times a day (BID) | Status: DC

## 2014-06-27 ENCOUNTER — Other Ambulatory Visit: Payer: Self-pay | Admitting: *Deleted

## 2014-06-27 DIAGNOSIS — IMO0001 Reserved for inherently not codable concepts without codable children: Secondary | ICD-10-CM

## 2014-06-27 DIAGNOSIS — E1165 Type 2 diabetes mellitus with hyperglycemia: Principal | ICD-10-CM

## 2014-07-01 MED ORDER — GLUCOSE BLOOD VI STRP: 1.0000 | ORAL_STRIP | Freq: Two times a day (BID) | Status: DC

## 2014-07-01 MED ORDER — GLYBURIDE 5 MG PO TABS: ORAL_TABLET | ORAL | Status: DC

## 2014-07-01 NOTE — Addendum Note (Signed)
Addended by: Carolan ClinesSADEK, Clayton Jarmon M on: 07/01/2014 10:23 AM   Modules accepted: Orders

## 2014-07-02 ENCOUNTER — Other Ambulatory Visit: Payer: Self-pay | Admitting: *Deleted

## 2014-07-09 ENCOUNTER — Encounter: Payer: Self-pay | Admitting: Internal Medicine

## 2014-07-18 ENCOUNTER — Ambulatory Visit (INDEPENDENT_AMBULATORY_CARE_PROVIDER_SITE_OTHER): Payer: 59 | Admitting: Internal Medicine

## 2014-07-18 ENCOUNTER — Encounter: Payer: Self-pay | Admitting: Internal Medicine

## 2014-07-18 VITALS — BP 130/75 | HR 65 | Temp 97.9°F | Ht 60.0 in | Wt 233.5 lb

## 2014-07-18 DIAGNOSIS — G4700 Insomnia, unspecified: Secondary | ICD-10-CM

## 2014-07-18 DIAGNOSIS — E119 Type 2 diabetes mellitus without complications: Secondary | ICD-10-CM

## 2014-07-18 DIAGNOSIS — IMO0001 Reserved for inherently not codable concepts without codable children: Secondary | ICD-10-CM

## 2014-07-18 DIAGNOSIS — E1165 Type 2 diabetes mellitus with hyperglycemia: Secondary | ICD-10-CM

## 2014-07-18 DIAGNOSIS — M791 Myalgia: Secondary | ICD-10-CM

## 2014-07-18 DIAGNOSIS — M7918 Myalgia, other site: Secondary | ICD-10-CM

## 2014-07-18 LAB — GLUCOSE, CAPILLARY: Glucose-Capillary: 340 mg/dL — ABNORMAL HIGH (ref 70–99)

## 2014-07-18 MED ORDER — GLUCOSE BLOOD VI STRP: 1.0000 | ORAL_STRIP | Freq: Two times a day (BID) | Status: DC

## 2014-07-18 MED ORDER — ZOLPIDEM TARTRATE ER 12.5 MG PO TBCR: 12.5000 mg | EXTENDED_RELEASE_TABLET | Freq: Every evening | ORAL | Status: DC | PRN

## 2014-07-18 MED ORDER — TRAMADOL HCL 50 MG PO TABS: 50.0000 mg | ORAL_TABLET | Freq: Four times a day (QID) | ORAL | Status: DC | PRN

## 2014-07-18 NOTE — Assessment & Plan Note (Signed)
Lab Results  Component Value Date   HGBA1C 10.2* 04/11/2014   HGBA1C 9.8 12/06/2013   HGBA1C 7.5 03/08/2013     Assessment: Diabetes control:   Progress toward A1C goal:    Comments: pt has not had any medications  Plan: Medications:   Continue metformin 1000 g twice a day and recently increase glyburide 10 mg twice a day Home glucose monitoring: Frequency:   Timing:   Instruction/counseling given: reminded to bring blood glucose meter & log to each visit, reminded to bring medications to each visit, discussed the need for weight loss and discussed diet Educational resources provided: brochure (denies) Self management tools provided:   Other plans:  Will follow-up in one month

## 2014-07-18 NOTE — Progress Notes (Signed)
Medicine attending: Medical history, presenting problems, physical findings, and medications, reviewed with Dr [name] and I concur with her evaluation and management plan. 

## 2014-07-18 NOTE — Progress Notes (Signed)
Subjective:   Patient ID: Amanda Alexander female   DOB: 05/09/1961 54 y.o.   MRN: [id number]  HPI: Amanda Alexander is a 54 y.o.  Woman with a past medical history as listed below who presents for diabetes recheck.  DM - the patient has not had any of her medications from 2-3 wks since she switched to a mail in pharmacy and there was a significant delay in her meds.thereofre she has not been able to make any of the changes addressed at the last visit.  No hypoglycemic episodes since last visit. denies polyuria, polydipsia, nausea, vomiting, diarrhea.  does not request refills today.  The pt also describes some periods of insomnia. Trouble initiating sleep she states she has tried OTC meds with little to no relief.   Past Medical History  Diagnosis Date  . Hypertension   . Diabetes mellitus without complication   . OSA on CPAP   . GERD (gastroesophageal reflux disease) 05/23/2014   Current Outpatient Prescriptions  Medication Sig Dispense Refill  . ACCU-CHEK FASTCLIX LANCETS MISC 1 Container by Does not apply route 2 (two) times daily. 102 each 0  . aspirin EC 81 MG tablet Take 81 mg by mouth daily.    . Blood Glucose Monitoring Suppl (ACCU-CHEK NANO SMARTVIEW) W/DEVICE KIT 1 Device by Does not apply route once. 1 kit 0  . glucose blood (ACCU-CHEK SMARTVIEW) test strip 1 each by Other route 2 (two) times daily at 8 am and 10 pm. Use to check blood sugar twice daily at 8am and 10pm. diag code E11.65. noninsulin dependent 100 each 12  . glucose blood (ACCU-CHEK SMARTVIEW) test strip 1 each by Other route 2 (two) times daily at 8 am and 10 pm. 100 each 12  . glyBURIDE (DIABETA) 5 MG tablet Take 10 mg (2 pills) in AM and 10 mg (2 pills) in PM 180 tablet 3  . glyBURIDE (DIABETA) 5 MG tablet Take 10 mg (2 pills) in AM and 10 mg (2 pills) in PM 360 tablet 1  . lisinopril-hydrochlorothiazide (PRINZIDE,ZESTORETIC) 20-25 MG per tablet Take 1 tablet by mouth daily. 90 tablet 6  . metFORMIN  (GLUCOPHAGE) 1000 MG tablet Take 1 tablet (1,000 mg total) by mouth 2 (two) times daily with a meal. 180 tablet 3  . pantoprazole (PROTONIX) 40 MG tablet Take 1 tablet (40 mg total) by mouth daily. 30 tablet 6  . traMADol (ULTRAM) 50 MG tablet Take 1 tablet (50 mg total) by mouth every 6 (six) hours as needed. 30 tablet 0  . zolpidem (AMBIEN CR) 12.5 MG CR tablet Take 1 tablet (12.5 mg total) by mouth at bedtime as needed for sleep. 30 tablet 1   No current facility-administered medications for this visit.   Family History  Problem Relation Age of Onset  . Cancer Father    History   Social History  . Marital Status: Widowed    Spouse Name: N/A    Number of Children: N/A  . Years of Education: N/A   Social History Main Topics  . Smoking status: Never Smoker   . Smokeless tobacco: None  . Alcohol Use: No  . Drug Use: No  . Sexual Activity: No   Other Topics Concern  . None   Social History Narrative   Review of Systems: Pertinent items are noted in HPI. Objective:  Physical Exam: Filed Vitals:   07/18/14 1625 07/18/14 1627  BP: 130/75   Pulse: 65   Temp: 97.9 F (36.6 C)  TempSrc: Oral   Height:  5' (1.524 m)  Weight:  233 lb 8 oz (105.915 kg)  SpO2: 98%    General:sitting in chair, NAD HEENT: PERRL, EOMI, no scleral icterus Cardiac: RRR, no rubs, murmurs or gallops Pulm: clear to auscultation bilaterally, moving normal volumes of air Abd: soft, nontender, nondistended, BS present Ext: warm and well perfused, no pedal edema Neuro: alert and oriented X3, cranial nerves II-XII grossly intact  Assessment & Plan:  Please see problem oriented charting  Pt discussed with Dr. Beryle Beams

## 2014-07-18 NOTE — Assessment & Plan Note (Signed)
The patient has had some trouble initiating sleep despite trying over-the-counter medications.  -Trial of Ambien

## 2014-07-18 NOTE — Patient Instructions (Signed)
General Instructions:   Please try to bring all your medicines next time. This will help us keep you safe from mistakes.   Progress Toward Treatment Goals:  Treatment Goal 04/14/2014  Hemoglobin A1C deteriorated  Blood pressure at goal    Self Care Goals & Plans:  Self Care Goal 05/23/2014  Manage my medications take my medicines as prescribed; bring my medications to every visit; refill my medications on time  Monitor my health keep track of my blood glucose; bring my glucose meter and log to each visit  Eat healthy foods eat more vegetables; eat foods that are low in salt; eat baked foods instead of fried foods  Be physically active find an activity I enjoy    Home Blood Glucose Monitoring 04/14/2014  Check my blood sugar 3 times a day  When to check my blood sugar before meals     Care Management & Community Referrals:  Referral 04/14/2014  Referrals made for care management support diabetes educator

## 2014-07-19 LAB — MICROALBUMIN / CREATININE URINE RATIO
Creatinine, Urine: 69.9 mg/dL
MICROALB/CREAT RATIO: 17.2 mg/g (ref 0.0–30.0)
Microalb, Ur: 1.2 mg/dL (ref <2.0)

## 2014-08-21 ENCOUNTER — Other Ambulatory Visit: Payer: Self-pay | Admitting: Internal Medicine

## 2014-08-25 ENCOUNTER — Telehealth: Payer: Self-pay | Admitting: *Deleted

## 2014-08-25 NOTE — Telephone Encounter (Signed)
Pt called stating the Rx for Glyburide cost over $200.  Can you change to something else?

## 2014-08-26 MED ORDER — GLIPIZIDE 10 MG PO TABS: 10.0000 mg | ORAL_TABLET | Freq: Two times a day (BID) | ORAL | Status: DC

## 2014-08-26 NOTE — Telephone Encounter (Signed)
Was able to send in glipizide at same dose. Thanks.

## 2014-09-29 ENCOUNTER — Other Ambulatory Visit: Payer: Self-pay | Admitting: Internal Medicine

## 2014-10-13 ENCOUNTER — Other Ambulatory Visit: Payer: Self-pay | Admitting: Internal Medicine

## 2014-10-15 NOTE — Telephone Encounter (Signed)
Rx called in 

## 2014-10-22 ENCOUNTER — Encounter: Payer: Self-pay | Admitting: *Deleted

## 2014-10-28 ENCOUNTER — Telehealth: Payer: Self-pay | Admitting: Dietician

## 2014-10-28 DIAGNOSIS — E119 Type 2 diabetes mellitus without complications: Secondary | ICD-10-CM

## 2014-10-28 NOTE — Telephone Encounter (Signed)
Call to patient to follow up on MNT, blood sugars. Left HIPPA compliant message

## 2014-11-19 ENCOUNTER — Other Ambulatory Visit: Payer: Self-pay | Admitting: *Deleted

## 2014-11-19 MED ORDER — GLIPIZIDE 10 MG PO TABS: 10.0000 mg | ORAL_TABLET | Freq: Two times a day (BID) | ORAL | Status: DC

## 2014-12-11 ENCOUNTER — Other Ambulatory Visit: Payer: Self-pay | Admitting: Internal Medicine

## 2014-12-12 NOTE — Telephone Encounter (Signed)
Message sent to front desk for appointment.

## 2014-12-12 NOTE — Telephone Encounter (Signed)
Desperately needs appt. A1C 10.2 in Feb. Not seen since. Dr Agustin CreeFlroes has openings end of month. If can't come that day, [facility] appt. Must be seen in next 30 days

## 2014-12-22 ENCOUNTER — Other Ambulatory Visit: Payer: Self-pay

## 2014-12-22 DIAGNOSIS — Z1231 Encounter for screening mammogram for malignant neoplasm of breast: Secondary | ICD-10-CM

## 2015-01-05 ENCOUNTER — Encounter: Payer: 59 | Admitting: Internal Medicine

## 2015-01-16 ENCOUNTER — Ambulatory Visit: Payer: 59

## 2015-01-26 ENCOUNTER — Encounter (HOSPITAL_COMMUNITY): Payer: Self-pay | Admitting: Emergency Medicine

## 2015-01-26 ENCOUNTER — Emergency Department (HOSPITAL_COMMUNITY)
Admission: EM | Admit: 2015-01-26 | Discharge: 2015-01-26 | Disposition: A | Discharge status: Home / Self Care | Payer: 59 | Attending: Emergency Medicine | Admitting: Emergency Medicine

## 2015-01-26 DIAGNOSIS — Z9111 Patient's noncompliance with dietary regimen: Secondary | ICD-10-CM

## 2015-01-26 DIAGNOSIS — Z9981 Dependence on supplemental oxygen: Secondary | ICD-10-CM | POA: Insufficient documentation

## 2015-01-26 DIAGNOSIS — Z79899 Other long term (current) drug therapy: Secondary | ICD-10-CM | POA: Diagnosis not present

## 2015-01-26 DIAGNOSIS — K219 Gastro-esophageal reflux disease without esophagitis: Secondary | ICD-10-CM | POA: Insufficient documentation

## 2015-01-26 DIAGNOSIS — G4733 Obstructive sleep apnea (adult) (pediatric): Secondary | ICD-10-CM | POA: Insufficient documentation

## 2015-01-26 DIAGNOSIS — E1165 Type 2 diabetes mellitus with hyperglycemia: Secondary | ICD-10-CM | POA: Diagnosis not present

## 2015-01-26 DIAGNOSIS — Z91119 Patient's noncompliance with dietary regimen due to unspecified reason: Secondary | ICD-10-CM

## 2015-01-26 DIAGNOSIS — Z7982 Long term (current) use of aspirin: Secondary | ICD-10-CM | POA: Insufficient documentation

## 2015-01-26 DIAGNOSIS — Z9119 Patient's noncompliance with other medical treatment and regimen: Secondary | ICD-10-CM | POA: Diagnosis not present

## 2015-01-26 DIAGNOSIS — I10 Essential (primary) hypertension: Secondary | ICD-10-CM | POA: Diagnosis not present

## 2015-01-26 LAB — CBG MONITORING, ED: Glucose-Capillary: 353 mg/dL — ABNORMAL HIGH (ref 65–99)

## 2015-01-26 LAB — CBC
HCT: 35.9 % — ABNORMAL LOW (ref 36.0–46.0)
Hemoglobin: 11.9 g/dL — ABNORMAL LOW (ref 12.0–15.0)
MCH: 29.2 pg (ref 26.0–34.0)
MCHC: 33.1 g/dL (ref 30.0–36.0)
MCV: 88 fL (ref 78.0–100.0)
PLATELETS: 288 10*3/uL (ref 150–400)
RBC: 4.08 MIL/uL (ref 3.87–5.11)
RDW: 14.2 % (ref 11.5–15.5)
WBC: 5.8 10*3/uL (ref 4.0–10.5)

## 2015-01-26 LAB — URINE MICROSCOPIC-ADD ON

## 2015-01-26 LAB — URINALYSIS, ROUTINE W REFLEX MICROSCOPIC
Bilirubin Urine: NEGATIVE
Ketones, ur: 15 mg/dL — AB
Nitrite: NEGATIVE
PROTEIN: NEGATIVE mg/dL
Specific Gravity, Urine: 1.038 — ABNORMAL HIGH (ref 1.005–1.030)
UROBILINOGEN UA: 0.2 mg/dL (ref 0.0–1.0)
pH: 5 (ref 5.0–8.0)

## 2015-01-26 LAB — BASIC METABOLIC PANEL
ANION GAP: 12 (ref 5–15)
BUN: 16 mg/dL (ref 6–20)
CALCIUM: 9.3 mg/dL (ref 8.9–10.3)
CO2: 26 mmol/L (ref 22–32)
CREATININE: 0.83 mg/dL (ref 0.44–1.00)
Chloride: 99 mmol/L — ABNORMAL LOW (ref 101–111)
Glucose, Bld: 357 mg/dL — ABNORMAL HIGH (ref 65–99)
Potassium: 3.6 mmol/L (ref 3.5–5.1)
SODIUM: 137 mmol/L (ref 135–145)

## 2015-01-26 NOTE — Discharge Instructions (Signed)
Blood Glucose Monitoring °Monitoring your blood glucose (also know as blood sugar) helps you to manage your diabetes. It also helps you and your health care provider monitor your diabetes and determine how well your treatment plan is working. °WHY SHOULD YOU MONITOR YOUR BLOOD GLUCOSE? °· It can help you understand how food, exercise, and medicine affect your blood glucose. °· It allows you to know what your blood glucose is at any given moment. You can quickly tell if you are having low blood glucose (hypoglycemia) or high blood glucose (hyperglycemia). °· It can help you and your health care provider know how to adjust your medicines. °· It can help you understand how to manage an illness or adjust medicine for exercise. °WHEN SHOULD YOU TEST? °Your health care provider will help you decide how often you should check your blood glucose. This may depend on the type of diabetes you have, your diabetes control, or the types of medicines you are taking. Be sure to write down all of your blood glucose readings so that this information can be reviewed with your health care provider. See below for examples of testing times that your health care provider may suggest. °Type 1 Diabetes °· Test 4 times a day if you are in good control, using an insulin pump, or perform multiple daily injections. °· If your diabetes is not well controlled or if you are sick, you may need to monitor more often. °· It is a good idea to also monitor: °· Before and after exercise. °· Between meals and 2 hours after a meal. °· Occasionally between 2:00 a.m. and 3:00 a.m. °Type 2 Diabetes °· It can vary with each person, but generally, if you are on insulin, test 4 times a day. °· If you take medicines by mouth (orally), test 2 times a day. °· If you are on a controlled diet, test once a day. °· If your diabetes is not well controlled or if you are sick, you may need to monitor more often. °HOW TO MONITOR YOUR BLOOD GLUCOSE °Supplies  Needed °· Blood glucose meter. °· Test strips for your meter. Each meter has its own strips. You must use the strips that go with your own meter. °· A pricking needle (lancet). °· A device that holds the lancet (lancing device). °· A journal or log book to write down your results. °Procedure °· Wash your hands with soap and water. Alcohol is not preferred. °· Prick the side of your finger (not the tip) with the lancet. °· Gently milk the finger until a small drop of blood appears. °· Follow the instructions that come with your meter for inserting the test strip, applying blood to the strip, and using your blood glucose meter. °Other Areas to Get Blood for Testing °Some meters allow you to use other areas of your body (other than your finger) to test your blood. These areas are called alternative sites. The most common alternative sites are: °· The forearm. °· The thigh. °· The back area of the lower leg. °· The palm of the hand. °The blood flow in these areas is slower. Therefore, the blood glucose values you get may be delayed, and the numbers are different from what you would get from your fingers. Do not use alternative sites if you think you are having hypoglycemia. Your reading will not be accurate. Always use a finger if you are having hypoglycemia. Also, if you cannot feel your lows (hypoglycemia unawareness), always use your fingers for your   blood glucose checks. °ADDITIONAL TIPS FOR GLUCOSE MONITORING °· Do not reuse lancets. °· Always carry your supplies with you. °· All blood glucose meters have a 24-hour "hotline" number to call if you have questions or need help. °· Adjust (calibrate) your blood glucose meter with a control solution after finishing a few boxes of strips. °BLOOD GLUCOSE RECORD KEEPING °It is a good idea to keep a daily record or log of your blood glucose readings. Most glucose meters, if not all, keep your glucose records stored in the meter. Some meters come with the ability to download  your records to your home computer. Keeping a record of your blood glucose readings is especially helpful if you are wanting to look for patterns. Make notes to go along with the blood glucose readings because you might forget what happened at that exact time. Keeping good records helps you and your health care provider to work together to achieve good diabetes management.  °Document Released: 06/02/2003 Document Revised: 10/14/2013 Document Reviewed: 10/22/2012 °[employer]® Patient Information ©2015 [employer]. This information is not intended to replace advice given to you by your health care provider. Make sure you discuss any questions you have with your health care provider. ° °Diabetes and Exercise °Exercising regularly is important. It is not just about losing weight. It has many health benefits, such as: °· Improving your overall fitness, flexibility, and endurance. °· Increasing your bone density. °· Helping with weight control. °· Decreasing your body fat. °· Increasing your muscle strength. °· Reducing stress and tension. °· Improving your overall health. °People with diabetes who exercise gain additional benefits because exercise: °· Reduces appetite. °· Improves the body's use of blood sugar (glucose). °· Helps lower or control blood glucose. °· Decreases blood pressure. °· Helps control blood lipids (such as cholesterol and triglycerides). °· Improves the body's use of the hormone insulin by: °· Increasing the body's insulin sensitivity. °· Reducing the body's insulin needs. °· Decreases the risk for heart disease because exercising: °· Lowers cholesterol and triglycerides levels. °· Increases the levels of good cholesterol (such as high-density lipoproteins [HDL]) in the body. °· Lowers blood glucose levels. °YOUR ACTIVITY PLAN  °Choose an activity that you enjoy and set realistic goals. Your health care provider or diabetes educator can help you make an activity plan that works for you. Exercise  regularly as directed by your health care provider. This includes: °· Performing resistance training twice a week such as push-ups, sit-ups, lifting weights, or using resistance bands. °· Performing 150 minutes of cardio exercises each week such as walking, running, or playing sports. °· Staying active and spending no more than 90 minutes at one time being inactive. °Even short bursts of exercise are good for you. Three 10-minute sessions spread throughout the day are just as beneficial as a single 30-minute session. Some exercise ideas include: °· Taking the dog for a walk. °· Taking the stairs instead of the elevator. °· Dancing to your favorite song. °· Doing an exercise video. °· Doing your favorite exercise with a friend. °RECOMMENDATIONS FOR EXERCISING WITH TYPE 1 OR TYPE 2 DIABETES  °· Check your blood glucose before exercising. If blood glucose levels are greater than 240 mg/dL, check for urine ketones. Do not exercise if ketones are present. °· Avoid injecting insulin into areas of the body that are going to be exercised. For example, avoid injecting insulin into: °¨ The arms when playing tennis. °¨ The legs when jogging. °· Keep a record of: °¨   Food intake before and after you exercise. °¨ Expected peak times of insulin action. °¨ Blood glucose levels before and after you exercise. °¨ The type and amount of exercise you have done. °· Review your records with your health care provider. Your health care provider will help you to develop guidelines for adjusting food intake and insulin amounts before and after exercising. °· If you take insulin or oral hypoglycemic agents, watch for signs and symptoms of hypoglycemia. They include: °¨ Dizziness. °¨ Shaking. °¨ Sweating. °¨ Chills. °¨ Confusion. °· Drink plenty of water while you exercise to prevent dehydration or heat stroke. Body water is lost during exercise and must be replaced. °· Talk to your health care provider before starting an exercise program to  make sure it is safe for you. Remember, almost any type of activity is better than none. °Document Released: 08/20/2003 Document Revised: 10/14/2013 Document Reviewed: 11/06/2012 °[employer]® Patient Information ©2015 [employer]. This information is not intended to replace advice given to you by your health care provider. Make sure you discuss any questions you have with your health care provider. ° °Diabetes Mellitus and Food °It is important for you to manage your blood sugar (glucose) level. Your blood glucose level can be greatly affected by what you eat. Eating healthier foods in the appropriate amounts throughout the day at about the same time each day will help you control your blood glucose level. It can also help slow or prevent worsening of your diabetes mellitus. Healthy eating may even help you improve the level of your blood pressure and reach or maintain a healthy weight.  °HOW CAN FOOD AFFECT ME? °Carbohydrates °Carbohydrates affect your blood glucose level more than any other type of food. Your dietitian will help you determine how many carbohydrates to eat at each meal and teach you how to count carbohydrates. Counting carbohydrates is important to keep your blood glucose at a healthy level, especially if you are using insulin or taking certain medicines for diabetes mellitus. °Alcohol °Alcohol can cause sudden decreases in blood glucose (hypoglycemia), especially if you use insulin or take certain medicines for diabetes mellitus. Hypoglycemia can be a life-threatening condition. Symptoms of hypoglycemia (sleepiness, dizziness, and disorientation) are similar to symptoms of having too much alcohol.  °If your health care provider has given you approval to drink alcohol, do so in moderation and use the following guidelines: °· Women should not have more than one drink per day, and men should not have more than two drinks per day. One drink is equal to: °¨ 12 oz of beer. °¨ 5 oz of wine. °¨ 1½ oz of  hard liquor. °· Do not drink on an empty stomach. °· Keep yourself hydrated. Have water, diet soda, or unsweetened iced tea. °· Regular soda, juice, and other mixers might contain a lot of carbohydrates and should be counted. °WHAT FOODS ARE NOT RECOMMENDED? °As you make food choices, it is important to remember that all foods are not the same. Some foods have fewer nutrients per serving than other foods, even though they might have the same number of calories or carbohydrates. It is difficult to get your body what it needs when you eat foods with fewer nutrients. Examples of foods that you should avoid that are high in calories and carbohydrates but low in nutrients include: °· Trans fats (most processed foods list trans fats on the Nutrition Facts label). °· Regular soda. °· Juice. °· Candy. °· Sweets, such as cake, pie, doughnuts, and cookies. °·   Fried foods. WHAT FOODS CAN I EAT? Have nutrient-rich foods, which will nourish your body and keep you healthy. The food you should eat also will depend on several factors, including:  The calories you need.  The medicines you take.  Your weight.  Your blood glucose level.  Your blood pressure level.  Your cholesterol level. You also should eat a variety of foods, including:  Protein, such as meat, poultry, fish, tofu, nuts, and seeds (lean animal proteins are best).  Fruits.  Vegetables.  Dairy products, such as milk, cheese, and yogurt (low fat is best).  Breads, grains, pasta, cereal, rice, and beans.  Fats such as olive oil, trans fat-free margarine, canola oil, avocado, and olives. DOES EVERYONE WITH DIABETES MELLITUS HAVE THE SAME MEAL PLAN? Because every person with diabetes mellitus is different, there is not one meal plan that works for everyone. It is very important that you meet with a dietitian who will help you create a meal plan that is just right for you. Document Released: 02/24/2005 Document Revised: 06/04/2013 Document  Reviewed: 04/26/2013 [facility] Patient Information 2015 [place], [place]. This information is not intended to replace advice given to you by your health care provider. Make sure you discuss any questions you have with your health care provider.

## 2015-01-26 NOTE — ED Notes (Signed)
Pt. reports elevated blood sugar at home this evening =[phone] , denies pain or discomfort , alert and oriented /respirations unlabored , no fever or chills. Pt. took her Metformin and Glyburide this afternoon .

## 2015-01-26 NOTE — ED Provider Notes (Signed)
CSN: [id number]     Arrival date & time 01/26/15  1902 History   First MD Initiated Contact with Patient 01/26/15 2043     Chief Complaint  Patient presents with  . Hyperglycemia     (Consider location/radiation/quality/duration/timing/severity/associated sxs/prior Treatment) HPI Patient reports that she took her blood sugar at home this evening and it was at 510. She has not been monitoring her blood sugar very frequently. She reports that she took it today because she felt a little unwell and she noticed the pain in her right forearm that occurred if she bent her arm. Patient's forearm has an uncomfortable sensation if she flexes at the elbow. She reports that she thought that stroke symptoms started on the right side and that was one of her main concerns in getting evaluated this evening. She reports that she has not been following a diet for her diabetes. She reports that for some time she just eats what she wants to eat. Today, that included ham and french fries and some HIBACHI food. She denies chest pain or shortness of breath. She has not had lightheadedness or headache. No recent fever or vomiting. Patient poor several episodes of diarrhea yesterday. She reports diarrhea occur sporadically. Past Medical History  Diagnosis Date  . Hypertension   . Diabetes mellitus without complication   . OSA on CPAP   . GERD (gastroesophageal reflux disease) 05/23/2014   Past Surgical History  Procedure Laterality Date  . Tonsillectomy Bilateral    Family History  Problem Relation Age of Onset  . Cancer Father    Social History  Substance Use Topics  . Smoking status: Never Smoker   . Smokeless tobacco: None  . Alcohol Use: No   OB History    No data available     Review of Systems  10 Systems reviewed and are negative for acute change except as noted in the HPI.   Allergies  Review of patient's allergies indicates no known allergies.  Home Medications   Prior to Admission  medications   Medication Sig Start Date End Date Taking? Authorizing Provider  ACCU-CHEK FASTCLIX LANCETS MISC 1 Container by Does not apply route 2 (two) times daily. 12/06/13   Jerrye Noble, MD  aspirin EC 81 MG tablet Take 81 mg by mouth daily.    Historical Provider, MD  Blood Glucose Monitoring Suppl (ACCU-CHEK NANO SMARTVIEW) W/DEVICE KIT 1 Device by Does not apply route once. 12/06/13   Jerrye Noble, MD  glipiZIDE (GLUCOTROL) 10 MG tablet Take 1 tablet (10 mg total) by mouth 2 (two) times daily before a meal. 11/19/14   Jerrye Noble, MD  glucose blood (ACCU-CHEK SMARTVIEW) test strip 1 each by Other route 2 (two) times daily at 8 am and 10 pm. Use to check blood sugar twice daily at 8am and 10pm. diag code E11.65. noninsulin dependent 07/01/14   Jerrye Noble, MD  glucose blood (ACCU-CHEK SMARTVIEW) test strip 1 each by Other route 2 (two) times daily at 8 am and 10 pm. 07/18/14   Jerrye Noble, MD  lisinopril-hydrochlorothiazide (PRINZIDE,ZESTORETIC) 20-25 MG per tablet Take 1 tablet by mouth daily. 03/20/14   Jerrye Noble, MD  lisinopril-hydrochlorothiazide (PRINZIDE,ZESTORETIC) 20-25 MG per tablet TAKE 1 TABLET BY MOUTH DAILY. 09/30/14   Jerrye Noble, MD  metFORMIN (GLUCOPHAGE) 1000 MG tablet TAKE 1 TABLET BY MOUTH TWICE A DAY WITH MEALS 12/12/14   Bartholomew Crews, MD  pantoprazole (PROTONIX) 40 MG tablet Take 1 tablet (  40 mg total) by mouth daily. 05/23/14   Alexa Sherral Hammers, MD  traMADol (ULTRAM) 50 MG tablet TAKE 1 TABLET BY MOUTH EVERY 6 HOURS AS NEEDED 10/15/14   Jerrye Noble, MD  zolpidem (AMBIEN CR) 12.5 MG CR tablet Take 1 tablet (12.5 mg total) by mouth at bedtime as needed for sleep. 07/18/14 07/18/15  Jerrye Noble, MD   BP 128/74 mmHg  Pulse 69  Temp(Src) 98.3 F (36.8 C) (Oral)  Resp 18  Ht 5' (1.524 m)  Wt 225 lb 9 oz (102.314 kg)  BMI 44.05 kg/m2  SpO2 100% Physical Exam  Constitutional: She is oriented to person, place, and time. She appears well-developed and well-nourished.   Patient is alert and nontoxic. She has no respiratory distress. He is moderately obese.  HENT:  Head: Normocephalic and atraumatic.  Mouth/Throat: Oropharynx is clear and moist.  Eyes: EOM are normal. Pupils are equal, round, and reactive to light.  Neck: Neck supple.  Cardiovascular: Normal rate, regular rhythm, normal heart sounds and intact distal pulses.   Pulmonary/Chest: Effort normal and breath sounds normal.  Abdominal: Soft. Bowel sounds are normal. She exhibits no distension. There is no tenderness.  Musculoskeletal: Normal range of motion. She exhibits tenderness (patient reports that her forearm is tender along the volar, ulnar aspect when she bends it at the elbow. There is no palpable soft tissue abnormality. The patient is neurovascularly intact. No evidence of effusion of the joint.). She exhibits no edema.  Neurological: She is alert and oriented to person, place, and time. She has normal strength. No cranial nerve deficit. She exhibits normal muscle tone. Coordination normal. GCS eye subscore is 4. GCS verbal subscore is 5. GCS motor subscore is 6.  Skin: Skin is warm, dry and intact.  Psychiatric: She has a normal mood and affect.    ED Course  Procedures (including critical care time) Labs Review Labs Reviewed  BASIC METABOLIC PANEL - Abnormal; Notable for the following:    Chloride 99 (*)    Glucose, Bld 357 (*)    All other components within normal limits  CBC - Abnormal; Notable for the following:    Hemoglobin 11.9 (*)    HCT 35.9 (*)    All other components within normal limits  URINALYSIS, ROUTINE W REFLEX MICROSCOPIC (NOT AT [facility]) - Abnormal; Notable for the following:    APPearance CLOUDY (*)    Specific Gravity, Urine 1.038 (*)    Glucose, UA >1000 (*)    Hgb urine dipstick MODERATE (*)    Ketones, ur 15 (*)    Leukocytes, UA SMALL (*)    All other components within normal limits  URINE MICROSCOPIC-ADD ON - Abnormal; Notable for the following:     Squamous Epithelial / LPF MANY (*)    Bacteria, UA FEW (*)    All other components within normal limits  CBG MONITORING, ED - Abnormal; Notable for the following:    Glucose-Capillary 353 (*)    All other components within normal limits  CBG MONITORING, ED    Imaging Review No results found. I, Charlesetta Shanks, personally reviewed and evaluated these images and lab results as part of my medical decision-making.   EKG Interpretation None      MDM   Final diagnoses:  Hyperglycemia due to type 2 diabetes mellitus  Dietary noncompliance   Patient presents with uncomplicated hyperglycemia due to dietary noncompliance. The patient reports it has been proposed to her in the past that she may  need to be put on insulin. At this time no evidence of acidosis or other acute complications diabetes. The patient's main concern was for that notion that right forearm discomfort might represent a CVA. At this time her forearm symptoms are most consistent with musculoskeletal discomfort. The patient is counseled on dietary methods and advised to follow-up this week for ongoing diabetic management with her PCP. She is also counseled on signs and symptoms of stroke for future reference.    Charlesetta Shanks, MD 01/26/15 2124

## 2015-03-13 ENCOUNTER — Ambulatory Visit
Admission: RE | Admit: 2015-03-13 | Discharge: 2015-03-13 | Disposition: A | Discharge status: Home / Self Care | Payer: Managed Care, Other (non HMO) | Source: Ambulatory Visit

## 2015-03-13 DIAGNOSIS — Z1231 Encounter for screening mammogram for malignant neoplasm of breast: Secondary | ICD-10-CM

## 2015-03-25 ENCOUNTER — Other Ambulatory Visit: Payer: Self-pay | Admitting: Internal Medicine

## 2015-08-21 ENCOUNTER — Other Ambulatory Visit: Payer: Self-pay | Admitting: Internal Medicine

## 2015-09-02 ENCOUNTER — Encounter: Payer: Self-pay | Admitting: Internal Medicine

## 2015-09-11 ENCOUNTER — Telehealth: Payer: Self-pay | Admitting: Internal Medicine

## 2015-09-11 NOTE — Telephone Encounter (Signed)
APPT. REMINDER CALL, LMTCB °

## 2015-09-14 ENCOUNTER — Encounter: Payer: Managed Care, Other (non HMO) | Admitting: Internal Medicine

## 2016-03-23 ENCOUNTER — Other Ambulatory Visit: Payer: Self-pay | Admitting: Internal Medicine

## 2016-03-23 DIAGNOSIS — Z1231 Encounter for screening mammogram for malignant neoplasm of breast: Secondary | ICD-10-CM

## 2016-03-28 ENCOUNTER — Ambulatory Visit
Admission: RE | Admit: 2016-03-28 | Discharge: 2016-03-28 | Disposition: A | Discharge status: Home / Self Care | Payer: Managed Care, Other (non HMO) | Source: Ambulatory Visit | Attending: Internal Medicine | Admitting: Internal Medicine

## 2016-03-28 DIAGNOSIS — Z1231 Encounter for screening mammogram for malignant neoplasm of breast: Secondary | ICD-10-CM

## 2016-05-27 ENCOUNTER — Telehealth: Payer: Self-pay

## 2016-05-27 NOTE — Telephone Encounter (Signed)
NOTES SENT TO SCHEDULING.  °

## 2016-06-08 ENCOUNTER — Telehealth: Payer: Self-pay | Admitting: Cardiovascular Disease

## 2016-06-08 NOTE — Telephone Encounter (Signed)
Received records from [facility] for appointment on 07/15/16 with Dr Duke Salviaandolph.  [facility] Dr Leonides Sakeandolph's schedule on 07/15/16. lp

## 2016-07-15 ENCOUNTER — Encounter: Payer: Self-pay | Admitting: Cardiovascular Disease

## 2016-07-15 ENCOUNTER — Ambulatory Visit (INDEPENDENT_AMBULATORY_CARE_PROVIDER_SITE_OTHER): Payer: BLUE CROSS/BLUE SHIELD | Admitting: Cardiovascular Disease

## 2016-07-15 VITALS — BP 138/70 | HR 72 | Ht 60.0 in | Wt 232.0 lb

## 2016-07-15 DIAGNOSIS — R0789 Other chest pain: Secondary | ICD-10-CM

## 2016-07-15 DIAGNOSIS — R600 Localized edema: Secondary | ICD-10-CM

## 2016-07-15 DIAGNOSIS — R0602 Shortness of breath: Secondary | ICD-10-CM | POA: Diagnosis not present

## 2016-07-15 DIAGNOSIS — I10 Essential (primary) hypertension: Secondary | ICD-10-CM

## 2016-07-15 HISTORY — DX: Localized edema: R60.0

## 2016-07-15 MED ORDER — FUROSEMIDE 40 MG PO TABS: 40.0000 mg | ORAL_TABLET | Freq: Every day | ORAL | 5 refills | Status: DC

## 2016-07-15 MED ORDER — LISINOPRIL 40 MG PO TABS: 40.0000 mg | ORAL_TABLET | Freq: Every day | ORAL | 5 refills | Status: DC

## 2016-07-15 NOTE — Patient Instructions (Signed)
Medication Instructions:  STOP HYDROCHLOROTHIAZIDE - LISINOPRIL   START FUROSEMIDE 40 MG DAILY  START LISINOPRIL 40 MG DAILY   Labwork: NONE  Testing/Procedures: Your physician has requested that you have an echocardiogram. Echocardiography is a painless test that uses sound waves to create images of your heart. It provides your doctor with information about the size and shape of your heart and how well your heart's chambers and valves are working. This procedure takes approximately one hour. There are no restrictions for this procedure. [facility] [address]   Follow-Up: Your physician recommends that you schedule a follow-up appointment in: 2 WEEKS   If you need a refill on your cardiac medications before your next appointment, please call your pharmacy.

## 2016-07-15 NOTE — Progress Notes (Signed)
Cardiology Office Note   Date:  07/15/2016   ID:  Amanda Alexander, DOB 06/14/1960, MRN [id number]  PCP:  Amanda Greenland, MD  Cardiologist:   Amanda Latch, MD   Chief Complaint  Patient presents with  . New Patient (Initial Visit)      History of Present Illness: Amanda Alexander is a 56 y.o. female with diabetes and hypertension who presents for an evaluation of chest pain.   One month ago she had an episode of chest pain while playing with her grandson.  It occurred after putting her grandson on her shoulders.  She noted a sharp sternal chest pain that lasted for several days. There is no associated shortness of breath, nausea, or diaphoresis. The pain lasted for several seconds at a time and was worse with movement. She saw Dr. Glendale Alexander on 05/23/16 and reported one week of exertional chest pain.  At that appointment she was noted to have anterior chest wall tenderness to palpation. EKG reportedly showed sinus rhythm and no ischemic changes. She was referred to cardiology for further evaluation.  Amanda Alexander also reports bilateral lower extremity edema to the ankles. This typically improves with elevation of her legs and recurs by the end of the following day. She denies orthopnea or PND. She has a CPAP machine but is at approximately 3 nights per week. She doesn't use it more frequently because it is uncomfortable for her.  Amanda Alexander  walks a lot at work but does not get any formal exercise. 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    Past Medical History:  Diagnosis Date  . Diabetes mellitus without complication ([place])   . GERD (gastroesophageal reflux disease) 05/23/2014  . Hypertension   . Lower extremity edema 07/15/2016  . OSA on CPAP     Past Surgical History:  Procedure Laterality Date  . TONSILLECTOMY Bilateral      Current Outpatient Prescriptions  Medication Sig Dispense Refill  . ACCU-CHEK FASTCLIX LANCETS MISC 1 Container  by Does not apply route 2 (two) times daily. 102 each 0  . aspirin EC 81 MG tablet Take 81 mg by mouth daily.    . Blood Glucose Monitoring Suppl (ACCU-CHEK NANO SMARTVIEW) W/DEVICE KIT 1 Device by Does not apply route once. 1 kit 0  . glipiZIDE (GLUCOTROL) 10 MG tablet Take 1 tablet (10 mg total) by mouth 2 (two) times daily before a meal. 60 tablet 1  . glucose blood (ACCU-CHEK SMARTVIEW) test strip 1 each by Other route 2 (two) times daily at 8 am and 10 pm. Use to check blood sugar twice daily at 8am and 10pm. diag code E11.65. noninsulin dependent 100 each 12  . glyBURIDE (DIABETA) 5 MG tablet Take 10 mg by mouth 2 (two) times daily.  0  . metFORMIN (GLUCOPHAGE) 1000 MG tablet Take 1 tablet by mouth  twice a day with meals 180 tablet 6  . pantoprazole (PROTONIX) 40 MG tablet Take 1 tablet (40 mg total) by mouth daily. 30 tablet 6  . TOUJEO SOLOSTAR 300 UNIT/ML SOPN Inject 38 Units into the skin daily.  3  . traMADol (ULTRAM) 50 MG tablet Take 50 mg by mouth every 8 (eight) hours as needed.  1  . furosemide (LASIX) 40 MG tablet Take 1 tablet (40 mg total) by mouth daily. 30 tablet 5  . lisinopril (PRINIVIL,ZESTRIL) 40 MG tablet Take 1 tablet (40 mg total) by  mouth daily. 30 tablet 5   No current facility-administered medications for this visit.     Allergies:   Patient has no known allergies.    Social History:  The patient  reports that she has never smoked. She has never used smokeless tobacco. She reports that she does not drink alcohol or use drugs.   Family History:  The patient's family history includes Arthritis in her sister; Breast cancer in her sister; COPD in her sister; Cancer in her sister; Diabetes in her brother and mother; Heart disease in her mother; Hyperlipidemia in her brother; Lung cancer in her father; Other in her brother and father; Sleep apnea in her son.    ROS:  Please see the history of present illness.   Otherwise, review of systems are positive for none.    All other systems are reviewed and negative.    PHYSICAL EXAM: VS:  BP 138/70 (BP Location: Right Arm, Patient Position: Sitting, Cuff Size: Large)   Pulse 72   Ht 5' (1.524 m)   Wt 105.2 kg (232 lb)   BMI 45.31 kg/m  , BMI Body mass index is 45.31 kg/m. GENERAL:  Well appearing HEENT:  Pupils equal round and reactive, fundi not visualized, oral mucosa unremarkable NECK:  JVP 2cm above the clavicle at 45 degrees.  Waveform within normal limits, carotid upstroke brisk and symmetric, no bruits, no thyromegaly LYMPHATICS:  No cervical adenopathy LUNGS:  Clear to auscultation bilaterally HEART:  RRR.  PMI not displaced or sustained,S1 and S2 within normal limits, no S3, no S4, no clicks, no rubs, no murmurs ABD:  Flat, positive bowel sounds normal in frequency in pitch, no bruits, no rebound, no guarding, no midline pulsatile mass, no hepatomegaly, no splenomegaly EXT:  2 plus pulses throughout, 1+ pitting edema to above the ankles bilaterally, no cyanosis no clubbing SKIN:  No rashes no nodules NEURO:  Cranial nerves II through XII grossly intact, motor grossly intact throughout PSYCH:  Cognitively intact, oriented to person place and time   EKG:  EKG is ordered today. The ekg ordered today demonstrates sinus rhythm.  Rate 67 bpm    Recent Labs: No results found for requested labs within last 8760 hours.   04/11/16: Sodium 140, potassium 4.1, BUN 14, creatinine 0.7 AST 11, ALT 12 Total cholesterol 146, triglycerides 132, HDL 49, LDL 71 Hemoglobin A1c 7.3%  Lipid Panel    Component Value Date/Time   CHOL 157 04/13/2014 0420   TRIG 140 04/13/2014 0420   HDL 47 04/13/2014 0420   CHOLHDL 3.3 04/13/2014 0420   VLDL 28 04/13/2014 0420   LDLCALC 82 04/13/2014 0420      Wt Readings from Last 3 Encounters:  07/15/16 105.2 kg (232 lb)  01/26/15 102.3 kg (225 lb 9 oz)  07/18/14 105.9 kg (233 lb 8 oz)      ASSESSMENT AND PLAN:  # Atypical chest pain: Symptoms are more  consistent with musculoskeletal strain than ischemia.  No stress testing indicated.  # Shortness of breath: # LE Edema: # Hypertension: Ms. Amanda Alexander is volume overloaded on exam.  Check an echocardiogram. We will stop HCTZ-lisinopril and Toprol to 40 mg daily and start Lasix 40 mg daily. We will repeat a basic metabolic panel at her follow-up appointment.     Current medicines are reviewed at length with the patient today.  The patient does not have concerns regarding medicines.  The following changes have been made:  Stop HCTZ-lisinopril  Increase lisinopril to 40 mg  daily and start Lasix 40 mg   Labs/ tests ordered today include:   Orders Placed This Encounter  Procedures  . EKG 12-Lead  . ECHOCARDIOGRAM COMPLETE     Disposition:   FU with Cherly Erno C. Oval Linsey, MD, [facility] in 2 weeks    This note was written with the assistance of speech recognition software.  Please excuse any transcriptional errors.  Signed, Khanh Tanori C. Oval Linsey, MD, [facility]  07/15/2016 5:10 PM    [place]

## 2016-08-01 ENCOUNTER — Ambulatory Visit (HOSPITAL_COMMUNITY): Payer: BLUE CROSS/BLUE SHIELD | Attending: Cardiology

## 2016-08-01 ENCOUNTER — Other Ambulatory Visit: Payer: Self-pay

## 2016-08-01 DIAGNOSIS — I10 Essential (primary) hypertension: Secondary | ICD-10-CM | POA: Insufficient documentation

## 2016-08-01 DIAGNOSIS — E669 Obesity, unspecified: Secondary | ICD-10-CM | POA: Insufficient documentation

## 2016-08-01 DIAGNOSIS — Z6841 Body Mass Index (BMI) 40.0 and over, adult: Secondary | ICD-10-CM | POA: Insufficient documentation

## 2016-08-01 DIAGNOSIS — R0602 Shortness of breath: Secondary | ICD-10-CM | POA: Insufficient documentation

## 2016-08-01 DIAGNOSIS — I081 Rheumatic disorders of both mitral and tricuspid valves: Secondary | ICD-10-CM | POA: Insufficient documentation

## 2016-08-01 DIAGNOSIS — E119 Type 2 diabetes mellitus without complications: Secondary | ICD-10-CM | POA: Insufficient documentation

## 2016-08-04 ENCOUNTER — Telehealth: Payer: Self-pay | Admitting: *Deleted

## 2016-08-04 NOTE — Telephone Encounter (Signed)
-----   Message from Chilton Siiffany [place], MD sent at 08/02/2016 10:38 AM EST ----- Echo shows that her heart squeezes well but does not relax completely.  This is a mild change and will not cause symptoms unless it worsens.  It will be important to keep her blood pressure under good control. Mild leaking of the tricuspid valve.  This does not explain her shortness of breath or swelling.

## 2016-08-04 NOTE — Telephone Encounter (Signed)
Left message to call back  

## 2016-08-12 ENCOUNTER — Ambulatory Visit (INDEPENDENT_AMBULATORY_CARE_PROVIDER_SITE_OTHER): Payer: BLUE CROSS/BLUE SHIELD | Admitting: Cardiovascular Disease

## 2016-08-12 ENCOUNTER — Encounter: Payer: Self-pay | Admitting: Cardiovascular Disease

## 2016-08-12 VITALS — BP 118/64 | HR 66 | Ht 60.0 in | Wt 234.0 lb

## 2016-08-12 DIAGNOSIS — I739 Peripheral vascular disease, unspecified: Secondary | ICD-10-CM | POA: Diagnosis not present

## 2016-08-12 DIAGNOSIS — I10 Essential (primary) hypertension: Secondary | ICD-10-CM | POA: Diagnosis not present

## 2016-08-12 DIAGNOSIS — Z5181 Encounter for therapeutic drug level monitoring: Secondary | ICD-10-CM | POA: Diagnosis not present

## 2016-08-12 DIAGNOSIS — R079 Chest pain, unspecified: Secondary | ICD-10-CM | POA: Diagnosis not present

## 2016-08-12 LAB — BASIC METABOLIC PANEL
BUN: 17 mg/dL (ref 7–25)
CALCIUM: 9.4 mg/dL (ref 8.6–10.4)
CO2: 29 mmol/L (ref 20–31)
Chloride: 104 mmol/L (ref 98–110)
Creat: 0.72 mg/dL (ref 0.50–1.05)
Glucose, Bld: 128 mg/dL — ABNORMAL HIGH (ref 65–99)
Potassium: 4.5 mmol/L (ref 3.5–5.3)
SODIUM: 140 mmol/L (ref 135–146)

## 2016-08-12 NOTE — Patient Instructions (Signed)
Medication Instructions:  Your physician recommends that you continue on your current medications as directed. Please refer to the Current Medication list given to you today.  Labwork: BMET AT [facility] ON THE FIRST FLOOR  Testing/Procedures: Your physician has requested that you have an ankle brachial index (ABI). During this test an ultrasound and blood pressure cuff are used to evaluate the arteries that supply the arms and legs with blood. Allow thirty minutes for this exam. There are no restrictions or special instructions.  Your physician has requested that you have an exercise tolerance test. For further information please visit [email or web address]. Please also follow instruction sheet, as given.  Follow-Up: Your physician recommends that you schedule a follow-up appointment in: 1 MONTH OV  If you need a refill on your cardiac medications before your next appointment, please call your pharmacy.

## 2016-08-12 NOTE — Progress Notes (Signed)
Cardiology Office Note   Date:  08/12/2016   ID:  Amanda Alexander, DOB [birth year], MRN [id number]  PCP:  Maximino Greenland, MD  Cardiologist:   Skeet Latch, MD   No chief complaint on file.     History of Present Illness: Amanda Alexander is a 56 y.o. female with diabetes and hypertension who presents for follow up. Amanda Alexander was first seen 07/15/16 with atypical chest pain that occurred after lifting her grandson. It was clearly non-exertional and no further ischemia evaluation was indicated. At that appointment her blood pressure was poorly-controlled.  She also reported shortness of breath and had lower extremity edema. An echocardiogram was obtained 08/01/16 that revealed LVEF 65-70% with grade 1 diastolic dysfunction. There was mild mitral regurgitation and mild tricuspid regurgitation.  Hydrochlorothiazide was switched to Lasix.  Since her last appointment Amanda Alexander continues to have some chest discomfort.  She now notes it when she is resting. She had her grandchild and was rushing earlier this week and had some chest discomfort. It occurred again today while rushing to get her appointment. There is associated shortness of breath but no nausea or diaphoresis. She notes some improvement in her lower extremity edema since starting Lasix. She denies orthopnea or PND. She does note that she has been having to go to the bathroom frequently.  Amanda Alexander also reports leg numbness and pain with walking. This has been ongoing for years.  Amanda Alexander  walks a lot at work but does not get any formal exercise. 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    Past Medical History:  Diagnosis Date  . Diabetes mellitus without complication ([place])   . GERD (gastroesophageal reflux disease) 05/23/2014  . Hypertension   . Lower extremity edema 07/15/2016  . OSA on CPAP     Past Surgical History:  Procedure Laterality Date  . TONSILLECTOMY Bilateral      Current  Outpatient Prescriptions  Medication Sig Dispense Refill  . ACCU-CHEK FASTCLIX LANCETS MISC 1 Container by Does not apply route 2 (two) times daily. 102 each 0  . aspirin EC 81 MG tablet Take 81 mg by mouth daily.    . Blood Glucose Monitoring Suppl (ACCU-CHEK NANO SMARTVIEW) W/DEVICE KIT 1 Device by Does not apply route once. 1 kit 0  . furosemide (LASIX) 40 MG tablet Take 1 tablet (40 mg total) by mouth daily. 30 tablet 5  . glipiZIDE (GLUCOTROL) 10 MG tablet Take 1 tablet (10 mg total) by mouth 2 (two) times daily before a meal. 60 tablet 1  . glucose blood (ACCU-CHEK SMARTVIEW) test strip 1 each by Other route 2 (two) times daily at 8 am and 10 pm. Use to check blood sugar twice daily at 8am and 10pm. diag code E11.65. noninsulin dependent 100 each 12  . glyBURIDE (DIABETA) 5 MG tablet Take 10 mg by mouth 2 (two) times daily.  0  . lisinopril (PRINIVIL,ZESTRIL) 40 MG tablet Take 1 tablet (40 mg total) by mouth daily. 30 tablet 5  . metFORMIN (GLUCOPHAGE) 1000 MG tablet Take 1 tablet by mouth  twice a day with meals 180 tablet 6  . pantoprazole (PROTONIX) 40 MG tablet Take 1 tablet (40 mg total) by mouth daily. 30 tablet 6  . TOUJEO SOLOSTAR 300 UNIT/ML SOPN Inject 38 Units into the skin daily.  3  . traMADol (ULTRAM) 50 MG tablet Take 50 mg by mouth every 8 (  eight) hours as needed.  1   No current facility-administered medications for this visit.     Allergies:   Patient has no known allergies.    Social History:  The patient  reports that she has never smoked. She has never used smokeless tobacco. She reports that she does not drink alcohol or use drugs.   Family History:  The patient's family history includes Arthritis in her sister; Breast cancer in her sister; COPD in her sister; Cancer in her sister; Diabetes in her brother and mother; Heart disease in her mother; Hyperlipidemia in her brother; Lung cancer in her father; Other in her brother and father; Sleep apnea in her son.     ROS:  Please see the history of present illness.   Otherwise, review of systems are positive for none.   All other systems are reviewed and negative.    PHYSICAL EXAM: VS:  BP 118/64 (BP Location: Right Arm, Patient Position: Sitting, Cuff Size: Large)   Pulse 66   Ht 5' (1.524 m)   Wt 106.1 kg (234 lb)   SpO2 96%   BMI 45.70 kg/m  , BMI Body mass index is 45.7 kg/m. GENERAL:  Well appearing HEENT:  Pupils equal round and reactive, fundi not visualized, oral mucosa unremarkable NECK:  No JVD. Waveform within normal limits, carotid upstroke brisk and symmetric, no bruits, no thyromegaly LYMPHATICS:  No cervical adenopathy LUNGS:  Clear to auscultation bilaterally HEART:  RRR.  PMI not displaced or sustained,S1 and S2 within normal limits, no S3, no S4, no clicks, no rubs, no murmurs ABD:  Flat, positive bowel sounds normal in frequency in pitch, no bruits, no rebound, no guarding, no midline pulsatile mass, no hepatomegaly, no splenomegaly EXT:  2 plus pulses throughout, trace edema, no cyanosis no clubbing SKIN:  No rashes no nodules NEURO:  Cranial nerves II through XII grossly intact, motor grossly intact throughout PSYCH:  Cognitively intact, oriented to person place and time   EKG:  EKG is ordered today. The ekg ordered today demonstrates sinus rhythm.  Rate 67 bpm    Recent Labs: No results found for requested labs within last 8760 hours.   04/11/16: Sodium 140, potassium 4.1, BUN 14, creatinine 0.7 AST 11, ALT 12 Total cholesterol 146, triglycerides 132, HDL 49, LDL 71 Hemoglobin A1c 7.3%  Lipid Panel    Component Value Date/Time   CHOL 157 04/13/2014 0420   TRIG 140 04/13/2014 0420   HDL 47 04/13/2014 0420   CHOLHDL 3.3 04/13/2014 0420   VLDL 28 04/13/2014 0420   LDLCALC 82 04/13/2014 0420      Wt Readings from Last 3 Encounters:  08/12/16 106.1 kg (234 lb)  07/15/16 105.2 kg (232 lb)  01/26/15 102.3 kg (225 lb 9 oz)      ASSESSMENT AND PLAN:  #  Chest pain: She Continues to have chest pain. We will obtain an ETT to evaluate for ischemia.  Continue aspirin.   # Shortness of breath: # LE Edema: # Hypertension: Symtpoms have Improved slightly since starting Lasix. We will check a basic metabolic panel today. Continue Lasix and lisinopril.  # Claudication: We will obtain ABIs.   Current medicines are reviewed at length with the patient today.  The patient does not have concerns regarding medicines.  The following changes have been made:  None  Labs/ tests ordered today include:   Orders Placed This Encounter  Procedures  . Basic metabolic panel  . Exercise Tolerance Test  Disposition:   FU with Syriah Delisi C. Oval Linsey, MD, [facility] in 1 month   This note was written with the assistance of speech recognition software.  Please excuse any transcriptional errors.  Signed, Shirelle Tootle C. Oval Linsey, MD, [facility]  08/12/2016 1:34 PM    [place]

## 2016-08-12 NOTE — Telephone Encounter (Signed)
Reviewed with patient at office visit today

## 2016-08-19 ENCOUNTER — Ambulatory Visit: Payer: BLUE CROSS/BLUE SHIELD | Admitting: Cardiovascular Disease

## 2016-08-31 ENCOUNTER — Other Ambulatory Visit: Payer: Self-pay | Admitting: Cardiovascular Disease

## 2016-08-31 DIAGNOSIS — I739 Peripheral vascular disease, unspecified: Secondary | ICD-10-CM

## 2016-09-06 ENCOUNTER — Other Ambulatory Visit: Payer: Self-pay | Admitting: Cardiovascular Disease

## 2016-09-06 ENCOUNTER — Telehealth: Payer: Self-pay | Admitting: Cardiovascular Disease

## 2016-09-06 DIAGNOSIS — I739 Peripheral vascular disease, unspecified: Secondary | ICD-10-CM

## 2016-09-06 NOTE — Telephone Encounter (Signed)
New message  Pt states she received a call. Please call back to discuss

## 2016-09-06 NOTE — Telephone Encounter (Signed)
Please advise if this patient was called about upcoming stress test. I do not see any other documentation.

## 2016-09-07 ENCOUNTER — Telehealth (HOSPITAL_COMMUNITY): Payer: Self-pay

## 2016-09-07 NOTE — Telephone Encounter (Signed)
Encounter complete. 

## 2016-09-09 ENCOUNTER — Telehealth: Payer: Self-pay | Admitting: *Deleted

## 2016-09-09 ENCOUNTER — Inpatient Hospital Stay (HOSPITAL_COMMUNITY): Admission: RE | Admit: 2016-09-09 | Payer: BLUE CROSS/BLUE SHIELD | Source: Ambulatory Visit

## 2016-09-09 ENCOUNTER — Ambulatory Visit (HOSPITAL_COMMUNITY)
Admission: RE | Admit: 2016-09-09 | Discharge: 2016-09-09 | Disposition: A | Discharge status: Home / Self Care | Payer: BLUE CROSS/BLUE SHIELD | Source: Ambulatory Visit | Attending: Internal Medicine | Admitting: Internal Medicine

## 2016-09-09 DIAGNOSIS — R079 Chest pain, unspecified: Secondary | ICD-10-CM | POA: Insufficient documentation

## 2016-09-09 LAB — EXERCISE TOLERANCE TEST
CSEPEDS: 43 s
CSEPHR: 93 %
CSEPPHR: 151 {beats}/min
Estimated workload: 7 METS
Exercise duration (min): 5 min
MPHR: 162 {beats}/min
RPE: 18
Rest HR: 61 {beats}/min

## 2016-09-09 NOTE — Telephone Encounter (Signed)
Left message to call back  

## 2016-09-09 NOTE — Telephone Encounter (Signed)
-----   Message from Chilton Si, MD sent at 09/09/2016  4:16 PM EDT ----- Normal stress tests

## 2016-09-21 NOTE — Telephone Encounter (Signed)
Left message to call back  

## 2016-09-23 ENCOUNTER — Encounter (HOSPITAL_COMMUNITY): Payer: BLUE CROSS/BLUE SHIELD

## 2016-09-26 ENCOUNTER — Encounter: Payer: Self-pay | Admitting: Cardiovascular Disease

## 2016-09-26 ENCOUNTER — Ambulatory Visit (INDEPENDENT_AMBULATORY_CARE_PROVIDER_SITE_OTHER): Payer: BLUE CROSS/BLUE SHIELD | Admitting: Cardiovascular Disease

## 2016-09-26 VITALS — BP 112/60 | HR 72 | Ht 60.0 in | Wt 235.0 lb

## 2016-09-26 DIAGNOSIS — R0602 Shortness of breath: Secondary | ICD-10-CM

## 2016-09-26 DIAGNOSIS — R600 Localized edema: Secondary | ICD-10-CM | POA: Diagnosis not present

## 2016-09-26 DIAGNOSIS — R079 Chest pain, unspecified: Secondary | ICD-10-CM

## 2016-09-26 DIAGNOSIS — I10 Essential (primary) hypertension: Secondary | ICD-10-CM | POA: Diagnosis not present

## 2016-09-26 DIAGNOSIS — R0789 Other chest pain: Secondary | ICD-10-CM | POA: Diagnosis not present

## 2016-09-26 DIAGNOSIS — K219 Gastro-esophageal reflux disease without esophagitis: Secondary | ICD-10-CM | POA: Diagnosis not present

## 2016-09-26 MED ORDER — PANTOPRAZOLE SODIUM 40 MG PO TBEC: 40.0000 mg | DELAYED_RELEASE_TABLET | Freq: Every day | ORAL | 6 refills | Status: DC

## 2016-09-26 NOTE — Patient Instructions (Signed)
Medication Instructions:  ?Your physician recommends that you continue on your current medications as directed. Please refer to the Current Medication list given to you today.  ? ?Labwork: ?NONE ? ?Testing/Procedures: ?NONE ? ?Follow-Up: ?AS NEEDED  ? ?  ?

## 2016-09-26 NOTE — Telephone Encounter (Signed)
Reviewed at office visit today.

## 2016-09-26 NOTE — Progress Notes (Signed)
Cardiology Office Note   Date:  09/26/2016   ID:  KANDRA GRAVEN, DOB [birth year], MRN [id number]  PCP:  Maximino Greenland, MD  Cardiologist:   Skeet Latch, MD   Chief Complaint  Patient presents with  . Follow-up      History of Present Illness: Amanda Alexander is a 56 y.o. female with diabetes and hypertension who presents for follow up. Amanda Alexander was first seen 07/15/16 with atypical chest pain that occurred after lifting her grandson. It was clearly non-exertional and no further ischemia evaluation was indicated. At that appointment her blood pressure was poorly-controlled.  She also reported shortness of breath and had lower extremity edema. An echocardiogram was obtained 08/01/16 that revealed LVEF 65-70% with grade 1 diastolic dysfunction. There was mild mitral regurgitation and mild tricuspid regurgitation.  Hydrochlorothiazide was switched to Lasix with improvement in her edema.  At her last appointment she continued to complain of chest pain so she was referred for an ETT 09/09/16 that was negative for ischemia.  She achieved 7 METs on a Bruce Protocol.  She also reported leg pain and was referred for ABIs, but this has not yet been performed.  Amanda Alexander has been doing well.  She continues to have chest pressure.  She notes that it is worse after eating peppermint and certain foods. She has been taking OTC Tums but ran out of her pantoprazole.  She denies exertional chest pain.  She also denies orthopnea or PND but does have mild lower extremity edema that improves with Ted hose.     Past Medical History:  Diagnosis Date  . Diabetes mellitus without complication ([place])   . GERD (gastroesophageal reflux disease) 05/23/2014  . Hypertension   . Lower extremity edema 07/15/2016  . OSA on CPAP     Past Surgical History:  Procedure Laterality Date  . TONSILLECTOMY Bilateral      Current Outpatient Prescriptions  Medication Sig Dispense Refill  . ACCU-CHEK FASTCLIX LANCETS  MISC 1 Container by Does not apply route 2 (two) times daily. 102 each 0  . aspirin EC 81 MG tablet Take 81 mg by mouth daily.    . Blood Glucose Monitoring Suppl (ACCU-CHEK NANO SMARTVIEW) W/DEVICE KIT 1 Device by Does not apply route once. 1 kit 0  . furosemide (LASIX) 40 MG tablet Take 1 tablet (40 mg total) by mouth daily. 30 tablet 5  . glipiZIDE (GLUCOTROL) 10 MG tablet Take 1 tablet (10 mg total) by mouth 2 (two) times daily before a meal. 60 tablet 1  . glucose blood (ACCU-CHEK SMARTVIEW) test strip 1 each by Other route 2 (two) times daily at 8 am and 10 pm. Use to check blood sugar twice daily at 8am and 10pm. diag code E11.65. noninsulin dependent 100 each 12  . glyBURIDE (DIABETA) 5 MG tablet Take 10 mg by mouth 2 (two) times daily.  0  . lisinopril (PRINIVIL,ZESTRIL) 40 MG tablet Take 1 tablet (40 mg total) by mouth daily. 30 tablet 5  . metFORMIN (GLUCOPHAGE) 1000 MG tablet Take 1 tablet by mouth  twice a day with meals 180 tablet 6  . pantoprazole (PROTONIX) 40 MG tablet Take 1 tablet (40 mg total) by mouth daily. 30 tablet 6  . TOUJEO SOLOSTAR 300 UNIT/ML SOPN Inject 45 Units into the skin daily.   3  . traMADol (ULTRAM) 50 MG tablet Take 50 mg by mouth every 8 (eight) hours as needed.  1   No current facility-administered  medications for this visit.     Allergies:   Patient has no known allergies.    Social History:  The patient  reports that she has never smoked. She has never used smokeless tobacco. She reports that she does not drink alcohol or use drugs.   Family History:  The patient's family history includes Arthritis in her sister; Breast cancer in her sister; COPD in her sister; Cancer in her sister; Diabetes in her brother and mother; Heart disease in her mother; Hyperlipidemia in her brother; Lung cancer in her father; Other in her brother and father; Sleep apnea in her son.    ROS:  Please see the history of present illness.   Otherwise, review of systems are  positive for none.   All other systems are reviewed and negative.    PHYSICAL EXAM: VS:  BP 112/60   Pulse 72   Ht 5' (1.524 m)   Wt 106.6 kg (235 lb)   BMI 45.90 kg/m  , BMI Body mass index is 45.9 kg/m. GENERAL:  Well appearing HEENT:  Pupils equal round and reactive, fundi not visualized, oral mucosa unremarkable NECK:  No JVD. Waveform within normal limits, carotid upstroke brisk and symmetric, no bruits LYMPHATICS:  No cervical adenopathy LUNGS:  Clear to auscultation bilaterally HEART:  RRR.  PMI not displaced or sustained,S1 and S2 within normal limits, no S3, no S4, no clicks, no rubs, no murmurs ABD:  Flat, positive bowel sounds normal in frequency in pitch, no bruits, no rebound, no guarding, no midline pulsatile mass, no hepatomegaly, no splenomegaly EXT:  2 plus pulses throughout, No edema, no cyanosis no clubbing SKIN:  No rashes no nodules NEURO:  Cranial nerves II through XII grossly intact, motor grossly intact throughout PSYCH:  Cognitively intact, oriented to person place and time   EKG:  EKG is ordered today. The ekg ordered today demonstrates sinus rhythm.  Rate 67 bpm   ETT 09/09/16:  Blood pressure demonstrated a normal response to exercise.  There was no ST segment deviation noted during stress.   Normal exercise treadmill stress test. Mildly decreased functional capacity. Normal BP response to exertion.    Recent Labs: 08/12/2016: BUN 17; Creat 0.72; Potassium 4.5; Sodium 140   04/11/16: Sodium 140, potassium 4.1, BUN 14, creatinine 0.7 AST 11, ALT 12 Total cholesterol 146, triglycerides 132, HDL 49, LDL 71 Hemoglobin A1c 7.3%  Lipid Panel    Component Value Date/Time   CHOL 157 04/13/2014 0420   TRIG 140 04/13/2014 0420   HDL 47 04/13/2014 0420   CHOLHDL 3.3 04/13/2014 0420   VLDL 28 04/13/2014 0420   LDLCALC 82 04/13/2014 0420      Wt Readings from Last 3 Encounters:  09/26/16 106.6 kg (235 lb)  08/12/16 106.1 kg (234 lb)  07/15/16  105.2 kg (232 lb)      ASSESSMENT AND PLAN:  # Chest pain: ETT was negative for ischemia.  I suspect this is related to GERD.  Refill pantoprazole.  We discussed avoidance of triggers, not eating late, elevating the head of her bed and weight loss.  # LE Edema: Symtpoms have Improved slightly since starting Lasix. Continue Ted hose.  Only grade 1 diastolic dysfunction on echo.  # Shortness of breath: Mostly due to morbid obesity and deconditioning.  Encouraged Amanda Alexander to start exercising at least 30-40 minutes most days of the week.  # Hypertension:  Blood pressure is well controlled. Continue lisinopril and furosemide.  # Claudication: ABIs pending  #  GERD: Protonix 40 mg daily.  Current medicines are reviewed at length with the patient today.  The patient does not have concerns regarding medicines.  The following changes have been made:  None  Labs/ tests ordered today include:   No orders of the defined types were placed in this encounter.    Disposition:   FU with Amanda Alexander C. Oval Linsey, MD, [facility] as needed.     This note was written with the assistance of speech recognition software.  Please excuse any transcriptional errors.  Signed, Janeliz Prestwood C. Oval Linsey, MD, [facility]  09/26/2016 10:32 AM    [place]

## 2016-09-30 ENCOUNTER — Ambulatory Visit: Payer: BLUE CROSS/BLUE SHIELD | Admitting: Cardiovascular Disease

## 2016-10-13 DIAGNOSIS — E119 Type 2 diabetes mellitus without complications: Secondary | ICD-10-CM | POA: Diagnosis not present

## 2016-12-16 DIAGNOSIS — R635 Abnormal weight gain: Secondary | ICD-10-CM | POA: Diagnosis not present

## 2016-12-16 DIAGNOSIS — N951 Menopausal and female climacteric states: Secondary | ICD-10-CM | POA: Diagnosis not present

## 2016-12-19 DIAGNOSIS — G4730 Sleep apnea, unspecified: Secondary | ICD-10-CM | POA: Diagnosis not present

## 2016-12-19 DIAGNOSIS — I10 Essential (primary) hypertension: Secondary | ICD-10-CM | POA: Diagnosis not present

## 2016-12-19 DIAGNOSIS — E119 Type 2 diabetes mellitus without complications: Secondary | ICD-10-CM | POA: Diagnosis not present

## 2017-01-02 ENCOUNTER — Other Ambulatory Visit: Payer: Self-pay | Admitting: Cardiovascular Disease

## 2017-01-02 DIAGNOSIS — I10 Essential (primary) hypertension: Secondary | ICD-10-CM | POA: Diagnosis not present

## 2017-01-02 DIAGNOSIS — E119 Type 2 diabetes mellitus without complications: Secondary | ICD-10-CM | POA: Diagnosis not present

## 2017-01-02 DIAGNOSIS — E559 Vitamin D deficiency, unspecified: Secondary | ICD-10-CM | POA: Diagnosis not present

## 2017-01-02 NOTE — Telephone Encounter (Signed)
Please review for refill. Thanks!  

## 2017-01-02 NOTE — Telephone Encounter (Signed)
REFILL 

## 2017-01-13 DIAGNOSIS — E119 Type 2 diabetes mellitus without complications: Secondary | ICD-10-CM | POA: Diagnosis not present

## 2017-01-13 DIAGNOSIS — I10 Essential (primary) hypertension: Secondary | ICD-10-CM | POA: Diagnosis not present

## 2017-01-27 DIAGNOSIS — I10 Essential (primary) hypertension: Secondary | ICD-10-CM | POA: Diagnosis not present

## 2017-01-27 DIAGNOSIS — M545 Low back pain: Secondary | ICD-10-CM | POA: Diagnosis not present

## 2017-01-27 DIAGNOSIS — R609 Edema, unspecified: Secondary | ICD-10-CM | POA: Diagnosis not present

## 2017-01-27 DIAGNOSIS — E1165 Type 2 diabetes mellitus with hyperglycemia: Secondary | ICD-10-CM | POA: Diagnosis not present

## 2017-02-15 ENCOUNTER — Other Ambulatory Visit: Payer: Self-pay | Admitting: Cardiovascular Disease

## 2017-02-15 NOTE — Telephone Encounter (Signed)
REFILL 

## 2017-02-15 NOTE — Telephone Encounter (Signed)
Please review for refill, thanks ! 

## 2017-03-23 DIAGNOSIS — E1165 Type 2 diabetes mellitus with hyperglycemia: Secondary | ICD-10-CM | POA: Diagnosis not present

## 2017-03-23 DIAGNOSIS — Z23 Encounter for immunization: Secondary | ICD-10-CM | POA: Diagnosis not present

## 2017-03-23 DIAGNOSIS — E559 Vitamin D deficiency, unspecified: Secondary | ICD-10-CM | POA: Diagnosis not present

## 2017-03-23 DIAGNOSIS — Z0000 Encounter for general adult medical examination without abnormal findings: Secondary | ICD-10-CM | POA: Diagnosis not present

## 2017-03-23 DIAGNOSIS — I10 Essential (primary) hypertension: Secondary | ICD-10-CM | POA: Diagnosis not present

## 2017-03-23 DIAGNOSIS — E2839 Other primary ovarian failure: Secondary | ICD-10-CM | POA: Diagnosis not present

## 2017-03-31 ENCOUNTER — Other Ambulatory Visit: Payer: Self-pay | Admitting: Internal Medicine

## 2017-03-31 DIAGNOSIS — E2839 Other primary ovarian failure: Secondary | ICD-10-CM

## 2017-04-21 ENCOUNTER — Other Ambulatory Visit: Payer: Self-pay | Admitting: Pain Medicine

## 2017-04-21 DIAGNOSIS — G894 Chronic pain syndrome: Secondary | ICD-10-CM | POA: Diagnosis not present

## 2017-04-21 DIAGNOSIS — M545 Low back pain: Secondary | ICD-10-CM

## 2017-04-21 DIAGNOSIS — M79604 Pain in right leg: Secondary | ICD-10-CM | POA: Diagnosis not present

## 2017-04-21 DIAGNOSIS — Z79899 Other long term (current) drug therapy: Secondary | ICD-10-CM | POA: Diagnosis not present

## 2017-04-21 DIAGNOSIS — Z79891 Long term (current) use of opiate analgesic: Secondary | ICD-10-CM | POA: Diagnosis not present

## 2017-05-08 ENCOUNTER — Other Ambulatory Visit: Payer: Self-pay | Admitting: Internal Medicine

## 2017-05-08 DIAGNOSIS — Z1231 Encounter for screening mammogram for malignant neoplasm of breast: Secondary | ICD-10-CM

## 2017-05-09 DIAGNOSIS — M79609 Pain in unspecified limb: Secondary | ICD-10-CM | POA: Diagnosis not present

## 2017-05-09 DIAGNOSIS — M545 Low back pain: Secondary | ICD-10-CM | POA: Diagnosis not present

## 2017-05-19 DIAGNOSIS — Z79899 Other long term (current) drug therapy: Secondary | ICD-10-CM | POA: Diagnosis not present

## 2017-05-19 DIAGNOSIS — M545 Low back pain: Secondary | ICD-10-CM | POA: Diagnosis not present

## 2017-05-19 DIAGNOSIS — M79604 Pain in right leg: Secondary | ICD-10-CM | POA: Diagnosis not present

## 2017-05-19 DIAGNOSIS — Z79891 Long term (current) use of opiate analgesic: Secondary | ICD-10-CM | POA: Diagnosis not present

## 2017-05-19 DIAGNOSIS — G894 Chronic pain syndrome: Secondary | ICD-10-CM | POA: Diagnosis not present

## 2017-06-02 ENCOUNTER — Other Ambulatory Visit: Payer: BLUE CROSS/BLUE SHIELD

## 2017-06-05 ENCOUNTER — Other Ambulatory Visit: Payer: BLUE CROSS/BLUE SHIELD

## 2017-06-05 ENCOUNTER — Ambulatory Visit: Payer: BLUE CROSS/BLUE SHIELD

## 2017-06-30 ENCOUNTER — Inpatient Hospital Stay
Admission: RE | Admit: 2017-06-30 | Discharge: 2017-06-30 | Disposition: A | Discharge status: Home / Self Care | Payer: BLUE CROSS/BLUE SHIELD | Source: Ambulatory Visit | Attending: Internal Medicine | Admitting: Internal Medicine

## 2017-06-30 DIAGNOSIS — M545 Low back pain: Secondary | ICD-10-CM | POA: Diagnosis not present

## 2017-06-30 DIAGNOSIS — I10 Essential (primary) hypertension: Secondary | ICD-10-CM | POA: Diagnosis not present

## 2017-06-30 DIAGNOSIS — E1165 Type 2 diabetes mellitus with hyperglycemia: Secondary | ICD-10-CM | POA: Diagnosis not present

## 2017-07-03 ENCOUNTER — Ambulatory Visit
Admission: RE | Admit: 2017-07-03 | Discharge: 2017-07-03 | Disposition: A | Discharge status: Home / Self Care | Payer: BLUE CROSS/BLUE SHIELD | Source: Ambulatory Visit | Attending: Internal Medicine | Admitting: Internal Medicine

## 2017-07-03 DIAGNOSIS — Z1231 Encounter for screening mammogram for malignant neoplasm of breast: Secondary | ICD-10-CM

## 2017-09-14 ENCOUNTER — Other Ambulatory Visit: Payer: Self-pay | Admitting: Cardiovascular Disease

## 2017-09-14 DIAGNOSIS — K219 Gastro-esophageal reflux disease without esophagitis: Secondary | ICD-10-CM

## 2017-09-14 DIAGNOSIS — E1165 Type 2 diabetes mellitus with hyperglycemia: Secondary | ICD-10-CM | POA: Diagnosis not present

## 2017-09-14 DIAGNOSIS — L03213 Periorbital cellulitis: Secondary | ICD-10-CM | POA: Diagnosis not present

## 2017-09-14 DIAGNOSIS — I10 Essential (primary) hypertension: Secondary | ICD-10-CM | POA: Diagnosis not present

## 2017-09-14 DIAGNOSIS — Z79899 Other long term (current) drug therapy: Secondary | ICD-10-CM | POA: Diagnosis not present

## 2017-09-15 NOTE — Telephone Encounter (Signed)
Please review for refill, Thanks !  

## 2017-09-16 DIAGNOSIS — H00021 Hordeolum internum right upper eyelid: Secondary | ICD-10-CM | POA: Diagnosis not present

## 2017-12-18 DIAGNOSIS — G894 Chronic pain syndrome: Secondary | ICD-10-CM | POA: Diagnosis not present

## 2017-12-18 DIAGNOSIS — E1165 Type 2 diabetes mellitus with hyperglycemia: Secondary | ICD-10-CM | POA: Diagnosis not present

## 2017-12-18 DIAGNOSIS — I10 Essential (primary) hypertension: Secondary | ICD-10-CM | POA: Diagnosis not present

## 2017-12-18 DIAGNOSIS — Z79899 Other long term (current) drug therapy: Secondary | ICD-10-CM | POA: Diagnosis not present

## 2018-01-11 ENCOUNTER — Other Ambulatory Visit: Payer: Self-pay | Admitting: Cardiovascular Disease

## 2018-01-11 NOTE — Telephone Encounter (Signed)
Rx sent to pharmacy   

## 2018-02-13 ENCOUNTER — Other Ambulatory Visit: Payer: Self-pay | Admitting: Cardiovascular Disease

## 2018-02-13 DIAGNOSIS — K219 Gastro-esophageal reflux disease without esophagitis: Secondary | ICD-10-CM

## 2018-02-19 ENCOUNTER — Other Ambulatory Visit: Payer: Self-pay | Admitting: Cardiovascular Disease

## 2018-02-20 ENCOUNTER — Other Ambulatory Visit: Payer: Self-pay | Admitting: *Deleted

## 2018-03-19 ENCOUNTER — Other Ambulatory Visit: Payer: Self-pay | Admitting: Internal Medicine

## 2018-03-23 ENCOUNTER — Other Ambulatory Visit: Payer: Self-pay | Admitting: Cardiovascular Disease

## 2018-03-23 DIAGNOSIS — K219 Gastro-esophageal reflux disease without esophagitis: Secondary | ICD-10-CM

## 2018-04-11 ENCOUNTER — Other Ambulatory Visit: Payer: Self-pay | Admitting: Internal Medicine

## 2018-04-16 ENCOUNTER — Encounter: Payer: Self-pay | Admitting: Internal Medicine

## 2018-04-16 ENCOUNTER — Other Ambulatory Visit: Payer: Self-pay | Admitting: Internal Medicine

## 2018-04-16 ENCOUNTER — Other Ambulatory Visit: Payer: Self-pay | Admitting: Nurse Practitioner

## 2018-04-19 ENCOUNTER — Other Ambulatory Visit: Payer: Self-pay | Admitting: Nurse Practitioner

## 2018-04-21 ENCOUNTER — Other Ambulatory Visit: Payer: Self-pay | Admitting: Internal Medicine

## 2018-04-21 MED ORDER — LISINOPRIL 40 MG PO TABS: 40.0000 mg | ORAL_TABLET | Freq: Every day | ORAL | 1 refills | Status: DC

## 2018-04-21 MED ORDER — INSULIN GLARGINE (1 UNIT DIAL) 300 UNIT/ML ~~LOC~~ SOPN: 50.0000 [IU] | PEN_INJECTOR | Freq: Every day | SUBCUTANEOUS | 5 refills | Status: DC

## 2018-04-21 MED ORDER — FUROSEMIDE 40 MG PO TABS
40.0000 mg | ORAL_TABLET | Freq: Every day | ORAL | 1 refills | Status: DC
Start: 2018-04-21 — End: 2018-09-12

## 2018-04-30 ENCOUNTER — Encounter: Payer: Self-pay | Admitting: Nurse Practitioner

## 2018-05-01 ENCOUNTER — Encounter: Payer: Self-pay | Admitting: Internal Medicine

## 2018-05-07 ENCOUNTER — Other Ambulatory Visit (HOSPITAL_COMMUNITY)
Admission: RE | Admit: 2018-05-07 | Discharge: 2018-05-07 | Disposition: A | Discharge status: Home / Self Care | Payer: BLUE CROSS/BLUE SHIELD | Source: Ambulatory Visit | Attending: Internal Medicine | Admitting: Internal Medicine

## 2018-05-07 ENCOUNTER — Encounter: Payer: Self-pay | Admitting: Nurse Practitioner

## 2018-05-07 ENCOUNTER — Ambulatory Visit: Payer: BLUE CROSS/BLUE SHIELD | Admitting: Nurse Practitioner

## 2018-05-07 VITALS — BP 118/80 | HR 71 | Temp 98.1°F | Ht 60.0 in | Wt 219.8 lb

## 2018-05-07 DIAGNOSIS — M545 Low back pain: Secondary | ICD-10-CM

## 2018-05-07 DIAGNOSIS — E119 Type 2 diabetes mellitus without complications: Secondary | ICD-10-CM

## 2018-05-07 DIAGNOSIS — G8929 Other chronic pain: Secondary | ICD-10-CM | POA: Diagnosis not present

## 2018-05-07 DIAGNOSIS — Z794 Long term (current) use of insulin: Secondary | ICD-10-CM

## 2018-05-07 DIAGNOSIS — I10 Essential (primary) hypertension: Secondary | ICD-10-CM

## 2018-05-07 DIAGNOSIS — Z7982 Long term (current) use of aspirin: Secondary | ICD-10-CM

## 2018-05-07 DIAGNOSIS — H6123 Impacted cerumen, bilateral: Secondary | ICD-10-CM

## 2018-05-07 DIAGNOSIS — Z0000 Encounter for general adult medical examination without abnormal findings: Secondary | ICD-10-CM

## 2018-05-07 DIAGNOSIS — Z124 Encounter for screening for malignant neoplasm of cervix: Secondary | ICD-10-CM | POA: Diagnosis not present

## 2018-05-07 LAB — POCT URINALYSIS DIPSTICK
Bilirubin, UA: NEGATIVE
Blood, UA: NEGATIVE
Glucose, UA: NEGATIVE
Ketones, UA: NEGATIVE
LEUKOCYTES UA: NEGATIVE
Nitrite, UA: NEGATIVE
Protein, UA: NEGATIVE
SPEC GRAV UA: 1.015 (ref 1.010–1.025)
Urobilinogen, UA: 0.2 E.U./dL
pH, UA: 7 (ref 5.0–8.0)

## 2018-05-07 LAB — POCT UA - MICROALBUMIN
Albumin/Creatinine Ratio, Urine, POC: 30
Creatinine, POC: 200 mg/dL
MICROALBUMIN (UR) POC: 10 mg/L

## 2018-05-07 MED ORDER — TRAMADOL HCL 50 MG PO TABS
50.0000 mg | ORAL_TABLET | Freq: Four times a day (QID) | ORAL | 0 refills | Status: DC | PRN
Start: 2018-05-07 — End: 2018-06-29

## 2018-05-07 NOTE — Addendum Note (Signed)
Addended by: Arnette FeltsMOORE, Ian Cavey F on: 05/07/2018 05:01 PM   Modules accepted: Orders

## 2018-05-07 NOTE — Progress Notes (Addendum)
Subjective:     Patient ID: Amanda Alexander , female    DOB: Jun 23, 1960 , 56 y.o.   MRN: [id number]   Chief Complaint  Patient presents with  . Annual Exam   The patient states she uses post menopausal status for birth control. Last LMP was No LMP recorded. Patient is postmenopausal.. Negative for Dysmenorrhea and Negative for Menorrhagia Mammogram last done 07/03/2017.  Negative for: breast discharge, breast lump(s), breast pain and breast self exam.  Pertinent negatives include abnormal bleeding (hematology), anxiety, decreased libido, depression, difficulty falling sleep, dyspareunia, history of infertility, nocturia, sexual dysfunction, sleep disturbances, urinary incontinence, urinary urgency, vaginal discharge and vaginal itching. Diet regular.The patient states her exercise level is  walking - once per week.    . The patient's tobacco use is:  Social History   Tobacco Use  Smoking Status Never Smoker  Smokeless Tobacco Never Used  . She has been exposed to passive smoke. The patient's alcohol use is:  Social History   Substance and Sexual Activity  Alcohol Use No  . Alcohol/week: 0.0 standard drinks  . Additional information: Last pap before 2016, next one scheduled for today.   HPI  Diabetes  She presents for her follow-up diabetic visit. She has type 2 diabetes mellitus. No MedicAlert identification noted. Her disease course has been improving. Pertinent negatives for hypoglycemia include no dizziness or nervousness/anxiousness. Associated symptoms include polydipsia. Pertinent negatives for diabetes include no blurred vision, no chest pain, no fatigue, no polyphagia, no polyuria and no weakness. Pertinent negatives for hypoglycemia complications include no blackouts. Symptoms are stable. There are no diabetic complications. There are no known risk factors for coronary artery disease. When asked about current treatments, none were reported. She is compliant with treatment most  of the time. Eye exam is current.  Back Pain  This is a chronic problem. The current episode started more than 1 year ago. The problem occurs intermittently. The problem has been waxing and waning since onset. The pain is present in the lumbar spine. The quality of the pain is described as aching. The pain does not radiate. The pain is at a severity of 5/10. The pain is moderate. The pain is the same all the time. Pertinent negatives include no chest pain or weakness. Risk factors include obesity. She has tried NSAIDs for the symptoms. The treatment provided mild relief.     Past Medical History:  Diagnosis Date  . Diabetes mellitus without complication ([place])   . GERD (gastroesophageal reflux disease) 05/23/2014  . Hypertension   . Lower extremity edema 07/15/2016  . OSA on CPAP      Family History  Problem Relation Age of Onset  . Heart disease Mother   . Diabetes Mother   . Other Father        Polio  . Lung cancer Father   . Breast cancer Sister   . Arthritis Sister   . Cancer Sister   . COPD Sister   . Diabetes Brother   . Hyperlipidemia Brother   . Other Brother        polio and gun shot death  . Sleep apnea Son      Current Outpatient Medications:  .  aspirin EC 81 MG tablet, Take 81 mg by mouth daily., Disp: , Rfl:  .  furosemide (LASIX) 40 MG tablet, Take 1 tablet (40 mg total) by mouth daily., Disp: 90 tablet, Rfl: 1 .  glipiZIDE (GLUCOTROL) 10 MG tablet, Take 1 tablet (10 mg  total) by mouth 2 (two) times daily before a meal., Disp: 60 tablet, Rfl: 1 .  Insulin Glargine, 1 Unit Dial, (TOUJEO SOLOSTAR) 300 UNIT/ML SOPN, 50 Units by Subcutaneous Infusion route daily. Inject 50 units sq qhs, Disp: 5 pen, Rfl: 5 .  lisinopril (PRINIVIL,ZESTRIL) 40 MG tablet, Take 1 tablet (40 mg total) by mouth daily., Disp: 90 tablet, Rfl: 1 .  metFORMIN (GLUCOPHAGE) 1000 MG tablet, Take 1 tablet by mouth  twice a day with meals, Disp: 180 tablet, Rfl: 6 .  OZEMPIC, 0.25 OR 0.5 MG/DOSE, 2  MG/1.5ML SOPN, inject 0.5 MG subcutaneously Once weekly ON THE same DAY, in THE abdomen, THIGHS, OR upper TO THE ARM rotating injection sites., Disp: 1 pen, Rfl: 1 .  pantoprazole (PROTONIX) 40 MG tablet, Take 1 tablet (40 mg total) by mouth daily. NEED OV., Disp: 30 tablet, Rfl: 1 .  pravastatin (PRAVACHOL) 20 MG tablet, TAKE 1 TABLET BY MOUTH EVERY DAY, Disp: 90 tablet, Rfl: 0 .  ACCU-CHEK FASTCLIX LANCETS MISC, 1 Container by Does not apply route 2 (two) times daily. (Patient not taking: Reported on 05/07/2018), Disp: 102 each, Rfl: 0 .  Blood Glucose Monitoring Suppl (ACCU-CHEK NANO SMARTVIEW) W/DEVICE KIT, 1 Device by Does not apply route once. (Patient not taking: Reported on 05/07/2018), Disp: 1 kit, Rfl: 0 .  glucose blood (ACCU-CHEK SMARTVIEW) test strip, 1 each by Other route 2 (two) times daily at 8 am and 10 pm. Use to check blood sugar twice daily at 8am and 10pm. diag code E11.65. noninsulin dependent (Patient not taking: Reported on 05/07/2018), Disp: 100 each, Rfl: 12   No Known Allergies   Review of Systems  Constitutional: Negative.  Negative for fatigue.  HENT: Negative.   Eyes: Negative.  Negative for blurred vision.  Respiratory: Negative.   Cardiovascular: Positive for palpitations. Negative for chest pain and leg swelling.  Gastrointestinal: Negative.   Endocrine: Positive for polydipsia. Negative for polyphagia and polyuria.  Genitourinary: Negative.   Musculoskeletal: Positive for back pain.  Skin: Negative.   Allergic/Immunologic: Negative.   Neurological: Negative.  Negative for dizziness and weakness.  Hematological: Negative.   Psychiatric/Behavioral: Negative.  The patient is not nervous/anxious.      Today's Vitals   05/07/18 1145  BP: 118/80  Pulse: 71  Temp: 98.1 F (36.7 C)  TempSrc: Oral  SpO2: 90%  Weight: 219 lb 12.8 oz (99.7 kg)  Height: 5' (1.524 m)  PainSc: 0-No pain   Body mass index is 42.93 kg/m.   Objective:  Physical Exam   Constitutional: She is oriented to person, place, and time. Vital signs are normal. She appears well-developed and well-nourished.  HENT:  Head: Normocephalic and atraumatic.  Right Ear: Hearing, external ear and ear canal normal.  Left Ear: Hearing, external ear and ear canal normal.  Nose: Nose normal.  Mouth/Throat: Oropharynx is clear and moist.  Bilateral cerumen impaction, unable to visualize TMs  Eyes: Pupils are equal, round, and reactive to light. Conjunctivae, EOM and lids are normal.  Fundoscopic exam:      The right eye shows no papilledema.       The left eye shows no papilledema.  Neck: Normal range of motion and full passive range of motion without pain. Neck supple. Carotid bruit is not present. No thyroid mass present.  Cardiovascular: Normal rate, regular rhythm, normal heart sounds, intact distal pulses and normal pulses.  No murmur heard. Pulmonary/Chest: Effort normal and breath sounds normal.  Abdominal: Soft. Bowel sounds are  normal.  Genitourinary: Vagina normal and uterus normal. Pelvic exam was performed with patient prone. There is no tenderness on the right labia. There is no tenderness on the left labia. Cervix exhibits no motion tenderness, no discharge and no friability. Right adnexum displays no mass and no tenderness. Left adnexum displays no mass and no tenderness. No vaginal discharge found.  Musculoskeletal: Normal range of motion.  Neurological: She is alert and oriented to person, place, and time. She has normal strength. No cranial nerve deficit or sensory deficit.  Skin: Skin is warm and dry. Capillary refill takes less than 2 seconds.  Psychiatric: She has a normal mood and affect. Her behavior is normal. Judgment and thought content normal.        Assessment And Plan:      1. Encounter for health maintenance examination . Behavior modifications discussed and diet history reviewed.   . Pt will continue to exercise regularly and modify diet with  low GI, plant based foods and decrease intake of processed foods.  . Recommend intake of daily multivitamin, Vitamin D, and calcium.  . Recommend mammogram and colonoscopy for preventive screenings, as well as recommend immunizations that include influenza, TDAP, and Shingles  2. Encounter for Papanicolaou smear of cervix  No abnormal findings on PAP  3. Essential hypertension  Chronic, controlled  Continue with current medications - POCT Urinalysis Dipstick (81002) - POCT UA - Microalbumin - EKG 12-Lead  4. Type 2 diabetes mellitus without complication, with long-term current use of insulin (HCC)  Chronic, fair control  Continue with current medications  5. Chronic bilateral low back pain without sciatica  Intermittent low back pain  Will provide limited amount of Tramadol  6. Bilateral impacted cerumen  Advised to use 1/2 peroxide and 1/2 water to cleanse ears  Minette Brine, FNP

## 2018-05-07 NOTE — Addendum Note (Signed)
Addended by: Arnette FeltsMOORE, Lauran Romanski F on: 05/07/2018 04:54 PM   Modules accepted: Orders

## 2018-05-07 NOTE — Patient Instructions (Signed)

## 2018-05-08 LAB — CBC
HEMATOCRIT: 35 % (ref 34.0–46.6)
HEMOGLOBIN: 11.4 g/dL (ref 11.1–15.9)
MCH: 28.3 pg (ref 26.6–33.0)
MCHC: 32.6 g/dL (ref 31.5–35.7)
MCV: 87 fL (ref 79–97)
Platelets: 347 10*3/uL (ref 150–450)
RBC: 4.03 x10E6/uL (ref 3.77–5.28)
RDW: 13.7 % (ref 12.3–15.4)
WBC: 5.3 10*3/uL (ref 3.4–10.8)

## 2018-05-08 LAB — CMP14 + ANION GAP
ALBUMIN: 4.5 g/dL (ref 3.5–5.5)
ALT: 11 IU/L (ref 0–32)
ANION GAP: 12 mmol/L (ref 10.0–18.0)
AST: 12 IU/L (ref 0–40)
Albumin/Globulin Ratio: 1.6 (ref 1.2–2.2)
Alkaline Phosphatase: 87 IU/L (ref 39–117)
BUN / CREAT RATIO: 23 (ref 9–23)
BUN: 19 mg/dL (ref 6–24)
Bilirubin Total: 0.3 mg/dL (ref 0.0–1.2)
CALCIUM: 9.4 mg/dL (ref 8.7–10.2)
CO2: 30 mmol/L — ABNORMAL HIGH (ref 20–29)
CREATININE: 0.83 mg/dL (ref 0.57–1.00)
Chloride: 99 mmol/L (ref 96–106)
GFR calc Af Amer: 89 mL/min/{1.73_m2} (ref >59)
GFR, EST NON AFRICAN AMERICAN: 77 mL/min/{1.73_m2} (ref >59)
GLOBULIN, TOTAL: 2.8 g/dL (ref 1.5–4.5)
Glucose: 91 mg/dL (ref 65–99)
Potassium: 4.3 mmol/L (ref 3.5–5.2)
SODIUM: 141 mmol/L (ref 134–144)
TOTAL PROTEIN: 7.3 g/dL (ref 6.0–8.5)

## 2018-05-08 LAB — LIPID PANEL
CHOL/HDL RATIO: 3.2 ratio (ref 0.0–4.4)
Cholesterol, Total: 137 mg/dL (ref 100–199)
HDL: 43 mg/dL (ref >39)
LDL CALC: 69 mg/dL (ref 0–99)
TRIGLYCERIDES: 124 mg/dL (ref 0–149)
VLDL Cholesterol Cal: 25 mg/dL (ref 5–40)

## 2018-05-08 LAB — HEMOGLOBIN A1C
Est. average glucose Bld gHb Est-mCnc: 143 mg/dL
Hgb A1c MFr Bld: 6.6 % — ABNORMAL HIGH (ref 4.8–5.6)

## 2018-05-09 ENCOUNTER — Other Ambulatory Visit: Payer: Self-pay | Admitting: Internal Medicine

## 2018-05-11 LAB — CYTOLOGY - PAP: Diagnosis: NEGATIVE

## 2018-05-15 ENCOUNTER — Telehealth: Payer: Self-pay

## 2018-05-15 NOTE — Telephone Encounter (Signed)
Left the pt a message that a  glucometer has been placed up front for her to pickup.

## 2018-05-18 ENCOUNTER — Other Ambulatory Visit: Payer: Self-pay

## 2018-05-18 DIAGNOSIS — K219 Gastro-esophageal reflux disease without esophagitis: Secondary | ICD-10-CM

## 2018-05-18 MED ORDER — PANTOPRAZOLE SODIUM 40 MG PO TBEC: 40.0000 mg | DELAYED_RELEASE_TABLET | Freq: Every day | ORAL | 0 refills | Status: DC

## 2018-05-19 ENCOUNTER — Other Ambulatory Visit: Payer: Self-pay | Admitting: Nurse Practitioner

## 2018-06-12 ENCOUNTER — Other Ambulatory Visit: Payer: Self-pay | Admitting: Internal Medicine

## 2018-06-21 DIAGNOSIS — H25811 Combined forms of age-related cataract, right eye: Secondary | ICD-10-CM | POA: Diagnosis not present

## 2018-06-29 ENCOUNTER — Encounter: Payer: Self-pay | Admitting: Nurse Practitioner

## 2018-06-29 ENCOUNTER — Ambulatory Visit: Payer: BLUE CROSS/BLUE SHIELD | Admitting: Nurse Practitioner

## 2018-06-29 ENCOUNTER — Other Ambulatory Visit: Payer: Self-pay

## 2018-06-29 VITALS — BP 118/68 | HR 95 | Temp 98.4°F | Ht 60.6 in | Wt 217.0 lb

## 2018-06-29 DIAGNOSIS — M791 Myalgia, unspecified site: Secondary | ICD-10-CM | POA: Diagnosis not present

## 2018-06-29 DIAGNOSIS — Z20828 Contact with and (suspected) exposure to other viral communicable diseases: Secondary | ICD-10-CM

## 2018-06-29 DIAGNOSIS — Z23 Encounter for immunization: Secondary | ICD-10-CM | POA: Diagnosis not present

## 2018-06-29 DIAGNOSIS — G8929 Other chronic pain: Secondary | ICD-10-CM

## 2018-06-29 DIAGNOSIS — J069 Acute upper respiratory infection, unspecified: Secondary | ICD-10-CM | POA: Diagnosis not present

## 2018-06-29 DIAGNOSIS — M545 Low back pain: Secondary | ICD-10-CM

## 2018-06-29 MED ORDER — TRAMADOL HCL 50 MG PO TABS: 50.0000 mg | ORAL_TABLET | Freq: Four times a day (QID) | ORAL | 0 refills | Status: DC | PRN

## 2018-06-29 MED ORDER — OSELTAMIVIR PHOSPHATE 75 MG PO CAPS: 75.0000 mg | ORAL_CAPSULE | Freq: Two times a day (BID) | ORAL | 0 refills | Status: AC

## 2018-06-29 NOTE — Progress Notes (Signed)
Subjective:     Patient ID: Amanda Alexander , female    DOB: 07/02/1960 , 58 y.o.   MRN: [id number]   Chief Complaint  Patient presents with  . Influenza    started yesterday , runny nose, body aches chills eyes watering    HPI  Influenza  This is a new problem. The current episode started yesterday. The problem occurs constantly. The problem has been unchanged. Associated symptoms include chills and myalgias. Pertinent negatives include no abdominal pain, congestion, fever, headaches, nausea, numbness or sore throat. Nothing aggravates the symptoms. Treatments tried: theraflu nighttime. The treatment provided no relief.     Past Medical History:  Diagnosis Date  . Diabetes mellitus without complication ([facility])   . GERD (gastroesophageal reflux disease) 05/23/2014  . Hypertension   . Lower extremity edema 07/15/2016  . OSA on CPAP      Family History  Problem Relation Age of Onset  . Heart disease Mother   . Diabetes Mother   . Other Father        Polio  . Lung cancer Father   . Breast cancer Sister   . Arthritis Sister   . Cancer Sister   . COPD Sister   . Diabetes Brother   . Hyperlipidemia Brother   . Other Brother        polio and gun shot death  . Sleep apnea Son      Current Outpatient Medications:  .  ACCU-CHEK FASTCLIX LANCETS MISC, 1 Container by Does not apply route 2 (two) times daily. (Patient not taking: Reported on 05/07/2018), Disp: 102 each, Rfl: 0 .  aspirin EC 81 MG tablet, Take 81 mg by mouth daily., Disp: , Rfl:  .  furosemide (LASIX) 40 MG tablet, Take 1 tablet (40 mg total) by mouth daily., Disp: 90 tablet, Rfl: 1 .  glipiZIDE (GLUCOTROL) 10 MG tablet, Take 1 tablet (10 mg total) by mouth 2 (two) times daily before a meal. (Patient not taking: Reported on 06/29/2018), Disp: 60 tablet, Rfl: 1 .  glucose blood (ACCU-CHEK GUIDE) test strip, 1 each by Other route as needed for other. Use as instructed, Disp: , Rfl:  .  glucose blood (ACCU-CHEK  SMARTVIEW) test strip, 1 each by Other route 2 (two) times daily at 8 am and 10 pm. Use to check blood sugar twice daily at 8am and 10pm. diag code E11.65. noninsulin dependent (Patient not taking: Reported on 05/07/2018), Disp: 100 each, Rfl: 12 .  Insulin Glargine, 1 Unit Dial, (TOUJEO SOLOSTAR) 300 UNIT/ML SOPN, 50 Units by Subcutaneous Infusion route daily. Inject 50 units sq qhs, Disp: 5 pen, Rfl: 5 .  lisinopril (PRINIVIL,ZESTRIL) 40 MG tablet, Take 1 tablet (40 mg total) by mouth daily., Disp: 90 tablet, Rfl: 1 .  metFORMIN (GLUCOPHAGE) 1000 MG tablet, TAKE ONE TABLET BY MOUTH TWICE DAILY WITH breakfast AND dinner, Disp: 180 tablet, Rfl: 1 .  OZEMPIC, 0.25 OR 0.5 MG/DOSE, 2 MG/1.5ML SOPN, inject 0.5 MG subcutaneously Once weekly ON THE same DAY, in THE abdomen, THIGHS, OR upper TO THE ARM rotating injection sites., Disp: 1 pen, Rfl: 1 .  pantoprazole (PROTONIX) 40 MG tablet, Take 1 tablet (40 mg total) by mouth daily. NEED OV., Disp: 15 tablet, Rfl: 0 .  pravastatin (PRAVACHOL) 20 MG tablet, TAKE 1 TABLET BY MOUTH EVERY DAY, Disp: 90 tablet, Rfl: 0 .  SURE COMFORT PEN NEEDLES 32G X 6 MM MISC, USE TO inject EVERY DAY, Disp: 100 each, Rfl: 2 .  traMADol (  ULTRAM) 50 MG tablet, Take 1 tablet (50 mg total) by mouth every 6 (six) hours as needed., Disp: 20 tablet, Rfl: 0   No Known Allergies   Review of Systems  Constitutional: Positive for chills. Negative for fever.  HENT: Negative for congestion, sinus pressure, sinus pain and sore throat.   Eyes: Negative.   Respiratory: Negative.   Gastrointestinal: Negative for abdominal pain and nausea.  Musculoskeletal: Positive for myalgias.  Neurological: Positive for dizziness. Negative for numbness and headaches.     Today's Vitals   06/29/18 1110  BP: 118/68  Pulse: 95  Temp: 98.4 F (36.9 C)  TempSrc: Oral  SpO2: 98%  Weight: 217 lb (98.4 kg)  Height: 5' 0.6" (1.539 m)   Body mass index is 41.54 kg/m.   Objective:  Physical  Exam Constitutional:      Appearance: Normal appearance.  HENT:     Head: Normocephalic and atraumatic.     Right Ear: Tympanic membrane, ear canal and external ear normal. There is no impacted cerumen.     Left Ear: Tympanic membrane, ear canal and external ear normal. There is no impacted cerumen.     Nose: Congestion present. No rhinorrhea.     Mouth/Throat:     Mouth: Mucous membranes are moist.  Eyes:     Extraocular Movements: Extraocular movements intact.     Conjunctiva/sclera: Conjunctivae normal.     Pupils: Pupils are equal, round, and reactive to light.  Cardiovascular:     Rate and Rhythm: Normal rate and regular rhythm.     Pulses: Normal pulses.     Heart sounds: Normal heart sounds. No murmur.  Pulmonary:     Effort: Pulmonary effort is normal.     Breath sounds: Normal breath sounds.  Neurological:     General: No focal deficit present.     Mental Status: She is alert.  Psychiatric:        Mood and Affect: Mood normal.         Assessment And Plan:     1. Viral upper respiratory tract infection  Influenza A/B is negative however she has been exposed to flu, myalgias and chills  Symptom management and take HBP [place] - oseltamivir (TAMIFLU) 75 MG capsule; Take 1 capsule (75 mg total) by mouth 2 (two) times daily for 5 days.  Dispense: 10 capsule; Refill: 0  2. Myalgia  Likely related to her viral symptoms  May take Tylenol or ibuprofen as needed - oseltamivir (TAMIFLU) 75 MG capsule; Take 1 capsule (75 mg total) by mouth 2 (two) times daily for 5 days.  Dispense: 10 capsule; Refill: 0    Arnette Felts, FNP

## 2018-06-29 NOTE — Patient Instructions (Signed)

## 2018-07-16 ENCOUNTER — Other Ambulatory Visit: Payer: Self-pay | Admitting: Cardiovascular Disease

## 2018-07-16 DIAGNOSIS — K219 Gastro-esophageal reflux disease without esophagitis: Secondary | ICD-10-CM

## 2018-07-24 ENCOUNTER — Other Ambulatory Visit: Payer: Self-pay | Admitting: Nurse Practitioner

## 2018-08-07 ENCOUNTER — Ambulatory Visit: Payer: BLUE CROSS/BLUE SHIELD | Admitting: Internal Medicine

## 2018-08-09 ENCOUNTER — Other Ambulatory Visit: Payer: Self-pay | Admitting: Internal Medicine

## 2018-09-12 ENCOUNTER — Other Ambulatory Visit: Payer: Self-pay

## 2018-09-12 DIAGNOSIS — K219 Gastro-esophageal reflux disease without esophagitis: Secondary | ICD-10-CM

## 2018-09-12 MED ORDER — SEMAGLUTIDE(0.25 OR 0.5MG/DOS) 2 MG/1.5ML ~~LOC~~ SOPN: 0.5000 mg | PEN_INJECTOR | SUBCUTANEOUS | 1 refills | Status: DC

## 2018-09-12 MED ORDER — METFORMIN HCL 1000 MG PO TABS: ORAL_TABLET | ORAL | 1 refills | Status: DC

## 2018-09-12 MED ORDER — LISINOPRIL 40 MG PO TABS: 40.0000 mg | ORAL_TABLET | Freq: Every day | ORAL | 1 refills | Status: DC

## 2018-09-12 MED ORDER — INSULIN PEN NEEDLE 32G X 6 MM MISC: 2 refills | Status: DC

## 2018-09-12 MED ORDER — PRAVASTATIN SODIUM 20 MG PO TABS: ORAL_TABLET | ORAL | 1 refills | Status: DC

## 2018-09-12 MED ORDER — FUROSEMIDE 40 MG PO TABS: 40.0000 mg | ORAL_TABLET | Freq: Every day | ORAL | 1 refills | Status: DC

## 2018-09-12 MED ORDER — PANTOPRAZOLE SODIUM 40 MG PO TBEC: 40.0000 mg | DELAYED_RELEASE_TABLET | Freq: Every day | ORAL | 1 refills | Status: DC

## 2018-09-12 MED ORDER — INSULIN GLARGINE (1 UNIT DIAL) 300 UNIT/ML ~~LOC~~ SOPN: 50.0000 [IU] | PEN_INJECTOR | Freq: Every day | SUBCUTANEOUS | 1 refills | Status: DC

## 2018-09-17 ENCOUNTER — Encounter: Payer: Self-pay | Admitting: Nurse Practitioner

## 2018-09-17 ENCOUNTER — Ambulatory Visit: Payer: Self-pay | Admitting: Nurse Practitioner

## 2018-09-17 ENCOUNTER — Ambulatory Visit: Payer: BLUE CROSS/BLUE SHIELD | Admitting: Nurse Practitioner

## 2018-09-17 ENCOUNTER — Other Ambulatory Visit: Payer: Self-pay

## 2018-09-17 DIAGNOSIS — I10 Essential (primary) hypertension: Secondary | ICD-10-CM

## 2018-09-17 DIAGNOSIS — M5442 Lumbago with sciatica, left side: Secondary | ICD-10-CM | POA: Diagnosis not present

## 2018-09-17 DIAGNOSIS — Z7189 Other specified counseling: Secondary | ICD-10-CM

## 2018-09-17 DIAGNOSIS — Z794 Long term (current) use of insulin: Secondary | ICD-10-CM | POA: Diagnosis not present

## 2018-09-17 DIAGNOSIS — E119 Type 2 diabetes mellitus without complications: Secondary | ICD-10-CM | POA: Diagnosis not present

## 2018-09-17 MED ORDER — TRAMADOL HCL 50 MG PO TABS: 50.0000 mg | ORAL_TABLET | Freq: Four times a day (QID) | ORAL | 0 refills | Status: DC | PRN

## 2018-09-17 NOTE — Progress Notes (Addendum)
This visit type was conducted due to national recommendations for restrictions regarding the COVID-19 Pandemic (e.g. social distancing).  This format is felt to be most appropriate for this patient at this time.  All issues noted in this document were discussed and addressed.  No physical exam was performed (except for noted visual exam findings with Video Visits).  Please refer to the patient's chart (MyChart message for video visits and phone note for telephone visits) for the patient's consent to telehealth for [place].   Subjective:     Patient ID: Amanda Alexander , female    DOB: 01/18/1961 , 58 y.o.   MRN: [id number]  Virtual Visit via WebEx (Virtual) Note  I connected with Amanda Alexander at home on 09/17/18 at  3:30 PM EDT by telephone and verified that I am speaking with the correct person using two identifiers.   I discussed the limitations, risks, security and privacy concerns of performing an evaluation and management service by telephone and the availability of in person appointments. I also discussed with the patient that there may be a patient responsible charge related to this service. The patient expressed understanding and agreed to proceed.  Chief Complaint  Patient presents with  . Back Pain    History of Present Illness:   Back Pain  This is a recurrent problem. The current episode started in the past 7 days. The problem has been gradually worsening since onset. The pain is present in the lumbar spine. The quality of the pain is described as shooting. The pain does not radiate. The pain is at a severity of 6/10. Pertinent negatives include no abdominal pain, chest pain or numbness. Risk factors include obesity. Treatments tried: she had used Pennsaid with mild relief. The treatment provided mild relief.  Diabetes  She presents for her follow-up diabetic visit. She has type 2 diabetes mellitus. Her disease course has been stable. Pertinent negatives for hypoglycemia include  no confusion or dizziness. Pertinent negatives for diabetes include no blurred vision, no chest pain, no fatigue, no polydipsia, no polyphagia and no polyuria. There are no hypoglycemic complications. Symptoms are stable. Risk factors for coronary artery disease include obesity, hypertension and sedentary lifestyle. Current diabetic treatment includes oral agent (monotherapy). She is compliant with treatment all of the time. Her weight is stable. When asked about meal planning, she reported none. She rarely participates in exercise. An ACE inhibitor/angiotensin II receptor blocker is being taken. Eye exam is not current.     Past Medical History:  Diagnosis Date  . Diabetes mellitus without complication ([place])   . GERD (gastroesophageal reflux disease) 05/23/2014  . Hypertension   . Lower extremity edema 07/15/2016  . OSA on CPAP      Family History  Problem Relation Age of Onset  . Heart disease Mother   . Diabetes Mother   . Other Father        Polio  . Lung cancer Father   . Breast cancer Sister   . Arthritis Sister   . Cancer Sister   . COPD Sister   . Diabetes Brother   . Hyperlipidemia Brother   . Other Brother        polio and gun shot death  . Sleep apnea Son      Current Outpatient Medications:  .  ACCU-CHEK FASTCLIX LANCETS MISC, 1 Container by Does not apply route 2 (two) times daily. (Patient not taking: Reported on 05/07/2018), Disp: 102 each, Rfl: 0 .  aspirin EC 81  MG tablet, Take 81 mg by mouth daily., Disp: , Rfl:  .  furosemide (LASIX) 40 MG tablet, Take 1 tablet (40 mg total) by mouth daily., Disp: 90 tablet, Rfl: 1 .  glipiZIDE (GLUCOTROL) 10 MG tablet, Take 1 tablet (10 mg total) by mouth 2 (two) times daily before a meal. (Patient not taking: Reported on 06/29/2018), Disp: 60 tablet, Rfl: 1 .  glucose blood (ACCU-CHEK GUIDE) test strip, 1 each by Other route as needed for other. Use as instructed, Disp: , Rfl:  .  glucose blood (ACCU-CHEK SMARTVIEW) test strip,  1 each by Other route 2 (two) times daily at 8 am and 10 pm. Use to check blood sugar twice daily at 8am and 10pm. diag code E11.65. noninsulin dependent (Patient not taking: Reported on 05/07/2018), Disp: 100 each, Rfl: 12 .  Insulin Glargine, 1 Unit Dial, (TOUJEO SOLOSTAR) 300 UNIT/ML SOPN, 50 Units by Subcutaneous Infusion route daily for 30 days. Inject 50 units sq qhs, Disp: 15 pen, Rfl: 1 .  Insulin Pen Needle (SURE COMFORT PEN NEEDLES) 32G X 6 MM MISC, USE TO inject EVERY DAY, Disp: 100 each, Rfl: 2 .  lisinopril (PRINIVIL,ZESTRIL) 40 MG tablet, Take 1 tablet (40 mg total) by mouth daily., Disp: 90 tablet, Rfl: 1 .  metFORMIN (GLUCOPHAGE) 1000 MG tablet, TAKE ONE TABLET BY MOUTH TWICE DAILY WITH breakfast AND dinner, Disp: 180 tablet, Rfl: 1 .  pantoprazole (PROTONIX) 40 MG tablet, Take 1 tablet (40 mg total) by mouth daily. NEED OV., Disp: 90 tablet, Rfl: 1 .  pravastatin (PRAVACHOL) 20 MG tablet, take 1 tablet by oral route every day, Disp: 90 tablet, Rfl: 1 .  Semaglutide,0.25 or 0.5MG/DOS, (OZEMPIC, 0.25 OR 0.5 MG/DOSE,) 2 MG/1.5ML SOPN, Inject 0.5 mg into the skin once a week., Disp: 3 pen, Rfl: 1 .  traMADol (ULTRAM) 50 MG tablet, Take 1 tablet (50 mg total) by mouth every 6 (six) hours as needed., Disp: 30 tablet, Rfl: 0   No Known Allergies   Review of Systems  Constitutional: Negative for fatigue.  Eyes: Negative for blurred vision.  Respiratory: Negative for cough and wheezing.   Cardiovascular: Negative for chest pain, palpitations and leg swelling.  Gastrointestinal: Negative for abdominal pain.  Endocrine: Negative for polydipsia, polyphagia and polyuria.  Musculoskeletal: Positive for back pain.  Neurological: Negative for dizziness and numbness.  Psychiatric/Behavioral: Negative for confusion.     There were no vitals filed for this visit.  Observations/Objective: No acute distress noted while visualizing on WebEx       Assessment and Plan: 1. Essential  hypertension She is having a web visit, unable to check blood pressure at this time BMP ordered to check renal function.  The importance of regular exercise and dietary modification was stressed to the patient.  Stressed importance of losing ten percent of her body weight to help with B/P control.  The weight loss would help with decreasing cardiac and cancer risk as well.  - BMP8+eGFR; Future  2. Type 2 diabetes mellitus without complication, with long-term current use of insulin (HCC)  Chronic, fair control  Continue with current medications, will check HgbA1c.  Encouraged to limit intake of sugary foods and drinks  Encouraged to increase physical activity to 150 minutes per week - Hemoglobin A1c; Future - CBC no Diff; Future - BMP8+eGFR; Future  3. Acute left-sided low back pain with left-sided sciatica  Will treat with limited supply of tramadol encouraged to continue with Tylenol, pending her lab results will consider  a round of prednisone.   - traMADol (ULTRAM) 50 MG tablet; Take 1 tablet (50 mg total) by mouth every 6 (six) hours as needed.  Dispense: 30 tablet; Refill: 0   Follow Up Instructions:  Return call to office as needed.    I discussed the assessment and treatment plan with the patient. The patient was provided an opportunity to ask questions and all were answered. The patient agreed with the plan and demonstrated an understanding of the instructions.   The patient was advised to call back or seek an in-person evaluation if the symptoms worsen or if the condition fails to improve as anticipated.  COVID-19 Education: The signs and symptoms of COVID-19 were discussed with the patient and how to seek care for testing (follow up with PCP or arrange E-visit).  The importance of social distancing was discussed today.   Patient Risk:   After full review of this patients clinical status, I feel that they are at least moderate risk at this time.     Minette Brine,  FNP

## 2018-09-18 ENCOUNTER — Other Ambulatory Visit: Payer: BLUE CROSS/BLUE SHIELD

## 2018-09-18 DIAGNOSIS — E119 Type 2 diabetes mellitus without complications: Secondary | ICD-10-CM | POA: Diagnosis not present

## 2018-09-18 DIAGNOSIS — Z794 Long term (current) use of insulin: Secondary | ICD-10-CM

## 2018-09-18 DIAGNOSIS — I10 Essential (primary) hypertension: Secondary | ICD-10-CM | POA: Diagnosis not present

## 2018-09-18 LAB — CBC
Hematocrit: 35.4 % (ref 34.0–46.6)
Hemoglobin: 11.8 g/dL (ref 11.1–15.9)
MCH: 28.6 pg (ref 26.6–33.0)
MCHC: 33.3 g/dL (ref 31.5–35.7)
MCV: 86 fL (ref 79–97)
Platelets: 318 10*3/uL (ref 150–450)
RBC: 4.12 x10E6/uL (ref 3.77–5.28)
RDW: 14.2 % (ref 11.7–15.4)
WBC: 5.2 10*3/uL (ref 3.4–10.8)

## 2018-09-18 LAB — BMP8+EGFR
BUN/Creatinine Ratio: 18 (ref 12–28)
BUN: 14 mg/dL (ref 8–27)
CO2: 26 mmol/L (ref 20–29)
Calcium: 9.2 mg/dL (ref 8.7–10.3)
Chloride: 102 mmol/L (ref 96–106)
Creatinine, Ser: 0.79 mg/dL (ref 0.57–1.00)
GFR calc Af Amer: 94 mL/min/{1.73_m2} (ref >59)
GFR calc non Af Amer: 82 mL/min/{1.73_m2} (ref >59)
Glucose: 182 mg/dL — ABNORMAL HIGH (ref 65–99)
Potassium: 4.5 mmol/L (ref 3.5–5.2)
Sodium: 142 mmol/L (ref 134–144)

## 2018-09-18 LAB — HEMOGLOBIN A1C
Est. average glucose Bld gHb Est-mCnc: 146 mg/dL
Hgb A1c MFr Bld: 6.7 % — ABNORMAL HIGH (ref 4.8–5.6)

## 2018-09-19 ENCOUNTER — Ambulatory Visit: Payer: Self-pay | Admitting: Nurse Practitioner

## 2018-09-20 ENCOUNTER — Telehealth: Payer: Self-pay

## 2018-09-20 NOTE — Telephone Encounter (Signed)
-----   Message from Arnette Felts, FNP sent at 09/20/2018  9:17 AM EDT ----- Your kidney functions are normal.  Blood levels are normal.  HgbA1c is stable but slightly up to 6.7 from 6.6 are you taking your metformin twice a day as directed?  If so we may need to add on a medication to help bring this down.

## 2018-09-20 NOTE — Telephone Encounter (Signed)
1st attempt to give results 

## 2018-10-03 NOTE — Progress Notes (Signed)
Okay we will see how she does in the next 3 months before we make any changes.

## 2018-10-19 ENCOUNTER — Other Ambulatory Visit: Payer: BLUE CROSS/BLUE SHIELD

## 2018-11-01 ENCOUNTER — Telehealth: Payer: Self-pay

## 2018-11-01 NOTE — Telephone Encounter (Signed)
Left voicemail- called to schedule virtual appt for toe discoloration.  (she works in healthcare)

## 2018-11-06 ENCOUNTER — Encounter: Payer: BLUE CROSS/BLUE SHIELD | Admitting: Nurse Practitioner

## 2018-11-06 ENCOUNTER — Other Ambulatory Visit: Payer: Self-pay

## 2018-11-06 ENCOUNTER — Encounter: Payer: Self-pay | Admitting: Nurse Practitioner

## 2018-11-06 NOTE — Progress Notes (Signed)
Virtual Visit via Virtual   This visit type was conducted due to national recommendations for restrictions regarding the COVID-19 Pandemic (e.g. social distancing) in an effort to limit this patient's exposure and mitigate transmission in our community.  Patients identity confirmed using two different identifiers.  This format is felt to be most appropriate for this patient at this time.  All issues noted in this document were discussed and addressed.  No physical exam was performed (except for noted visual exam findings with Video Visits).    Date:  11/06/2018   ID:  Amanda GamblesCandace F Dedman, DOB 1960/10/30, MRN [id number]  Patient Location:    Provider location:   Office    Chief Complaint:    History of Present Illness:    Amanda Alexander is a 58 y.o. female who presents via video conferencing for a telehealth visit today.    The patient does not have symptoms concerning for COVID-19 infection (fever, chills, cough, or new shortness of breath).   Sent initial link at 407pm then again at 420pm after calling patient.  Called the patient myself at 430pm to see if having difficulty with link no answer, voicemail left.  Called again at 437pm no answer left voicemail.    Discolored toe - 58 year old female complaining of discolored toe for      . Denies any injury    Past Medical History:  Diagnosis Date  . Diabetes mellitus without complication (HCC)   . GERD (gastroesophageal reflux disease) 05/23/2014  . Hypertension   . Lower extremity edema 07/15/2016  . OSA on CPAP    Past Surgical History:  Procedure Laterality Date  . TONSILLECTOMY Bilateral      Current Meds  Medication Sig  . ACCU-CHEK FASTCLIX LANCETS MISC 1 Container by Does not apply route 2 (two) times daily.  Marland Kitchen. aspirin EC 81 MG tablet Take 81 mg by mouth daily.  . furosemide (LASIX) 40 MG tablet Take 1 tablet (40 mg total) by mouth daily.  Marland Kitchen. glipiZIDE (GLUCOTROL) 10 MG tablet Take 1 tablet (10 mg total) by mouth 2  (two) times daily before a meal.  . glucose blood (ACCU-CHEK GUIDE) test strip 1 each by Other route as needed for other. Use as instructed  . glucose blood (ACCU-CHEK SMARTVIEW) test strip 1 each by Other route 2 (two) times daily at 8 am and 10 pm. Use to check blood sugar twice daily at 8am and 10pm. diag code E11.65. noninsulin dependent  . Insulin Glargine, 1 Unit Dial, (TOUJEO SOLOSTAR) 300 UNIT/ML SOPN 50 Units by Subcutaneous Infusion route daily for 30 days. Inject 50 units sq qhs  . Insulin Pen Needle (SURE COMFORT PEN NEEDLES) 32G X 6 MM MISC USE TO inject EVERY DAY  . lisinopril (PRINIVIL,ZESTRIL) 40 MG tablet Take 1 tablet (40 mg total) by mouth daily.  . metFORMIN (GLUCOPHAGE) 1000 MG tablet TAKE ONE TABLET BY MOUTH TWICE DAILY WITH breakfast AND dinner  . pantoprazole (PROTONIX) 40 MG tablet Take 1 tablet (40 mg total) by mouth daily. NEED OV.  . pravastatin (PRAVACHOL) 20 MG tablet take 1 tablet by oral route every day  . Semaglutide,0.25 or 0.5MG /DOS, (OZEMPIC, 0.25 OR 0.5 MG/DOSE,) 2 MG/1.5ML SOPN Inject 0.5 mg into the skin once a week.  . traMADol (ULTRAM) 50 MG tablet Take 1 tablet (50 mg total) by mouth every 6 (six) hours as needed.     Allergies:   Patient has no known allergies.   Social History  Tobacco Use  . Smoking status: Never Smoker  . Smokeless tobacco: Never Used  Substance Use Topics  . Alcohol use: No    Alcohol/week: 0.0 standard drinks  . Drug use: No     Family Hx: The patient's family history includes Arthritis in her sister; Breast cancer in her sister; COPD in her sister; Cancer in her sister; Diabetes in her brother and mother; Heart disease in her mother; Hyperlipidemia in her brother; Lung cancer in her father; Other in her brother and father; Sleep apnea in her son.  ROS:   Please see the history of present illness.    ROS  All other systems reviewed and are negative.   Labs/Other Tests and Data Reviewed:    Recent Labs: 05/07/2018:  ALT 11 09/18/2018: BUN 14; Creatinine, Ser 0.79; Hemoglobin 11.8; Platelets 318; Potassium 4.5; Sodium 142   Recent Lipid Panel Lab Results  Component Value Date/Time   CHOL 137 05/07/2018 05:03 PM   TRIG 124 05/07/2018 05:03 PM   HDL 43 05/07/2018 05:03 PM   CHOLHDL 3.2 05/07/2018 05:03 PM   CHOLHDL 3.3 04/13/2014 04:20 AM   LDLCALC 69 05/07/2018 05:03 PM    Wt Readings from Last 3 Encounters:  06/29/18 217 lb (98.4 kg)  05/07/18 219 lb 12.8 oz (99.7 kg)  09/26/16 235 lb (106.6 kg)     Exam:    Vital Signs:  There were no vitals taken for this visit.    Physical Exam  ASSESSMENT & PLAN:      COVID-19 Education: The signs and symptoms of COVID-19 were discussed with the patient and how to seek care for testing (follow up with PCP or arrange E-visit).  The importance of social distancing was discussed today.  Patient Risk:   After full review of this patients clinical status, I feel that they are at least moderate risk at this time.  Time:   Today, I have spent  minutes/ seconds with the patient with telehealth technology discussing above diagnoses.     Medication Adjustments/Labs and Tests Ordered: Current medicines are reviewed at length with the patient today.  Concerns regarding medicines are outlined above.   Tests Ordered: No orders of the defined types were placed in this encounter.   Medication Changes: No orders of the defined types were placed in this encounter.   Disposition:  Follow up did not answer phone  Signed, Arnette Felts, FNP

## 2018-11-15 ENCOUNTER — Encounter: Payer: Self-pay | Admitting: Internal Medicine

## 2018-11-19 ENCOUNTER — Encounter: Payer: Self-pay | Admitting: Internal Medicine

## 2018-11-21 ENCOUNTER — Other Ambulatory Visit: Payer: Self-pay | Admitting: Nurse Practitioner

## 2018-12-06 ENCOUNTER — Other Ambulatory Visit: Payer: Self-pay | Admitting: Internal Medicine

## 2018-12-06 DIAGNOSIS — Z1231 Encounter for screening mammogram for malignant neoplasm of breast: Secondary | ICD-10-CM

## 2018-12-07 ENCOUNTER — Other Ambulatory Visit: Payer: Self-pay

## 2018-12-07 ENCOUNTER — Ambulatory Visit
Admission: RE | Admit: 2018-12-07 | Discharge: 2018-12-07 | Disposition: A | Discharge status: Home / Self Care | Payer: BC Managed Care – PPO | Source: Ambulatory Visit | Attending: Internal Medicine | Admitting: Internal Medicine

## 2018-12-07 DIAGNOSIS — Z1231 Encounter for screening mammogram for malignant neoplasm of breast: Secondary | ICD-10-CM | POA: Diagnosis not present

## 2018-12-10 ENCOUNTER — Other Ambulatory Visit: Payer: Self-pay | Admitting: Internal Medicine

## 2018-12-10 DIAGNOSIS — K219 Gastro-esophageal reflux disease without esophagitis: Secondary | ICD-10-CM

## 2018-12-14 ENCOUNTER — Other Ambulatory Visit: Payer: Self-pay | Admitting: Cardiovascular Disease

## 2018-12-31 ENCOUNTER — Ambulatory Visit: Payer: BC Managed Care – PPO | Admitting: Nurse Practitioner

## 2018-12-31 ENCOUNTER — Encounter: Payer: Self-pay | Admitting: Nurse Practitioner

## 2018-12-31 ENCOUNTER — Other Ambulatory Visit: Payer: Self-pay

## 2018-12-31 VITALS — BP 118/72 | HR 74 | Temp 98.2°F | Ht 61.0 in | Wt 226.4 lb

## 2018-12-31 DIAGNOSIS — G8929 Other chronic pain: Secondary | ICD-10-CM

## 2018-12-31 DIAGNOSIS — M5442 Lumbago with sciatica, left side: Secondary | ICD-10-CM | POA: Diagnosis not present

## 2018-12-31 DIAGNOSIS — E119 Type 2 diabetes mellitus without complications: Secondary | ICD-10-CM | POA: Diagnosis not present

## 2018-12-31 DIAGNOSIS — G4733 Obstructive sleep apnea (adult) (pediatric): Secondary | ICD-10-CM

## 2018-12-31 DIAGNOSIS — I10 Essential (primary) hypertension: Secondary | ICD-10-CM | POA: Diagnosis not present

## 2018-12-31 DIAGNOSIS — Z9989 Dependence on other enabling machines and devices: Secondary | ICD-10-CM

## 2018-12-31 LAB — POCT URINALYSIS DIPSTICK
Bilirubin, UA: NEGATIVE
Blood, UA: NEGATIVE
Glucose, UA: NEGATIVE
Ketones, UA: NEGATIVE
Leukocytes, UA: NEGATIVE
Nitrite, UA: NEGATIVE
Protein, UA: NEGATIVE
Spec Grav, UA: 1.025 (ref 1.010–1.025)
Urobilinogen, UA: 0.2 E.U./dL
pH, UA: 5.5 (ref 5.0–8.0)

## 2018-12-31 LAB — POCT UA - MICROALBUMIN
Creatinine, POC: 100 mg/dL
Microalbumin Ur, POC: 80 mg/L

## 2018-12-31 MED ORDER — DICLOFENAC SODIUM 1 % TD GEL: 2.0000 g | Freq: Four times a day (QID) | TRANSDERMAL | 2 refills | Status: AC

## 2018-12-31 MED ORDER — TRAMADOL HCL 50 MG PO TABS: 50.0000 mg | ORAL_TABLET | Freq: Four times a day (QID) | ORAL | 0 refills | Status: DC | PRN

## 2018-12-31 NOTE — Progress Notes (Signed)
Subjective:     Patient ID: Amanda Alexander , female    DOB: 1961/02/13 , 58 y.o.   MRN: [id number]   Chief Complaint  Patient presents with  . Diabetes    HPI  Wt Readings from Last 3 Encounters: 12/31/18 : 226 lb 6.4 oz (102.7 kg) 06/29/18 : 217 lb (98.4 kg) 05/07/18 : 219 lb 12.8 oz (99.7 kg)   Diabetes She presents for her follow-up diabetic visit. She has type 2 diabetes mellitus. Her disease course has been stable. Pertinent negatives for hypoglycemia include no confusion, dizziness or headaches. Pertinent negatives for diabetes include no blurred vision, no chest pain, no polydipsia, no polyphagia and no polyuria. There are no diabetic complications. Risk factors for coronary artery disease include sedentary lifestyle and obesity. Current diabetic treatment includes oral agent (dual therapy). She is compliant with treatment all of the time. She is following a generally unhealthy diet. When asked about meal planning, she reported none. She rarely participates in exercise. (Blood sugars 168 last time she took her readings. ) An ACE inhibitor/angiotensin II receptor blocker is being taken. She does not see a podiatrist.Eye exam is not current (appt on 14th of august for her cataracts).     Past Medical History:  Diagnosis Date  . Diabetes mellitus without complication ([place])   . GERD (gastroesophageal reflux disease) 05/23/2014  . Hypertension   . Lower extremity edema 07/15/2016  . OSA on CPAP      Family History  Problem Relation Age of Onset  . Heart disease Mother   . Diabetes Mother   . Other Father        Polio  . Lung cancer Father   . Breast cancer Sister   . Arthritis Sister   . Cancer Sister   . COPD Sister   . Diabetes Brother   . Hyperlipidemia Brother   . Other Brother        polio and gun shot death  . Sleep apnea Son      Current Outpatient Medications:  .  ACCU-CHEK FASTCLIX LANCETS MISC, 1 Container by Does not apply route 2 (two) times daily.,  Disp: 102 each, Rfl: 0 .  aspirin EC 81 MG tablet, Take 81 mg by mouth daily., Disp: , Rfl:  .  furosemide (LASIX) 40 MG tablet, Take 1 tablet (40 mg total) by mouth daily., Disp: 90 tablet, Rfl: 1 .  glucose blood (ACCU-CHEK GUIDE) test strip, 1 each by Other route as needed for other. Use as instructed, Disp: , Rfl:  .  glucose blood (ACCU-CHEK SMARTVIEW) test strip, 1 each by Other route 2 (two) times daily at 8 am and 10 pm. Use to check blood sugar twice daily at 8am and 10pm. diag code E11.65. noninsulin dependent, Disp: 100 each, Rfl: 12 .  Insulin Glargine, 1 Unit Dial, (TOUJEO SOLOSTAR) 300 UNIT/ML SOPN, 50 Units by Subcutaneous Infusion route daily for 30 days. Inject 50 units sq qhs, Disp: 15 pen, Rfl: 1 .  Insulin Pen Needle (SURE COMFORT PEN NEEDLES) 32G X 6 MM MISC, USE TO inject EVERY DAY, Disp: 100 each, Rfl: 2 .  lisinopril (PRINIVIL,ZESTRIL) 40 MG tablet, Take 1 tablet (40 mg total) by mouth daily., Disp: 90 tablet, Rfl: 1 .  metFORMIN (GLUCOPHAGE) 1000 MG tablet, TAKE ONE TABLET BY MOUTH TWICE DAILY WITH breakfast AND dinner, Disp: 180 tablet, Rfl: 1 .  pantoprazole (PROTONIX) 40 MG tablet, Take 1 tablet (40 mg total) by mouth daily. NEED OV., Disp: 90  tablet, Rfl: 1 .  pravastatin (PRAVACHOL) 20 MG tablet, take 1 tablet by oral route every day, Disp: 90 tablet, Rfl: 1 .  Semaglutide,0.25 or 0.5MG /DOS, (OZEMPIC, 0.25 OR 0.5 MG/DOSE,) 2 MG/1.5ML SOPN, Inject 0.5 mg into the skin once a week., Disp: 3 pen, Rfl: 1 .  traMADol (ULTRAM) 50 MG tablet, Take 1 tablet (50 mg total) by mouth every 6 (six) hours as needed., Disp: 30 tablet, Rfl: 0 .  glipiZIDE (GLUCOTROL) 10 MG tablet, Take 1 tablet (10 mg total) by mouth 2 (two) times daily before a meal. (Patient not taking: Reported on 12/31/2018), Disp: 60 tablet, Rfl: 1   No Known Allergies   Review of Systems  Constitutional: Negative.   HENT:       Left eye swelling in the morning  Eyes: Negative for blurred vision.  Respiratory:  Negative.   Cardiovascular: Negative for chest pain, palpitations and leg swelling.  Endocrine: Negative for polydipsia, polyphagia and polyuria.  Neurological: Negative for dizziness and headaches.  Psychiatric/Behavioral: Negative for confusion.     Today's Vitals   12/31/18 0910  BP: 118/72  Pulse: 74  Temp: 98.2 F (36.8 C)  TempSrc: Oral  Weight: 226 lb 6.4 oz (102.7 kg)  Height: 5\' 1"  (1.549 m)  PainSc: 0-No pain   Body mass index is 42.78 kg/m.   Objective:  Physical Exam Vitals signs reviewed.  Constitutional:      General: She is not in acute distress.    Appearance: Normal appearance. She is obese.  Eyes:     Extraocular Movements: Extraocular movements intact.     Conjunctiva/sclera: Conjunctivae normal.     Pupils: Pupils are equal, round, and reactive to light.     Comments: Left eye upper lid swelling  Cardiovascular:     Rate and Rhythm: Normal rate and regular rhythm.     Pulses: Normal pulses.     Heart sounds: Normal heart sounds. No murmur.  Pulmonary:     Effort: Pulmonary effort is normal.     Breath sounds: Normal breath sounds.  Skin:    General: Skin is warm and dry.     Capillary Refill: Capillary refill takes less than 2 seconds.  Neurological:     General: No focal deficit present.     Mental Status: She is alert and oriented to person, place, and time.         Assessment And Plan:     1. Type 2 diabetes mellitus without complication, unspecified whether long term insulin use (HCC)  Chronic, stable   Continue with current medications  Encouraged to limit intake of sugary foods and drinks  Encouraged to increase physical activity to 150 minutes per week as tolerated - POCT Urinalysis Dipstick (81002) - POCT UA - Microalbumin - diclofenac sodium (VOLTAREN) 1 % GEL; Apply 2 g topically 4 (four) times daily.  Dispense: 50 g; Refill: 2 - Hemoglobin A1c - CMP14 + Anion Gap  2. Chronic bilateral low back pain without  sciatica  Encouraged to use diclofenac gel for her pain prior to using Tramadol - diclofenac sodium (VOLTAREN) 1 % GEL; Apply 2 g topically 4 (four) times daily.  Dispense: 50 g; Refill: 2  3. Acute left-sided low back pain with left-sided sciatica  Reoccurring back pain feels she has flared after doing some lifting - traMADol (ULTRAM) 50 MG tablet; Take 1 tablet (50 mg total) by mouth every 6 (six) hours as needed.  Dispense: 30 tablet; Refill: 0  4. OSA  on CPAP  She has not been using her CPAP has been about 3 years since seeing a sleep provider  I will refer for sleep evaluation - Ambulatory referral to Neurology  5. Essential hypertension . B/P is under good control . CMP ordered to check renal function.  . The importance of regular exercise and dietary modification was stressed to the patient.  . Stressed importance of losing ten percent of her body weight to help with B/P control.  - CMP14 + Anion Gap     Amanda FeltsJanece Ran Tullis, FNP    THE PATIENT IS ENCOURAGED TO PRACTICE SOCIAL DISTANCING DUE TO THE COVID-19 PANDEMIC.

## 2019-01-01 LAB — CMP14 + ANION GAP
ALT: 20 IU/L (ref 0–32)
AST: 13 IU/L (ref 0–40)
Albumin/Globulin Ratio: 1.5 (ref 1.2–2.2)
Albumin: 4.2 g/dL (ref 3.8–4.9)
Alkaline Phosphatase: 94 IU/L (ref 39–117)
Anion Gap: 17 mmol/L (ref 10.0–18.0)
BUN/Creatinine Ratio: 20 (ref 12–28)
BUN: 18 mg/dL (ref 8–27)
Bilirubin Total: 0.4 mg/dL (ref 0.0–1.2)
CO2: 25 mmol/L (ref 20–29)
Calcium: 9.8 mg/dL (ref 8.7–10.3)
Chloride: 98 mmol/L (ref 96–106)
Creatinine, Ser: 0.89 mg/dL (ref 0.57–1.00)
GFR calc Af Amer: 81 mL/min/{1.73_m2} (ref >59)
GFR calc non Af Amer: 71 mL/min/{1.73_m2} (ref >59)
Globulin, Total: 2.8 g/dL (ref 1.5–4.5)
Glucose: 155 mg/dL — ABNORMAL HIGH (ref 65–99)
Potassium: 4.5 mmol/L (ref 3.5–5.2)
Sodium: 140 mmol/L (ref 134–144)
Total Protein: 7 g/dL (ref 6.0–8.5)

## 2019-01-01 LAB — HEMOGLOBIN A1C
Est. average glucose Bld gHb Est-mCnc: 166 mg/dL
Hgb A1c MFr Bld: 7.4 % — ABNORMAL HIGH (ref 4.8–5.6)

## 2019-01-11 ENCOUNTER — Telehealth: Payer: Self-pay

## 2019-01-11 NOTE — Telephone Encounter (Signed)
Left message for pt to call back. Pt needs to be informed that he PA for diclofenac gel was denied and she can buy Voltaren gel over the counter.

## 2019-02-19 ENCOUNTER — Other Ambulatory Visit: Payer: Self-pay | Admitting: Internal Medicine

## 2019-03-08 DIAGNOSIS — E119 Type 2 diabetes mellitus without complications: Secondary | ICD-10-CM | POA: Diagnosis not present

## 2019-03-08 DIAGNOSIS — H25811 Combined forms of age-related cataract, right eye: Secondary | ICD-10-CM | POA: Diagnosis not present

## 2019-03-08 DIAGNOSIS — Z01818 Encounter for other preprocedural examination: Secondary | ICD-10-CM | POA: Diagnosis not present

## 2019-03-18 ENCOUNTER — Other Ambulatory Visit: Payer: Self-pay | Admitting: Internal Medicine

## 2019-03-29 DIAGNOSIS — H2511 Age-related nuclear cataract, right eye: Secondary | ICD-10-CM | POA: Diagnosis not present

## 2019-03-29 DIAGNOSIS — H25811 Combined forms of age-related cataract, right eye: Secondary | ICD-10-CM | POA: Diagnosis not present

## 2019-04-09 ENCOUNTER — Telehealth: Payer: Self-pay

## 2019-04-09 NOTE — Telephone Encounter (Signed)
error 

## 2019-04-12 ENCOUNTER — Other Ambulatory Visit: Payer: Self-pay | Admitting: Internal Medicine

## 2019-04-12 DIAGNOSIS — H2512 Age-related nuclear cataract, left eye: Secondary | ICD-10-CM | POA: Diagnosis not present

## 2019-04-12 DIAGNOSIS — H25812 Combined forms of age-related cataract, left eye: Secondary | ICD-10-CM | POA: Diagnosis not present

## 2019-04-22 ENCOUNTER — Other Ambulatory Visit: Payer: Self-pay

## 2019-04-22 ENCOUNTER — Ambulatory Visit: Payer: BC Managed Care – PPO | Admitting: Neurology

## 2019-04-22 ENCOUNTER — Encounter: Payer: Self-pay | Admitting: Neurology

## 2019-04-22 VITALS — BP 137/83 | HR 64 | Temp 97.4°F | Ht 60.0 in | Wt 228.0 lb

## 2019-04-22 DIAGNOSIS — G4733 Obstructive sleep apnea (adult) (pediatric): Secondary | ICD-10-CM

## 2019-04-22 DIAGNOSIS — R351 Nocturia: Secondary | ICD-10-CM

## 2019-04-22 DIAGNOSIS — G4719 Other hypersomnia: Secondary | ICD-10-CM | POA: Diagnosis not present

## 2019-04-22 DIAGNOSIS — Z6841 Body Mass Index (BMI) 40.0 and over, adult: Secondary | ICD-10-CM

## 2019-04-22 NOTE — Patient Instructions (Signed)
Thank you for choosing [facility] for your sleep related care! It was nice to meet you today! I appreciate that you entrust me with your sleep related healthcare concerns. I hope, I was able to address at least some of your concerns today, and that I can help you feel reassured and also get better.    Here is what we discussed today and what we came up with as our plan for you:    Based on your symptoms and your exam I believe you may still be at risk for obstructive sleep apnea (aka OSA), and I think we should proceed with a sleep study to determine whether you do or do not have OSA and how severe it is. Even, if you have mild OSA, I may want you to consider treatment with CPAP, as treatment of even borderline or mild sleep apnea can result and improvement of symptoms such as sleep disruption, daytime sleepiness, nighttime bathroom breaks, restless leg symptoms, improvement of headache syndromes, even improved mood disorder.   Please remember, the long-term risks and ramifications of untreated moderate to severe obstructive sleep apnea are: increased Cardiovascular disease, including congestive heart failure, stroke, difficult to control hypertension, treatment resistant obesity, arrhythmias, especially irregular heartbeat commonly known as A. Fib. (atrial fibrillation); even type 2 diabetes has been linked to untreated OSA.   Sleep apnea can cause disruption of sleep and sleep deprivation in most cases, which, in turn, can cause recurrent headaches, problems with memory, mood, concentration, focus, and vigilance. Most people with untreated sleep apnea report excessive daytime sleepiness, which can affect their ability to drive. Please do not drive if you feel sleepy. Patients with sleep apnea developed difficulty initiating and maintaining sleep (aka insomnia).   Having sleep apnea may increase your risk for other sleep disorders, including involuntary behaviors sleep such as sleep  terrors, sleep talking, sleepwalking.    Having sleep apnea can also increase your risk for restless leg syndrome and leg movements at night.   Please note that untreated obstructive sleep apnea may carry additional perioperative morbidity. Patients with significant obstructive sleep apnea (typically, in the moderate to severe degree) should receive, if possible, perioperative PAP (positive airway pressure) therapy and the surgeons and particularly the anesthesiologists should be informed of the diagnosis and the severity of the sleep disordered breathing.   I will likely see you back after your sleep study to go over the test results and where to go from there. We will call you after your sleep study to advise about the results (most likely, you will hear from Kristen, my nurse) and to set up an appointment at the time, as necessary.    Our sleep lab administrative assistant will call you to schedule your sleep study and give you further instructions, regarding the check in process for the sleep study, arrival time, what to bring, when you can expect to leave after the study, etc., and to answer any other logistical questions you may have. If you don't hear back from her by about 2 weeks from now, please feel free to call her direct line at [phone] or you can call our general clinic number, or email us through My Chart.   

## 2019-04-22 NOTE — Progress Notes (Signed)
Subjective:    Patient ID: Amanda Alexander is a 58 y.o. female.  HPI     Amanda Foley, MD, PhD [facility] [address] [address] [place], [place] [address]  Dear Amanda Alexander,   I saw your patient, Amanda Alexander, upon your kind request in my sleep clinic today for initial consultation of her sleep disorder, in particular, re-evaluation of her prior diagnosis of OSA.  The patient is unaccompanied today.  As you know, Amanda Alexander is a 58 year old right-handed woman with an underlying medical history of diabetes, hypertension, lower extremity edema, reflux disease and obesity, who was previously diagnosed with obstructive sleep apnea and placed on CPAP therapy.  I reviewed your office note from 12/31/2018.  Prior sleep study results are not available for my review today.  She has an older CPAP machine but has not been using it for the past year or 2.  She has misplaced or lost her machine cord.  She was tested about 10 years ago and was able to use CPAP for about 8 or 9 years even.  She felt she benefited from it.  She would be willing to get retested and get new equipment.  She is worried about implications of untreated sleep apnea with respect damage to the heart.  She reports that her daughter and her son have been diagnosed with sleep apnea.  Her weight has been stable.  Her Epworth sleepiness score is 15 out of 24, fatigue severity score is 27 out of 63.  She works as a Lawyer at [employer].  She goes to bed late, she has trouble falling asleep.  Bedtime is around midnight, rise time between 530 and 7 depending on [name].  She denies recurrent morning headaches and has nocturia about once or twice per average night.  She has a TV in the bedroom but it is currently not working.  She had a tonsillectomy at age 66 secondary to recurrent tonsillitis.  They also trimmed her uvula at the time.  She drinks caffeine in the form of coffee, 2 cups/day on average, non-smoker, drinks  alcohol occasionally.  She had restless leg symptoms in the past, none recently.  She lives with her 65 year old daughter, they have no pets at the house.  Her Past Medical History Is Significant For: Past Medical History:  Diagnosis Date  . Diabetes mellitus without complication (HCC)   . GERD (gastroesophageal reflux disease) 05/23/2014  . Hypertension   . Lower extremity edema 07/15/2016  . OSA on CPAP     Her Past Surgical History Is Significant For: Past Surgical History:  Procedure Laterality Date  . TONSILLECTOMY Bilateral     Her Family History Is Significant For: Family History  Problem Relation Age of Onset  . Heart disease Mother   . Diabetes Mother   . Other Father        Polio  . Lung cancer Father   . Breast cancer Sister   . Arthritis Sister   . Cancer Sister   . COPD Sister   . Diabetes Brother   . Hyperlipidemia Brother   . Other Brother        polio and gun shot death  . Sleep apnea Son     Her Social History Is Significant For: Social History   Socioeconomic History  . Marital status: Widowed    Spouse name: Not on file  . Number of children: Not on file  . Years of education: Not on file  .  Highest education level: Not on file  Occupational History  . Not on file  Social Needs  . Financial resource strain: Not on file  . Food insecurity    Worry: Not on file    Inability: Not on file  . Transportation needs    Medical: Not on file    Non-medical: Not on file  Tobacco Use  . Smoking status: Never Smoker  . Smokeless tobacco: Never Used  Substance and Sexual Activity  . Alcohol use: No    Alcohol/week: 0.0 standard drinks  . Drug use: No  . Sexual activity: Never  Lifestyle  . Physical activity    Days per week: Not on file    Minutes per session: Not on file  . Stress: Not on file  Relationships  . Social Herbalist on phone: Not on file    Gets together: Not on file    Attends religious service: Not on file     Active member of club or organization: Not on file    Attends meetings of clubs or organizations: Not on file    Relationship status: Not on file  Other Topics Concern  . Not on file  Social History Narrative  . Not on file    Her Allergies Are:  No Known Allergies:   Her Current Medications Are:  Outpatient Encounter Medications as of 04/22/2019  Medication Sig  . ACCU-CHEK FASTCLIX LANCETS MISC 1 Container by Does not apply route 2 (two) times daily.  Marland Kitchen aspirin EC 81 MG tablet Take 81 mg by mouth daily.  . diclofenac sodium (VOLTAREN) 1 % GEL Apply 2 g topically 4 (four) times daily.  Marland Kitchen glucose blood (ACCU-CHEK GUIDE) test strip 1 each by Other route as needed for other. Use as instructed  . glucose blood (ACCU-CHEK SMARTVIEW) test strip 1 each by Other route 2 (two) times daily at 8 am and 10 pm. Use to check blood sugar twice daily at 8am and 10pm. diag code E11.65. noninsulin dependent  . Insulin Pen Needle (SURE COMFORT PEN NEEDLES) 32G X 6 MM MISC USE TO inject EVERY DAY  . metFORMIN (GLUCOPHAGE) 1000 MG tablet TAKE ONE TABLET BY MOUTH TWICE DAILY WITH breakfast AND dinner  . OZEMPIC, 0.25 OR 0.5 MG/DOSE, 2 MG/1.5ML SOPN Inject 0.5 mg into the skin once a week.  . pantoprazole (PROTONIX) 40 MG tablet Take 1 tablet (40 mg total) by mouth daily. NEED OV.  . pravastatin (PRAVACHOL) 20 MG tablet take 1 tablet by oral route every day  . TOUJEO SOLOSTAR 300 UNIT/ML SOPN 50 Units by Subcutaneous Infusion route daily for 30 days. Inject 50 units sq qhs  . traMADol (ULTRAM) 50 MG tablet Take 1 tablet (50 mg total) by mouth every 6 (six) hours as needed.  . furosemide (LASIX) 40 MG tablet Take 1 tablet (40 mg total) by mouth daily.  Marland Kitchen lisinopril (PRINIVIL,ZESTRIL) 40 MG tablet Take 1 tablet (40 mg total) by mouth daily.   No facility-administered encounter medications on file as of 04/22/2019.   :  Review of Systems:  Out of a complete 14 point review of systems, all are reviewed and  negative with the exception of these symptoms as listed below: Review of Systems  Neurological:       Pt presents today to discuss her sleep. Pt has had a sleep study and has an older cpap at home. She does not use it. She had surgery on her tonsils. She does endorse snoring.  Epworth Sleepiness Scale 0= would never doze 1= slight chance of dozing 2= moderate chance of dozing 3= high chance of dozing  Sitting and reading: 1 Watching TV: 2 Sitting inactive in a public place (ex. Theater or meeting): 2 As a passenger in a car for an hour without a break: 2 Lying down to rest in the afternoon: 3 Sitting and talking to someone: 2 Sitting quietly after lunch (no alcohol): 2 In a car, while stopped in traffic: 1 Total: 15     Objective:  Neurological Exam  Physical Exam Physical Examination:   Vitals:   04/22/19 1318  BP: 137/83  Pulse: 64  Temp: (!) 97.4 F (36.3 C)    General Examination: The patient is a very pleasant 58 y.o. female in no acute distress. She appears well-developed and well-nourished and well groomed.   HEENT: Normocephalic, atraumatic, pupils are equal, round and reactive to light, Extraocular tracking is well preserved, she wears corrective eyeglasses.  Face is symmetric with normal facial animation, hearing is grossly intact.  Airway examination reveals a moderately crowded airway secondary to smaller airway entry and wider uvula, uvula has been trimmed, tonsils are absent.  Neck circumference is 17 and three-quarter inches.  She has dentures on top and missing several teeth on the bottom, she used to have a partial for the bottom.  She has no significant overbite.  Tongue protrudes centrally and palate elevates symmetrically.  Chest: Clear to auscultation without wheezing, rhonchi or crackles noted.  Heart: S1+S2+0, regular and normal without murmurs, rubs or gallops noted.   Abdomen: Soft, non-tender and non-distended with normal bowel sounds appreciated  on auscultation.  Extremities: There is 1+ pitting edema in the distal lower extremities bilaterally, around the ankles bilaterally, left side a little worse than right.   Skin: Warm and dry without trophic changes noted.  Musculoskeletal: exam reveals no obvious joint deformities, tenderness or joint swelling or erythema.   Neurologically:  Mental status: The patient is awake, alert and oriented in all 4 spheres. Her immediate and remote memory, attention, language skills and fund of knowledge are appropriate. There is no evidence of aphasia, agnosia, apraxia or anomia. Speech is clear with normal prosody and enunciation. Thought process is linear. Mood is normal and affect is normal.  Cranial nerves II - XII are as described above under HEENT exam. In addition: shoulder shrug is normal with equal shoulder height noted. Motor exam: Normal bulk, strength and tone is noted. There is no drift, tremor or rebound. Romberg is negative. Reflexes are 1+ in the upper extremities, trace in the lower extremities. Fine motor skills and coordination: grossly intact.  Cerebellar testing: No dysmetria or intention tremor. There is no truncal or gait ataxia.  Sensory exam: intact to light touch in the upper and lower extremities.  Gait, station and balance: She stands easily. No veering to one side is noted. No leaning to one side is noted. Posture is age-appropriate and stance is narrow based. Gait shows normal stride length and normal pace. No problems turning are noted. Tandem walk is challenging but doable.   Assessment and Plan:  In summary, Amanda Alexander is a very pleasant 58 y.o.-year old female with an underlying medical history of diabetes, hypertension, lower extremity edema, reflux disease and obesity, who Presents for evaluation of her prior diagnosis of obstructive sleep apnea.  She used a CPAP machine up until a year or 2 ago.  She felt that she benefited from treatment.  Prior sleep  study results  or a compliance data are not available.  She would benefit from reevaluation with a sleep study testing. I had a long chat with the patient about my findings and the diagnosis of OSA, its prognosis and treatment options. We talked about medical treatments, surgical interventions and non-pharmacological approaches. I explained in particular the risks and ramifications of untreated moderate to severe OSA, especially with respect to developing cardiovascular disease down the Road, including congestive heart failure, difficult to treat hypertension, cardiac arrhythmias, or stroke. Even type 2 diabetes has, in part, been linked to untreated OSA. Symptoms of untreated OSA include daytime sleepiness, memory problems, mood irritability and mood disorder such as depression and anxiety, lack of energy, as well as recurrent headaches, especially morning headaches. We talked about trying to maintain a healthy lifestyle in general, as well as the importance of weight control. We also talked about the importance of good sleep hygiene. I recommended the following at this time: sleep study.   I explained the sleep test procedure to the patient and also outlined possible surgical and non-surgical treatment options of OSA, including the use of a custom-made dental device (which would require a referral to a specialist dentist or oral surgeon), upper airway surgical options, such as traditional UPPP or a novel less invasive surgical option in the form of Inspire hypoglossal nerve stimulation (which would involve a referral to an ENT surgeon). I also explained the CPAP treatment option to the patient, who indicated that she would be willing to try CPAP again, if the need arises. I explained the importance of being compliant with PAP treatment, not only for insurance purposes but primarily to improve Her symptoms, and for the patient's long term health benefit, including to reduce Her cardiovascular risks. I answered all her  questions today and the patient was in agreement. I plan to see her back after the sleep study is completed and encouraged her to call with any interim questions, concerns, problems or updates.   Thank you very much for allowing me to participate in the care of this nice patient. If I can be of any further assistance to you please do not hesitate to call me at [phone].  Sincerely,   Amanda FoleySaima Thressa Shiffer, MD, PhD

## 2019-04-26 DIAGNOSIS — H2513 Age-related nuclear cataract, bilateral: Secondary | ICD-10-CM | POA: Diagnosis not present

## 2019-04-26 LAB — HM DIABETES EYE EXAM

## 2019-05-11 ENCOUNTER — Encounter (HOSPITAL_COMMUNITY): Payer: Self-pay | Admitting: Emergency Medicine

## 2019-05-11 ENCOUNTER — Emergency Department (HOSPITAL_COMMUNITY)
Admission: EM | Admit: 2019-05-11 | Discharge: 2019-05-12 | Disposition: A | Discharge status: Home / Self Care | Payer: BC Managed Care – PPO | Attending: Emergency Medicine | Admitting: Emergency Medicine

## 2019-05-11 ENCOUNTER — Other Ambulatory Visit: Payer: Self-pay

## 2019-05-11 ENCOUNTER — Emergency Department (HOSPITAL_COMMUNITY): Payer: BC Managed Care – PPO

## 2019-05-11 DIAGNOSIS — R079 Chest pain, unspecified: Secondary | ICD-10-CM | POA: Diagnosis not present

## 2019-05-11 DIAGNOSIS — E119 Type 2 diabetes mellitus without complications: Secondary | ICD-10-CM | POA: Diagnosis not present

## 2019-05-11 DIAGNOSIS — Z7982 Long term (current) use of aspirin: Secondary | ICD-10-CM | POA: Diagnosis not present

## 2019-05-11 DIAGNOSIS — M549 Dorsalgia, unspecified: Secondary | ICD-10-CM | POA: Insufficient documentation

## 2019-05-11 DIAGNOSIS — R197 Diarrhea, unspecified: Secondary | ICD-10-CM | POA: Diagnosis not present

## 2019-05-11 DIAGNOSIS — R0602 Shortness of breath: Secondary | ICD-10-CM | POA: Diagnosis not present

## 2019-05-11 DIAGNOSIS — Z794 Long term (current) use of insulin: Secondary | ICD-10-CM | POA: Diagnosis not present

## 2019-05-11 DIAGNOSIS — Z79899 Other long term (current) drug therapy: Secondary | ICD-10-CM | POA: Insufficient documentation

## 2019-05-11 DIAGNOSIS — R072 Precordial pain: Secondary | ICD-10-CM | POA: Insufficient documentation

## 2019-05-11 DIAGNOSIS — I10 Essential (primary) hypertension: Secondary | ICD-10-CM | POA: Insufficient documentation

## 2019-05-11 LAB — TROPONIN I (HIGH SENSITIVITY)
Troponin I (High Sensitivity): 4 ng/L (ref <18)
Troponin I (High Sensitivity): 4 ng/L (ref <18)

## 2019-05-11 LAB — CBC
HCT: 35 % — ABNORMAL LOW (ref 36.0–46.0)
Hemoglobin: 11.5 g/dL — ABNORMAL LOW (ref 12.0–15.0)
MCH: 29.7 pg (ref 26.0–34.0)
MCHC: 32.9 g/dL (ref 30.0–36.0)
MCV: 90.4 fL (ref 80.0–100.0)
Platelets: 286 10*3/uL (ref 150–400)
RBC: 3.87 MIL/uL (ref 3.87–5.11)
RDW: 14.8 % (ref 11.5–15.5)
WBC: 6.4 10*3/uL (ref 4.0–10.5)
nRBC: 0 % (ref 0.0–0.2)

## 2019-05-11 LAB — BASIC METABOLIC PANEL
Anion gap: 12 (ref 5–15)
BUN: 16 mg/dL (ref 6–20)
CO2: 26 mmol/L (ref 22–32)
Calcium: 9.3 mg/dL (ref 8.9–10.3)
Chloride: 103 mmol/L (ref 98–111)
Creatinine, Ser: 0.88 mg/dL (ref 0.44–1.00)
GFR calc Af Amer: >60 mL/min (ref >60)
GFR calc non Af Amer: >60 mL/min (ref >60)
Glucose, Bld: 222 mg/dL — ABNORMAL HIGH (ref 70–99)
Potassium: 3.9 mmol/L (ref 3.5–5.1)
Sodium: 141 mmol/L (ref 135–145)

## 2019-05-11 MED ORDER — SODIUM CHLORIDE 0.9% FLUSH
3.0000 mL | Freq: Once | INTRAVENOUS | Status: AC
  Administered 2019-05-12: 3 mL via INTRAVENOUS

## 2019-05-11 NOTE — ED Triage Notes (Signed)
Pt c/o 6/10 mid cp, sob, dizziness and diarrhea for the past few days.

## 2019-05-12 MED ORDER — NITROGLYCERIN 0.4 MG SL SUBL: 0.4000 mg | SUBLINGUAL_TABLET | SUBLINGUAL | 0 refills | Status: AC | PRN

## 2019-05-12 MED ORDER — NITROGLYCERIN 0.4 MG SL SUBL
0.4000 mg | SUBLINGUAL_TABLET | SUBLINGUAL | Status: DC | PRN
  Administered 2019-05-12: 0.4 mg via SUBLINGUAL
  Filled 2019-05-12: qty 1

## 2019-05-12 MED ORDER — ASPIRIN 81 MG PO CHEW
324.0000 mg | CHEWABLE_TABLET | Freq: Once | ORAL | Status: AC
  Administered 2019-05-12: 324 mg via ORAL
  Filled 2019-05-12: qty 4

## 2019-05-12 NOTE — Discharge Instructions (Signed)

## 2019-05-12 NOTE — ED Notes (Signed)
Pt discharge instructions and prescriptions reviewed with the patient. Pt verbalized understanding of discharge instructions and prescriptions. Pt discharged.

## 2019-05-12 NOTE — ED Provider Notes (Signed)
[facility] EMERGENCY DEPARTMENT Provider Note   CSN: [id number] Arrival date & time: 05/11/19  2003     History   Chief Complaint Chief Complaint  Patient presents with  . Chest Pain    HPI Amanda Alexander is a 58 y.o. female.  HPI: A 58 year old patient with a history of treated diabetes and hypertension presents for evaluation of chest pain. Initial onset of pain was more than 6 hours ago. The patient's chest pain is described as heaviness/pressure/tightness, is not worse with exertion and is relieved by nitroglycerin. The patient's chest pain is middle- or left-sided, is not well-localized, is not sharp and does not radiate to the arms/jaw/neck. The patient does not complain of nausea and denies diaphoresis. The patient has no history of stroke, has no history of peripheral artery disease, has not smoked in the past 90 days, has no relevant family history of coronary artery disease (first degree relative at less than age 67), has no history of hypercholesterolemia and does not have an elevated BMI (>=30).   HPI Patient reports onset of chest pain over 24 hours ago.  Has been constant.  She also had some upper back discomfort.  No fevers or vomiting.  No significant shortness of breath or cough.  No focal weakness.  She has chronic edema in her lower extremities Her course is stable.  Patient also reports recent diarrhea, no vomiting Past Medical History:  Diagnosis Date  . Diabetes mellitus without complication ([place])   . GERD (gastroesophageal reflux disease) 05/23/2014  . Hypertension   . Lower extremity edema 07/15/2016  . OSA on CPAP     Patient Active Problem List   Diagnosis Date Noted  . Acute left-sided low back pain with left-sided sciatica 09/17/2018  . Lower extremity edema 07/15/2016  . GERD (gastroesophageal reflux disease) 05/23/2014  . Type 2 diabetes mellitus without complication ([place]) 12/04/7626  . HTN (hypertension) 04/26/2013  . OSA on  CPAP 04/26/2013  . Healthcare maintenance 04/26/2013    Past Surgical History:  Procedure Laterality Date  . TONSILLECTOMY Bilateral      OB History   No obstetric history on file.      Home Medications    Prior to Admission medications   Medication Sig Start Date End Date Taking? Authorizing Provider  aspirin EC 81 MG tablet Take 81 mg by mouth daily.   Yes [provider]  diclofenac sodium (VOLTAREN) 1 % GEL Apply 2 g topically 4 (four) times daily. 12/31/18  Yes Minette Brine, FNP  furosemide (LASIX) 40 MG tablet Take 1 tablet (40 mg total) by mouth daily. 09/12/18 05/11/28 Yes Glendale Chard, MD  lisinopril (PRINIVIL,ZESTRIL) 40 MG tablet Take 1 tablet (40 mg total) by mouth daily. 09/12/18 05/11/28 Yes Glendale Chard, MD  metFORMIN (GLUCOPHAGE) 1000 MG tablet TAKE ONE TABLET BY MOUTH TWICE DAILY WITH breakfast AND dinner Patient taking differently: Take 1,000 mg by mouth 2 (two) times daily with a meal.  11/22/18  Yes Minette Brine, FNP  OZEMPIC, 0.25 OR 0.5 MG/DOSE, 2 MG/1.5ML SOPN Inject 0.5 mg into the skin once a week. Patient taking differently: Take 0.5 mg by mouth every Friday.  03/18/19  Yes Minette Brine, FNP  pantoprazole (PROTONIX) 40 MG tablet Take 1 tablet (40 mg total) by mouth daily. NEED OV. 12/10/18  Yes Glendale Chard, MD  pravastatin (PRAVACHOL) 20 MG tablet take 1 tablet by oral route every day Patient taking differently: Take 20 mg by mouth every evening.  02/19/19  Yes Arnette FeltsMoore, Janece, FNP  TOUJEO SOLOSTAR 300 UNIT/ML SOPN 50 Units by Subcutaneous Infusion route daily for 30 days. Inject 50 units sq qhs Patient taking differently: Inject 50 Units into the skin at bedtime.  04/13/19  Yes Dorothyann PengSanders, Robyn, MD  traMADol (ULTRAM) 50 MG tablet Take 1 tablet (50 mg total) by mouth every 6 (six) hours as needed. Patient taking differently: Take 50 mg by mouth every 6 (six) hours as needed for moderate pain.  12/31/18 12/31/19 Yes Arnette FeltsMoore, Janece, FNP  ACCU-CHEK FASTCLIX  LANCETS MISC 1 Container by Does not apply route 2 (two) times daily. 12/06/13   Carolan ClinesSadek, Nora M, MD  glucose blood (ACCU-CHEK GUIDE) test strip 1 each by Other route as needed for other. Use as instructed    [provider]  glucose blood (ACCU-CHEK SMARTVIEW) test strip 1 each by Other route 2 (two) times daily at 8 am and 10 pm. Use to check blood sugar twice daily at 8am and 10pm. diag code E11.65. noninsulin dependent 07/01/14   Carolan ClinesSadek, Nora M, MD  Insulin Pen Needle (SURE COMFORT PEN NEEDLES) 32G X 6 MM MISC USE TO inject EVERY DAY 09/12/18   Dorothyann PengSanders, Robyn, MD  nitroGLYCERIN (NITROSTAT) 0.4 MG SL tablet Place 1 tablet (0.4 mg total) under the tongue every 5 (five) minutes as needed for chest pain. 05/12/19   Zadie RhineWickline, Konstantinos Cordoba, MD    Family History Family History  Problem Relation Age of Onset  . Heart disease Mother   . Diabetes Mother   . Other Father        Polio  . Lung cancer Father   . Breast cancer Sister   . Arthritis Sister   . Cancer Sister   . COPD Sister   . Diabetes Brother   . Hyperlipidemia Brother   . Other Brother        polio and gun shot death  . Sleep apnea Son     Social History Social History   Tobacco Use  . Smoking status: Never Smoker  . Smokeless tobacco: Never Used  Substance Use Topics  . Alcohol use: No    Alcohol/week: 0.0 standard drinks  . Drug use: No     Allergies   Patient has no known allergies.   Review of Systems Review of Systems  Constitutional: Negative for fever.  Respiratory: Negative for shortness of breath.   Cardiovascular: Positive for chest pain.  Gastrointestinal: Positive for diarrhea. Negative for vomiting.  All other systems reviewed and are negative.    Physical Exam Updated Vital Signs BP 140/88 (BP Location: Right Arm)   Pulse 83   Temp 98.9 F (37.2 C) (Oral)   Resp 14   Ht 1.524 m (5')   Wt 99.8 kg   SpO2 95%   BMI 42.97 kg/m   Physical Exam CONSTITUTIONAL: Well developed/well nourished  HEAD: Normocephalic/atraumatic EYES: EOMI/PERRL ENMT: Mucous membranes moist NECK: supple no meningeal signs SPINE/BACK:entire spine nontender CV: S1/S2 noted, no murmurs/rubs/gallops noted LUNGS: Lungs are clear to auscultation bilaterally, no apparent distress ABDOMEN: soft, nontender, no rebound or guarding, bowel sounds noted throughout abdomen GU:no cva tenderness NEURO: Pt is awake/alert/appropriate, moves all extremitiesx4.  No facial droop.   EXTREMITIES: pulses normal/equal, full ROM, chronic edema to lower extremities SKIN: warm, color normal PSYCH: no abnormalities of mood noted, alert and oriented to situation   ED Treatments / Results  Labs (all labs ordered are listed, but only abnormal results are displayed) Labs Reviewed  BASIC METABOLIC PANEL -  Abnormal; Notable for the following components:      Result Value   Glucose, Bld 222 (*)    All other components within normal limits  CBC - Abnormal; Notable for the following components:   Hemoglobin 11.5 (*)    HCT 35.0 (*)    All other components within normal limits  I-STAT BETA HCG BLOOD, ED ([facility], [facility], [facility] ONLY)  TROPONIN I (HIGH SENSITIVITY)  TROPONIN I (HIGH SENSITIVITY)    EKG EKG Interpretation  Date/Time:  Saturday May 11 2019 20:09:00 EST Ventricular Rate:  68 PR Interval:  174 QRS Duration: 84 QT Interval:  392 QTC Calculation: 416 R Axis:   51 Text Interpretation: Sinus rhythm with marked sinus arrhythmia Otherwise normal ECG Interpretation limited secondary to artifact Confirmed by Zadie Rhine ([id number]) on 05/12/2019 2:16:32 AM   Radiology Dg Chest 2 View  Result Date: 05/11/2019 CLINICAL DATA:  Mid chest pain, shortness of breath EXAM: CHEST - 2 VIEW COMPARISON:  04/11/2014 FINDINGS: Linear subsegmental atelectasis or scarring in the lingula. No confluent opacities or effusions. Heart is normal size. No acute bony abnormality. IMPRESSION: No active cardiopulmonary disease. Electronically  Signed   By: Charlett Nose M.D.   On: 05/11/2019 20:41    Procedures Procedures    Medications Ordered in ED Medications  nitroGLYCERIN (NITROSTAT) SL tablet 0.4 mg (0.4 mg Sublingual Given 05/12/19 0354)  sodium chloride flush (NS) 0.9 % injection 3 mL (3 mLs Intravenous Given 05/12/19 0355)  aspirin chewable tablet 324 mg (324 mg Oral Given 05/12/19 0354)     Initial Impression / Assessment and Plan / ED Course  I have reviewed the triage vital signs and the nursing notes.  Pertinent labs & imaging results that were available during my care of the patient were reviewed by me and considered in my medical decision making (see chart for details).     HEAR Score: 3  Patient presented with chest pain that she had for over a day She was well-appearing.  Troponin is negative.  No acute EKG changes.  Denies any pleuritic pain to suggest PE. She appears comfortable, suspicion for acute aortic dissection is low I feel she is safe for discharge home.  She did request nitroglycerin at discharge.  She reports she has PCP follow-up next week and may need outpatient stress echocardiogram We discussed strict ER return precautions Final Clinical Impressions(s) / ED Diagnoses   Final diagnoses:  Precordial pain    ED Discharge Orders         Ordered    nitroGLYCERIN (NITROSTAT) 0.4 MG SL tablet  Every 5 min PRN     05/12/19 0444           Zadie Rhine, MD 05/12/19 [phone]

## 2019-05-13 ENCOUNTER — Encounter: Payer: Self-pay | Admitting: Nurse Practitioner

## 2019-05-13 ENCOUNTER — Ambulatory Visit: Payer: BC Managed Care – PPO | Admitting: Nurse Practitioner

## 2019-05-13 ENCOUNTER — Ambulatory Visit (INDEPENDENT_AMBULATORY_CARE_PROVIDER_SITE_OTHER): Payer: BC Managed Care – PPO | Admitting: Neurology

## 2019-05-13 ENCOUNTER — Other Ambulatory Visit: Payer: Self-pay

## 2019-05-13 VITALS — BP 122/76 | HR 71 | Temp 98.2°F | Ht 61.0 in | Wt 228.6 lb

## 2019-05-13 DIAGNOSIS — R0789 Other chest pain: Secondary | ICD-10-CM | POA: Diagnosis not present

## 2019-05-13 DIAGNOSIS — M545 Low back pain: Secondary | ICD-10-CM

## 2019-05-13 DIAGNOSIS — L84 Corns and callosities: Secondary | ICD-10-CM

## 2019-05-13 DIAGNOSIS — Z9989 Dependence on other enabling machines and devices: Secondary | ICD-10-CM

## 2019-05-13 DIAGNOSIS — G8929 Other chronic pain: Secondary | ICD-10-CM

## 2019-05-13 DIAGNOSIS — Z0000 Encounter for general adult medical examination without abnormal findings: Secondary | ICD-10-CM | POA: Diagnosis not present

## 2019-05-13 DIAGNOSIS — G4733 Obstructive sleep apnea (adult) (pediatric): Secondary | ICD-10-CM

## 2019-05-13 DIAGNOSIS — E119 Type 2 diabetes mellitus without complications: Secondary | ICD-10-CM

## 2019-05-13 DIAGNOSIS — R351 Nocturia: Secondary | ICD-10-CM

## 2019-05-13 DIAGNOSIS — Z79899 Other long term (current) drug therapy: Secondary | ICD-10-CM

## 2019-05-13 DIAGNOSIS — G4719 Other hypersomnia: Secondary | ICD-10-CM

## 2019-05-13 DIAGNOSIS — I10 Essential (primary) hypertension: Secondary | ICD-10-CM

## 2019-05-13 DIAGNOSIS — Z23 Encounter for immunization: Secondary | ICD-10-CM

## 2019-05-13 LAB — POCT URINALYSIS DIPSTICK
Bilirubin, UA: NEGATIVE
Blood, UA: NEGATIVE
Glucose, UA: NEGATIVE
Ketones, UA: NEGATIVE
Nitrite, UA: NEGATIVE
Protein, UA: NEGATIVE
Spec Grav, UA: 1.025 (ref 1.010–1.025)
Urobilinogen, UA: 0.2 E.U./dL
pH, UA: 5.5 (ref 5.0–8.0)

## 2019-05-13 LAB — POCT UA - MICROALBUMIN
Albumin/Creatinine Ratio, Urine, POC: 30
Creatinine, POC: 200 mg/dL
Microalbumin Ur, POC: 30 mg/L

## 2019-05-13 MED ORDER — SHINGRIX 50 MCG/0.5ML IM SUSR: 0.5000 mL | Freq: Once | INTRAMUSCULAR | 0 refills | Status: AC

## 2019-05-13 MED ORDER — TRAMADOL HCL 50 MG PO TABS: 50.0000 mg | ORAL_TABLET | Freq: Four times a day (QID) | ORAL | 0 refills | Status: DC | PRN

## 2019-05-13 NOTE — Progress Notes (Signed)
Subjective:     Patient ID: Amanda Alexander , female    DOB: [birth year] , 58 y.o.   MRN: [id number]   Chief Complaint  Patient presents with  . Annual Exam    HPI  Here for HM  Diabetes She presents for her follow-up diabetic visit. She has type 2 diabetes mellitus. Her disease course has been stable. Pertinent negatives for hypoglycemia include no confusion, dizziness or headaches. Pertinent negatives for diabetes include no blurred vision, no chest pain, no polydipsia, no polyphagia and no polyuria. There are no diabetic complications. Risk factors for coronary artery disease include sedentary lifestyle and obesity. Current diabetic treatment includes oral agent (dual therapy). She is compliant with treatment all of the time. She is following a generally unhealthy diet. When asked about meal planning, she reported none. She rarely participates in exercise. (She has been checking her blood sugar lately. ) An ACE inhibitor/angiotensin II receptor blocker is being taken. She does not see a podiatrist.Eye exam is not current (appt on 14th of august for her cataracts).   The patient states she post menopausal status  Mammogram last done 12/10/2018.  Negative for: breast discharge, breast lump(s), breast pain and breast self exam.  Pertinent negatives include abnormal bleeding (hematology), anxiety, decreased libido, depression, difficulty falling sleep, dyspareunia, history of infertility, nocturia, sexual dysfunction, sleep disturbances, urinary incontinence, urinary urgency, vaginal discharge and vaginal itching. Diet regular. The patient states her exercise level is minimal.  Walks at work - [employer]    The patient's tobacco use is:  Social History   Tobacco Use  Smoking Status Never Smoker  Smokeless Tobacco Never Used   She has been exposed to passive smoke. The patient's alcohol use is:  Social History   Substance and Sexual Activity  Alcohol Use No  . Alcohol/week: 0.0 standard  drinks   Additional information: Last pap 05/07/2018, next one scheduled for 05/07/2021.   Past Medical History:  Diagnosis Date  . Diabetes mellitus without complication ([place])   . GERD (gastroesophageal reflux disease) 05/23/2014  . Hypertension   . Lower extremity edema 07/15/2016  . OSA on CPAP      Family History  Problem Relation Age of Onset  . Heart disease Mother   . Diabetes Mother   . Other Father        Polio  . Lung cancer Father   . Breast cancer Sister   . Arthritis Sister   . Cancer Sister   . COPD Sister   . Diabetes Brother   . Hyperlipidemia Brother   . Other Brother        polio and gun shot death  . Sleep apnea Son      Current Outpatient Medications:  .  ACCU-CHEK FASTCLIX LANCETS MISC, 1 Container by Does not apply route 2 (two) times daily., Disp: 102 each, Rfl: 0 .  aspirin EC 81 MG tablet, Take 81 mg by mouth daily., Disp: , Rfl:  .  diclofenac sodium (VOLTAREN) 1 % GEL, Apply 2 g topically 4 (four) times daily., Disp: 50 g, Rfl: 2 .  furosemide (LASIX) 40 MG tablet, Take 1 tablet (40 mg total) by mouth daily., Disp: 90 tablet, Rfl: 1 .  glucose blood (ACCU-CHEK GUIDE) test strip, 1 each by Other route as needed for other. Use as instructed, Disp: , Rfl:  .  glucose blood (ACCU-CHEK SMARTVIEW) test strip, 1 each by Other route 2 (two) times daily at 8 am and 10 pm. Use to  check blood sugar twice daily at 8am and 10pm. diag code E11.65. noninsulin dependent, Disp: 100 each, Rfl: 12 .  Insulin Pen Needle (SURE COMFORT PEN NEEDLES) 32G X 6 MM MISC, USE TO inject EVERY DAY, Disp: 100 each, Rfl: 2 .  lisinopril (PRINIVIL,ZESTRIL) 40 MG tablet, Take 1 tablet (40 mg total) by mouth daily., Disp: 90 tablet, Rfl: 1 .  metFORMIN (GLUCOPHAGE) 1000 MG tablet, TAKE ONE TABLET BY MOUTH TWICE DAILY WITH breakfast AND dinner (Patient taking differently: Take 1,000 mg by mouth 2 (two) times daily with a meal. ), Disp: 180 tablet, Rfl: 1 .  nitroGLYCERIN (NITROSTAT) 0.4  MG SL tablet, Place 1 tablet (0.4 mg total) under the tongue every 5 (five) minutes as needed for chest pain., Disp: 30 tablet, Rfl: 0 .  OZEMPIC, 0.25 OR 0.5 MG/DOSE, 2 MG/1.5ML SOPN, Inject 0.5 mg into the skin once a week. (Patient taking differently: Take 0.5 mg by mouth every Friday. ), Disp: 3 pen, Rfl: 0 .  pantoprazole (PROTONIX) 40 MG tablet, Take 1 tablet (40 mg total) by mouth daily. NEED OV., Disp: 90 tablet, Rfl: 1 .  pravastatin (PRAVACHOL) 20 MG tablet, take 1 tablet by oral route every day (Patient taking differently: Take 20 mg by mouth every evening. ), Disp: 90 tablet, Rfl: 1 .  TOUJEO SOLOSTAR 300 UNIT/ML SOPN, 50 Units by Subcutaneous Infusion route daily for 30 days. Inject 50 units sq qhs (Patient taking differently: Inject 50 Units into the skin at bedtime. ), Disp: 1.5 mL, Rfl: 1 .  traMADol (ULTRAM) 50 MG tablet, Take 1 tablet (50 mg total) by mouth every 6 (six) hours as needed. (Patient taking differently: Take 50 mg by mouth every 6 (six) hours as needed for moderate pain. ), Disp: 30 tablet, Rfl: 0   No Known Allergies   Review of Systems  Constitutional: Negative.   HENT: Negative.   Eyes: Negative.  Negative for blurred vision.  Respiratory: Negative.  Negative for cough and chest tightness.   Cardiovascular: Negative for chest pain.       Had chest pain on 05/11/2019.   Gastrointestinal: Negative.   Endocrine: Negative.  Negative for polydipsia, polyphagia and polyuria.  Genitourinary: Negative.   Musculoskeletal: Negative.   Skin: Negative.   Allergic/Immunologic: Negative.   Neurological: Negative.  Negative for dizziness and headaches.       Left 2nd finger had numbness.    Hematological: Negative.   Psychiatric/Behavioral: Negative.  Negative for confusion.     Today's Vitals   05/13/19 0914  BP: 122/76  Pulse: 71  Temp: 98.2 F (36.8 C)  TempSrc: Oral  Weight: 228 lb 9.6 oz (103.7 kg)  Height: _0  (1.549 m)  PainSc: 2   PainLoc: Chest    Body mass index is 43.19 kg/m.   Objective:  Physical Exam Constitutional:      General: She is not in acute distress.    Appearance: Normal appearance. She is well-developed. She is obese.  HENT:     Head: Normocephalic and atraumatic.     Right Ear: Hearing, tympanic membrane, ear canal and external ear normal.     Left Ear: Hearing, tympanic membrane, ear canal and external ear normal.     Nose: Nose normal.     Mouth/Throat:     Mouth: Mucous membranes are moist.  Eyes:     General: Lids are normal.     Conjunctiva/sclera: Conjunctivae normal.     Pupils: Pupils are equal, round, and reactive  to light.     Funduscopic exam:    Right eye: No papilledema.        Left eye: No papilledema.  Neck:     Musculoskeletal: Full passive range of motion without pain, normal range of motion and neck supple.     Thyroid: No thyroid mass.     Vascular: No carotid bruit.  Cardiovascular:     Rate and Rhythm: Normal rate and regular rhythm.     Pulses: Normal pulses.     Heart sounds: Normal heart sounds. No murmur.  Pulmonary:     Effort: Pulmonary effort is normal. No respiratory distress.     Breath sounds: Normal breath sounds.  Abdominal:     General: Abdomen is flat. Bowel sounds are normal.     Palpations: Abdomen is soft.  Musculoskeletal: Normal range of motion.        General: No swelling.     Right lower leg: No edema.     Left lower leg: No edema.  Skin:    General: Skin is warm and dry.     Capillary Refill: Capillary refill takes less than 2 seconds.  Neurological:     General: No focal deficit present.     Mental Status: She is alert and oriented to person, place, and time.     Cranial Nerves: No cranial nerve deficit.     Sensory: No sensory deficit.  Psychiatric:        Mood and Affect: Mood normal.        Behavior: Behavior normal.        Thought Content: Thought content normal.        Judgment: Judgment normal.         Assessment And Plan:     1.  Health maintenance examination Behavior modifications discussed and diet history reviewed.   Pt will continue to exercise regularly and modify diet with low GI, plant based foods and decrease intake of processed foods.  Recommend intake of daily multivitamin, Vitamin D, and calcium.  Recommend mammogram and colonoscopy for preventive screenings, as well as recommend immunizations that include influenza, TDAP, and Shingles  2. Type 2 diabetes mellitus without complication, unspecified whether long term insulin use (HCC)  Chronic, poorly controlled  Strongly advised to take her medications as directed  Encouraged to limit intake of sugary foods and drinks  Encouraged to increase physical activity to 150 minutes per week as tolerated  Diabetic foot exam done - Hemoglobin A1c - Lipid Profile - CBC without diff - CMP14+EGFR  3. Essential hypertension . B/P is well controlled.  . CMP ordered to check renal function.  . The importance of regular exercise and dietary modification was stressed to the patient.  - POCT Urinalysis Dipstick (81002) - POCT UA - Microalbumin - Lipid Profile - CBC without diff - CMP14+EGFR  4. OSA on CPAP  Chronic, wearing cpap most nights  5. Chest discomfort  Intermittent chest pain occurred 2 days ago.  Will refer to cardiology  6. Other long term (current) drug therapy  - TSH  7. Callus of foot  Right foot with thickened skin appears to be callus  Will refer to podiatry - Ambulatory referral to Podiatry  8. Chronic bilateral low back pain without sciatica  Chronic stable, will provide limited amount of tramadol - traMADol (ULTRAM) 50 MG tablet; Take 1 tablet (50 mg total) by mouth every 6 (six) hours as needed.  Dispense: 30 tablet; Refill: 0  9. Encounter for  immunization  Shingles vaccine sent to pharmacy - Zoster Vaccine Adjuvanted [facility]) injection; Inject 0.5 mLs into the muscle once for 1 dose.  Dispense: 0.5 mL; Refill:  0   Minette Brine, FNP    THE PATIENT IS ENCOURAGED TO PRACTICE SOCIAL DISTANCING DUE TO THE COVID-19 PANDEMIC.

## 2019-05-14 LAB — CMP14+EGFR
ALT: 16 IU/L (ref 0–32)
AST: 13 IU/L (ref 0–40)
Albumin/Globulin Ratio: 1.3 (ref 1.2–2.2)
Albumin: 4.1 g/dL (ref 3.8–4.9)
Alkaline Phosphatase: 99 IU/L (ref 39–117)
BUN/Creatinine Ratio: 16 (ref 12–28)
BUN: 13 mg/dL (ref 8–27)
Bilirubin Total: 0.3 mg/dL (ref 0.0–1.2)
CO2: 25 mmol/L (ref 20–29)
Calcium: 9.1 mg/dL (ref 8.7–10.3)
Chloride: 103 mmol/L (ref 96–106)
Creatinine, Ser: 0.79 mg/dL (ref 0.57–1.00)
GFR calc Af Amer: 94 mL/min/{1.73_m2} (ref >59)
GFR calc non Af Amer: 82 mL/min/{1.73_m2} (ref >59)
Globulin, Total: 3.2 g/dL (ref 1.5–4.5)
Glucose: 137 mg/dL — ABNORMAL HIGH (ref 65–99)
Potassium: 4.1 mmol/L (ref 3.5–5.2)
Sodium: 142 mmol/L (ref 134–144)
Total Protein: 7.3 g/dL (ref 6.0–8.5)

## 2019-05-14 LAB — TSH: TSH: 1.7 u[IU]/mL (ref 0.450–4.500)

## 2019-05-14 LAB — LIPID PANEL
Chol/HDL Ratio: 2.6 ratio (ref 0.0–4.4)
Cholesterol, Total: 143 mg/dL (ref 100–199)
HDL: 54 mg/dL (ref >39)
LDL Chol Calc (NIH): 72 mg/dL (ref 0–99)
Triglycerides: 92 mg/dL (ref 0–149)
VLDL Cholesterol Cal: 17 mg/dL (ref 5–40)

## 2019-05-14 LAB — CBC
Hematocrit: 34.9 % (ref 34.0–46.6)
Hemoglobin: 11.7 g/dL (ref 11.1–15.9)
MCH: 29.3 pg (ref 26.6–33.0)
MCHC: 33.5 g/dL (ref 31.5–35.7)
MCV: 87 fL (ref 79–97)
Platelets: 308 10*3/uL (ref 150–450)
RBC: 4 x10E6/uL (ref 3.77–5.28)
RDW: 14.2 % (ref 11.7–15.4)
WBC: 5.7 10*3/uL (ref 3.4–10.8)

## 2019-05-14 LAB — HEMOGLOBIN A1C
Est. average glucose Bld gHb Est-mCnc: 183 mg/dL
Hgb A1c MFr Bld: 8 % — ABNORMAL HIGH (ref 4.8–5.6)

## 2019-05-16 ENCOUNTER — Encounter: Payer: Self-pay | Admitting: Internal Medicine

## 2019-05-21 NOTE — Patient Instructions (Signed)
Health Maintenance, Female Adopting a healthy lifestyle and getting preventive care are important in promoting health and wellness. Ask your health care provider about:  The right schedule for you to have regular tests and exams.  Things you can do on your own to prevent diseases and keep yourself healthy. What should I know about diet, weight, and exercise? Eat a healthy diet   Eat a diet that includes plenty of vegetables, fruits, low-fat dairy products, and lean protein.  Do not eat a lot of foods that are high in solid fats, added sugars, or sodium. Maintain a healthy weight Body mass index (BMI) is used to identify weight problems. It estimates body fat based on height and weight. Your health care provider can help determine your BMI and help you achieve or maintain a healthy weight. Get regular exercise Get regular exercise. This is one of the most important things you can do for your health. Most adults should:  Exercise for at least 150 minutes each week. The exercise should increase your heart rate and make you sweat (moderate-intensity exercise).  Do strengthening exercises at least twice a week. This is in addition to the moderate-intensity exercise.  Spend less time sitting. Even light physical activity can be beneficial. Watch cholesterol and blood lipids Have your blood tested for lipids and cholesterol at 60 years of age, then have this test every 5 years. Have your cholesterol levels checked more often if:  Your lipid or cholesterol levels are high.  You are older than 60 years of age.  You are at high risk for heart disease. What should I know about cancer screening? Depending on your health history and family history, you may need to have cancer screening at various ages. This may include screening for:  Breast cancer.  Cervical cancer.  Colorectal cancer.  Skin cancer.  Lung cancer. What should I know about heart disease, diabetes, and high blood  pressure? Blood pressure and heart disease  High blood pressure causes heart disease and increases the risk of stroke. This is more likely to develop in people who have high blood pressure readings, are of African descent, or are overweight.  Have your blood pressure checked: ? Every 3-5 years if you are 18-39 years of age. ? Every year if you are 40 years old or older. Diabetes Have regular diabetes screenings. This checks your fasting blood sugar level. Have the screening done:  Once every three years after age 40 if you are at a normal weight and have a low risk for diabetes.  More often and at a younger age if you are overweight or have a high risk for diabetes. What should I know about preventing infection? Hepatitis B If you have a higher risk for hepatitis B, you should be screened for this virus. Talk with your health care provider to find out if you are at risk for hepatitis B infection. Hepatitis C Testing is recommended for:  Everyone born from 1945 through 1965.  Anyone with known risk factors for hepatitis C. Sexually transmitted infections (STIs)  Get screened for STIs, including gonorrhea and chlamydia, if: ? You are sexually active and are younger than 60 years of age. ? You are older than 60 years of age and your health care provider tells you that you are at risk for this type of infection. ? Your sexual activity has changed since you were last screened, and you are at increased risk for chlamydia or gonorrhea. Ask your health care provider if   you are at risk.  Ask your health care provider about whether you are at high risk for HIV. Your health care provider may recommend a prescription medicine to help prevent HIV infection. If you choose to take medicine to prevent HIV, you should first get tested for HIV. You should then be tested every 3 months for as long as you are taking the medicine. Pregnancy  If you are about to stop having your period (premenopausal) and  you may become pregnant, seek counseling before you get pregnant.  Take 400 to 800 micrograms (mcg) of folic acid every day if you become pregnant.  Ask for birth control (contraception) if you want to prevent pregnancy. Osteoporosis and menopause Osteoporosis is a disease in which the bones lose minerals and strength with aging. This can result in bone fractures. If you are 65 years old or older, or if you are at risk for osteoporosis and fractures, ask your health care provider if you should:  Be screened for bone loss.  Take a calcium or vitamin D supplement to lower your risk of fractures.  Be given hormone replacement therapy (HRT) to treat symptoms of menopause. Follow these instructions at home: Lifestyle  Do not use any products that contain nicotine or tobacco, such as cigarettes, e-cigarettes, and chewing tobacco. If you need help quitting, ask your health care provider.  Do not use street drugs.  Do not share needles.  Ask your health care provider for help if you need support or information about quitting drugs. Alcohol use  Do not drink alcohol if: ? Your health care provider tells you not to drink. ? You are pregnant, may be pregnant, or are planning to become pregnant.  If you drink alcohol: ? Limit how much you use to 0-1 drink a day. ? Limit intake if you are breastfeeding.  Be aware of how much alcohol is in your drink. In the [place] one drink equals one 12 oz bottle of beer (355 mL), one 5 oz glass of wine (148 mL), or one 1 oz glass of hard liquor (44 mL). General instructions  Schedule regular health, dental, and eye exams.  Stay current with your vaccines.  Tell your health care provider if: ? You often feel depressed. ? You have ever been abused or do not feel safe at home. Summary  Adopting a healthy lifestyle and getting preventive care are important in promoting health and wellness.  Follow your health care provider's instructions about healthy  diet, exercising, and getting tested or screened for diseases.  Follow your health care provider's instructions on monitoring your cholesterol and blood pressure. This information is not intended to replace advice given to you by your health care provider. Make sure you discuss any questions you have with your health care provider. Document Released: 12/13/2010 Document Revised: 05/23/2018 Document Reviewed: 05/23/2018 Elsevier Patient Education  2020 [employer]  

## 2019-05-22 ENCOUNTER — Other Ambulatory Visit: Payer: Self-pay | Admitting: Internal Medicine

## 2019-05-23 ENCOUNTER — Telehealth: Payer: Self-pay

## 2019-05-23 NOTE — Addendum Note (Signed)
Addended by: Star Age on: 05/23/2019 07:53 AM   Modules accepted: Orders

## 2019-05-23 NOTE — Telephone Encounter (Signed)
-----   Message from Star Age, MD sent at 05/23/2019  7:53 AM EST ----- Patient referred by Minette Brine, NP, seen by me on 04/22/19, HST on 05/13/19.    Please call and notify the patient that the recent home sleep test showed obstructive sleep apnea. OSA is overall mild, but worth treating to see if she feels better after treatment. She had been on CPAP for years and felt, it had helped. To that end I recommend treatment for this in the form of autoPAP, which means, that we don't have to bring her in for a sleep study with CPAP, but will let her try an autoPAP machine at home, through a DME company (of her choice, or as per insurance requirement). The DME representative will educate her on how to use the machine, how to put the mask on, etc. I have placed an order in the chart. Please send referral, talk to patient, send report to referring MD. We will need a FU in sleep clinic for 10 weeks post-PAP set up, please arrange that with me or one of our NPs. Thanks,   Star Age, MD, PhD [facility])

## 2019-05-23 NOTE — Progress Notes (Signed)
Patient referred by Minette Brine, NP, seen by me on 04/22/19, HST on 05/13/19.    Please call and notify the patient that the recent home sleep test showed obstructive sleep apnea. OSA is overall mild, but worth treating to see if she feels better after treatment. She had been on CPAP for years and felt, it had helped. To that end I recommend treatment for this in the form of autoPAP, which means, that we don't have to bring her in for a sleep study with CPAP, but will let her try an autoPAP machine at home, through a DME company (of her choice, or as per insurance requirement). The DME representative will educate her on how to use the machine, how to put the mask on, etc. I have placed an order in the chart. Please send referral, talk to patient, send report to referring MD. We will need a FU in sleep clinic for 10 weeks post-PAP set up, please arrange that with me or one of our NPs. Thanks,   Star Age, MD, PhD [facility])

## 2019-05-23 NOTE — Telephone Encounter (Signed)
Pt returned my call. I advised pt that Dr. Rexene Alberts reviewed their sleep study results and found that pt has mild osa. Dr. Rexene Alberts recommends that pt start an auto pap at home. I reviewed PAP compliance expectations with the pt. Pt is agreeable to starting an auto-PAP. I advised pt that an order will be sent to a DME, [facility], and [facility] will call the pt within about one week after they file with the pt's insurance. [facility] will show the pt how to use the machine, fit for masks, and troubleshoot the auto-PAP if needed. A follow up appt was made for insurance purposes with Amy, NP on 07/29/19 at 11:30am. Pt verbalized understanding to arrive 15 minutes early and bring their auto-PAP. A letter with all of this information in it will be mailed to the pt as a reminder. I verified with the pt that the address we have on file is correct. Pt verbalized understanding of results. Pt had no questions at this time but was encouraged to call back if questions arise. I have sent the order to [facility] and have received confirmation that they have received the order.

## 2019-05-23 NOTE — Procedures (Signed)
Patient Information     First Name: Amanda Last Name: Alexander ID: [id number]  Birth Date: Aug 31, 1960 Age: 58 Gender: Female  Referring Provider: Dorothyann Peng, MD BMI: 45.0 (W=229 lb, H=5' 0'')  Neck Circ.:  15 '' Epworth:  18/24   Sleep Study Information    Study Date: May 13, 2019 S/H/A Version: [id number] / 4.1.1528 / 30  History:    58 year old woman with a history of diabetes, hypertension, lower extremity edema, reflux disease and obesity, who was previously diagnosed with obstructive sleep apnea and placed on CPAP therapy. She no longer has a working CPAP machine.      Summary & Diagnosis:     OSA  Recommendations:     This home sleep test demonstrates overall mild obstructive sleep apnea with a total AHI of 14.5/hour and O2 nadir of 83%. Given the patient's medical history and sleep related complaints, treatment with positive airway pressure is recommended. This can be achieved in the form of autoPAP trial/titration at home. A  full night CPAP titration study will help with proper treatment settings and mask fitting if needed. Alternative treatments may include weight loss along with avoidance of the supine sleep position, or an oral appliance in appropriate candidates; surgical evaluation can be considered in certain patients. Please note that untreated obstructive sleep apnea may carry additional perioperative morbidity. Patients with significant obstructive sleep apnea should receive perioperative PAP therapy and the surgeons and particularly the anesthesiologist should be informed of the diagnosis and the severity of the sleep disordered breathing. The patient should be cautioned not to drive, work at heights, or operate dangerous or heavy equipment when tired or sleepy. Review and reiteration of good sleep hygiene measures should be pursued with any patient. Other causes of the patient's symptoms, including circadian rhythm disturbances, an underlying mood disorder, medication effect  and/or an underlying medical problem cannot be ruled out based on this test. Clinical correlation is recommended.  The patient and her referring provider will be notified of the test results. The patient will be seen in follow up in sleep clinic at [facility].  I certify that I have reviewed the raw data recording prior to the issuance of this report in accordance with the standards of the American Academy of Sleep Medicine (AASM).  Huston Foley, MD, PhD [facility]) Diplomat, ABPN (Neurology and Sleep)            Sleep Summary  Oxygen Saturation Statistics   Start Study Time: End Study Time: Total Recording Time:        10:23:59 PM       7:13:50 AM 8 h, 49 min  Total Sleep Time % REM of Sleep Time:  6 h, 54 min  16.2    Mean: 95 Minimum: 83 Maximum: 100  Mean of Desaturations Nadirs (%):   91  Oxygen Desaturation. %:   4-9 10-20 >20 Total  Events Number Total    22  1 95.7 4.3  0 0.0  23 100.0  Oxygen Saturation: <90 <=88 <85 <80 <70  Duration (minutes): Sleep % 1.4 0.3  1.1 0.2  0.3 0.0 0.0 0.0 0.0 0.0     Respiratory Indices      Total Events REM NREM All Night  pRDI:  73  pAHI:  60 ODI:  23  pAHIc:  0  % CSR: 0.0 29.0 29.0 15.2 0.0 15.3 11.5 3.5 0.0 17.7 14.5 5.6 0.0       Pulse Rate  Statistics during Sleep (BPM)      Mean: 75 Minimum: 58 Maximum:  92    Indices are calculated using technically valid sleep time of  5 h, 7 min. Central-Indices are calculated using technically valid sleep time of  4  hrs, 7 min. pRDI/pAHI are calculated using oxi desaturations ? 3%  Body Position Statistics  Position Supine Prone Right Left Non-Supine  Sleep (min) 332.9 0.0 0.0 82.0 82.0  Sleep % 80.2 0.0 0.0 19.8 19.8  pRDI 16.9 N/A N/A 20.0 20.0  pAHI 15.2 N/A N/A 12.7 12.7  ODI 6.3 N/A N/A 3.6 3.6     Snoring Statistics Snoring Level (dB) >40 >50 >60 >70 >80 >Threshold (45)  Sleep (min) 324.4 44.2 0.9 0.4 0.0 127.6  Sleep % 78.2  10.7 0.2 0.1 0.0 30.8    Mean: 44 dB Sleep Stages Chart                      pAHI=14.5                                  Mild              Moderate                    Severe                                                 5              15                    30

## 2019-05-23 NOTE — Telephone Encounter (Signed)
I called pt to discuss her sleep study results. No answer, left a message asking her to call me back. 

## 2019-05-31 ENCOUNTER — Encounter (INDEPENDENT_AMBULATORY_CARE_PROVIDER_SITE_OTHER): Payer: Self-pay

## 2019-05-31 ENCOUNTER — Other Ambulatory Visit: Payer: Self-pay

## 2019-05-31 ENCOUNTER — Encounter: Payer: Self-pay | Admitting: General Practice

## 2019-05-31 ENCOUNTER — Ambulatory Visit (INDEPENDENT_AMBULATORY_CARE_PROVIDER_SITE_OTHER): Payer: BC Managed Care – PPO | Admitting: General Practice

## 2019-05-31 VITALS — BP 128/72 | HR 85 | Ht 61.0 in | Wt 231.8 lb

## 2019-05-31 DIAGNOSIS — R600 Localized edema: Secondary | ICD-10-CM

## 2019-05-31 DIAGNOSIS — I10 Essential (primary) hypertension: Secondary | ICD-10-CM | POA: Diagnosis not present

## 2019-05-31 DIAGNOSIS — K219 Gastro-esophageal reflux disease without esophagitis: Secondary | ICD-10-CM

## 2019-05-31 DIAGNOSIS — R0789 Other chest pain: Secondary | ICD-10-CM | POA: Diagnosis not present

## 2019-05-31 DIAGNOSIS — R0600 Dyspnea, unspecified: Secondary | ICD-10-CM

## 2019-05-31 DIAGNOSIS — R0609 Other forms of dyspnea: Secondary | ICD-10-CM

## 2019-05-31 NOTE — Progress Notes (Signed)
Cardiology Clinic Note   Patient Name: Amanda Alexander Date of Encounter: 05/31/2019  Primary Care Provider:  Glendale Chard, MD Primary Cardiologist:  No primary care provider on file.  Patient Profile    Amanda Alexander 58 year old female presents today for a follow-up of her precordial chest pain.  Past Medical History    Past Medical History:  Diagnosis Date  . Diabetes mellitus without complication ([place])   . GERD (gastroesophageal reflux disease) 05/23/2014  . Hypertension   . Lower extremity edema 07/15/2016  . OSA on CPAP    Past Surgical History:  Procedure Laterality Date  . TONSILLECTOMY Bilateral     Allergies  No Known Allergies  History of Present Illness  Amanda Alexander has a past medical history of diabetes, hypertension, and atypical chest pain.  She was recently seen in the emergency department on 05/12/2019.  She describes chest pain that was mid/left-sided heaviness, pressure, tightness that was not worse with exertion but was relieved by nitroglycerin.  She denied nausea, diaphoresis, and arm/jaw/neck pain.  The pain had been consistent for about 6 hours and she was noted to have chronic edema in her lower extremities.  She was experiencing diarrhea at the time of the visit.  She had no acute EKG changes, her troponins were negative, and her chest x-ray was also negative.  She was discharged with a refill of her nitroglycerin.  She was seen 07/15/2016 for atypical chest pain after lifting her grandson.  This was nonexertional and no ischemia evaluation was indicated time.  Her blood pressure was poorly controlled.  She did report shortness of breath and had lower extremity edema.  Her echocardiogram from 07/2016 showed an LVEF of 60 to 65% with grade 1 diastolic dysfunction, mild mitral regurgitation, and mild tricuspid regurgitation.  Her hydrochlorothiazide was switched to furosemide which improved her lower extremity edema.  A ETT 08/2016 was also ordered due to  her continued complaints of CP, the ETT was negative for ischemia.  She achieved 7 METS on a Bruce protocol.  Around this time she also complained of leg pain and was referred for ABIs, they were never completed.  She was seen by Dr. Oval Linsey on 09/26/2016.  During that time she continued to have chest pressure.  The pressure was noted to be worse after eating peppermint and certainly types of foods.  She had been taking occasional OTC Tums but had run out of pantoprazole.  She denied exertional chest pain, orthopnea, and PND.  She had mild lower extremity edema that was improving with lower extremity support stockings.  She presents the clinic today and states she has not had any chest pain since being discharged from the emergency department.  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  She follows a low-sodium diet.  She states she has noticed increased fatigue and is in the process of getting a new CPAP machine.  She was much more rested when she used her machine consistently.  She does not monitor her blood pressure at home.  She denies chest pain, shortness of breath, increased lower extremity edema,  palpitations, melena, hematuria, hemoptysis, diaphoresis, weakness, presyncope, syncope, orthopnea, and PND.   Home Medications    Prior to Admission medications   Medication Sig Start Date End Date Taking? Authorizing Provider  ACCU-CHEK FASTCLIX LANCETS MISC 1 Container by Does not apply route 2 (two) times daily. 12/06/13  Carolan Clines, MD  aspirin EC 81 MG tablet Take 81 mg by mouth daily.    [provider]  diclofenac sodium (VOLTAREN) 1 % GEL Apply 2 g topically 4 (four) times daily. 12/31/18   Arnette Felts, FNP  furosemide (LASIX) 40 MG tablet Take 1 tablet (40 mg total) by mouth daily. 09/12/18 05/11/28  Dorothyann Peng, MD  glucose blood (ACCU-CHEK GUIDE) test strip 1 each by Other route as  needed for other. Use as instructed    [provider]  glucose blood (ACCU-CHEK SMARTVIEW) test strip 1 each by Other route 2 (two) times daily at 8 am and 10 pm. Use to check blood sugar twice daily at 8am and 10pm. diag code E11.65. noninsulin dependent 07/01/14   Carolan Clines, MD  Insulin Pen Needle (SURE COMFORT PEN NEEDLES) 32G X 6 MM MISC USE TO inject EVERY DAY 09/12/18   Dorothyann Peng, MD  lisinopril (ZESTRIL) 40 MG tablet Take 1 tablet (40 mg total) by mouth daily. 05/22/19 08/20/19  Arnette Felts, FNP  metFORMIN (GLUCOPHAGE) 1000 MG tablet TAKE ONE TABLET BY MOUTH TWICE DAILY WITH breakfast AND dinner Patient taking differently: Take 1,000 mg by mouth 2 (two) times daily with a meal.  11/22/18   Arnette Felts, FNP  nitroGLYCERIN (NITROSTAT) 0.4 MG SL tablet Place 1 tablet (0.4 mg total) under the tongue every 5 (five) minutes as needed for chest pain. 05/12/19   Zadie Rhine, MD  OZEMPIC, 0.25 OR 0.5 MG/DOSE, 2 MG/1.5ML SOPN Inject 0.5 mg into the skin once a week. Patient taking differently: Take 0.5 mg by mouth every Friday.  03/18/19   Arnette Felts, FNP  pantoprazole (PROTONIX) 40 MG tablet Take 1 tablet (40 mg total) by mouth daily. NEED OV. 12/10/18   Dorothyann Peng, MD  pravastatin (PRAVACHOL) 20 MG tablet take 1 tablet by oral route every day Patient taking differently: Take 20 mg by mouth every evening.  02/19/19   Arnette Felts, FNP  TOUJEO SOLOSTAR 300 UNIT/ML SOPN 50 Units by Subcutaneous Infusion route daily for 30 days. Inject 50 units sq qhs Patient taking differently: Inject 50 Units into the skin at bedtime.  04/13/19   Dorothyann Peng, MD  traMADol (ULTRAM) 50 MG tablet Take 1 tablet (50 mg total) by mouth every 6 (six) hours as needed. 05/13/19 05/12/20  Arnette Felts, FNP    Family History    Family History  Problem Relation Age of Onset  . Heart disease Mother   . Diabetes Mother   . Other Father        Polio  . Lung cancer Father   . Breast cancer Sister    . Arthritis Sister   . Cancer Sister   . COPD Sister   . Diabetes Brother   . Hyperlipidemia Brother   . Other Brother        polio and gun shot death  . Sleep apnea Son    She indicated that her mother is deceased. She indicated that her father is deceased. She indicated that only one of her six sisters is alive. She indicated that three of her four brothers are alive. She indicated that her maternal grandmother is deceased. She indicated that her maternal grandfather is deceased. She indicated that her paternal grandmother is deceased. She indicated that her paternal grandfather is deceased. She indicated that both of her daughters are alive. She indicated that her son is alive.  Social History    Social History   Socioeconomic History  .  Marital status: Widowed    Spouse name: Not on file  . Number of children: Not on file  . Years of education: Not on file  . Highest education level: Not on file  Occupational History  . Not on file  Tobacco Use  . Smoking status: Never Smoker  . Smokeless tobacco: Never Used  Substance and Sexual Activity  . Alcohol use: No    Alcohol/week: 0.0 standard drinks  . Drug use: No  . Sexual activity: Never  Other Topics Concern  . Not on file  Social History Narrative  . Not on file   Social Determinants of Health   Financial Resource Strain:   . Difficulty of Paying Living Expenses: Not on file  Food Insecurity:   . Worried About Programme researcher, broadcasting/film/videounning Out of Food in the Last Year: Not on file  . Ran Out of Food in the Last Year: Not on file  Transportation Needs:   . Lack of Transportation (Medical): Not on file  . Lack of Transportation (Non-Medical): Not on file  Physical Activity:   . Days of Exercise per Week: Not on file  . Minutes of Exercise per Session: Not on file  Stress:   . Feeling of Stress : Not on file  Social Connections:   . Frequency of Communication with Friends and Family: Not on file  . Frequency of Social Gatherings with  Friends and Family: Not on file  . Attends Religious Services: Not on file  . Active Member of Clubs or Organizations: Not on file  . Attends BankerClub or Organization Meetings: Not on file  . Marital Status: Not on file  Intimate Partner Violence:   . Fear of Current or Ex-Partner: Not on file  . Emotionally Abused: Not on file  . Physically Abused: Not on file  . Sexually Abused: Not on file     Review of Systems    General:  No chills, fever, night sweats or weight changes.  Cardiovascular:  No chest pain, dyspnea on exertion, edema, orthopnea, palpitations, paroxysmal nocturnal dyspnea. Dermatological: No rash, lesions/masses Respiratory: No cough, dyspnea Urologic: No hematuria, dysuria Abdominal:   No nausea, vomiting, diarrhea, bright red blood per rectum, melena, or hematemesis Neurologic:  No visual changes, wkns, changes in mental status. All other systems reviewed and are otherwise negative except as noted above.  Physical Exam    VS:  BP 128/72   Pulse 85   Ht 5\' 1"  (1.549 m)   Wt 231 lb 12.8 oz (105.1 kg)   BMI 43.80 kg/m  , BMI Body mass index is 43.8 kg/m. GEN: Well nourished, well developed, in no acute distress. HEENT: normal. Neck: Supple, no JVD, carotid bruits, or masses. Cardiac: RRR, no murmurs, rubs, or gallops. No clubbing, cyanosis, generalized nonpitting bilateral lower extremity edema.  Radials/DP/PT 2+ and equal bilaterally.  Respiratory:  Respirations regular and unlabored, clear to auscultation bilaterally. GI: Soft, nontender, nondistended, BS + x 4. MS: no deformity or atrophy. Skin: warm and dry, no rash. Neuro:  Strength and sensation are intact. Psych: Normal affect.  Accessory Clinical Findings    ECG personally reviewed by me today-normal sinus rhythm 77 bpm- No acute changes  EKG 05/13/2019 Sinus rhythm with sinus arrhythmia 68 bpm  EKG 09/26/2016 Sinus rhythm 67 bpm  Echocardiogram 08/01/2016 Study Conclusions  - Left ventricle:  The cavity size was normal. There was moderate   concentric hypertrophy. Systolic function was vigorous. The   estimated ejection fraction was in the range of  65% to 70%. Wall   motion was normal; there were no regional wall motion   abnormalities. Doppler parameters are consistent with abnormal   left ventricular relaxation (grade 1 diastolic dysfunction).   Doppler parameters are consistent with elevated ventricular   end-diastolic filling pressure. - Aortic valve: Trileaflet; normal thickness leaflets. There was no   regurgitation. - Aortic root: The aortic root was normal in size. - Mitral valve: Structurally normal valve. There was mild   regurgitation. - Left atrium: The atrium was mildly dilated. - Right ventricle: Systolic function was normal. - Right atrium: The atrium was normal in size. - Tricuspid valve: There was mild regurgitation. - Pulmonary arteries: Systolic pressure was mildly increased. PA   peak pressure: 31 mm Hg (S). - Inferior vena cava: The vessel was normal in size. - Pericardium, extracardiac: There was no pericardial effusion.  ETT 09/09/16:  Blood pressure demonstrated a normal response to exercise.  There was no ST segment deviation noted during stress.  Normal exercise treadmill stress test. Mildly decreased functional capacity. Normal BP response to exertion.  Assessment & Plan   1.  Chest discomfort-no chest pain since discharge.  05/11/2019 presented to the emergency department with chest pressure that has been present for greater than 6 hours.  No acute changes in her EKG, troponin is negative, chest x-ray negative.  Discharged with refill of her nitroglycerin.  Not cardiac in nature. Heart healthy low-sodium diet-salty 6 given Increase physical activity as tolerated-goal 150 minutes of moderate physical activity weekly Continue nitroglycerin 0.4 mg sublingual as needed   Lower extremity edema-generalized nonpitting lower extremity. Continue  furosemide 40 mg tablet daily Continue lower extremity support stockings Elevate extremities throughout the day when not active Heart healthy low-sodium diet-salty 6   Increased work of breathing-has been fairly sedentary with the COVID-19 pandemic.  Believe this continues to be due to deconditioning. Increase physical activity-goal 150 minutes of moderate physical activity each week  Essential hypertension-BP today 128/72.  She does not monitor at home. Continue lisinopril 40 mg tablet daily Heart healthy low-sodium diet-salty 6 given Increase physical activity as tolerated  GERD-controlled with pantoprazole medication. Continue pantoprazole 40 mg tablet daily  educated about acid reflux and GERD diet handout given  Disposition: Follow-up with Dr. Duke Salvia in 12 months.  Thomasene Ripple. Lavone Barrientes NP-C        [facility] [address] Office [phone] Fax [phone]

## 2019-05-31 NOTE — Patient Instructions (Addendum)
Special Instructions: PLEASE INCREASE PHYSICAL ACTIVITY 150 MINUTES A WEEK  PLEASE READ AND FOLLOW SALTY 6 ATTACHED  Reduce your risk of getting COVID-19 With your heart disease it is especially important for people at increased risk of severe illness from COVID-19, and those who live with them, to protect themselves from getting COVID-19. The best way to protect yourself and to help reduce the spread of the virus that causes COVID-19 is to: Marland Kitchen Limit your interactions with other people as much as possible. . Take precautions to prevent getting COVID-19 when you do interact with others. If you start feeling sick and think you may have COVID-19, get in touch with your healthcare provider within 24 hours.  Follow-Up: IN 12 months Please call our office 2 months in advance, OCT 2021 to schedule this [id number] appointment. Either In Person or Virtual You may see Skeet Latch, MD or one of the following Advanced Practice Providers on your designated Care Team:  Kerin Ransom, PA-C [place], [place] Coletta Memos, [place].    At [facility], you and your health needs are our priority.  As part of our continuing mission to provide you with exceptional heart care, we have created designated Provider Care Teams.  These Care Teams include your primary Cardiologist (physician) and Advanced Practice Providers (APPs -  Physician Assistants and Nurse Practitioners) who all work together to provide you with the care you need, when you need it.  Thank you for choosing [facility] at [facility]!!     Happy Holidays!!

## 2019-06-03 ENCOUNTER — Other Ambulatory Visit: Payer: Self-pay | Admitting: Nurse Practitioner

## 2019-06-03 ENCOUNTER — Telehealth: Payer: Self-pay

## 2019-06-03 NOTE — Telephone Encounter (Signed)
The pt said that she dropped off [employer] forms today to be filled out and that it's due on 12/23.  The pt was informed that it takes 7 - 10 business days on the forms.  The pt said that it's a matter of her job and if it could please be filled out she would appreciate it.

## 2019-06-04 ENCOUNTER — Telehealth: Payer: Self-pay

## 2019-06-04 NOTE — Telephone Encounter (Signed)
I called patient and left her a v/m notifying her that Ms.Laurance Flatten is currently out of the office and our policy for forms to be filled out is 7-10 business days. YRL,RMA

## 2019-06-04 NOTE — Telephone Encounter (Signed)
Pt LVM she had sleep study report faxed over and would like provider to view it when received

## 2019-06-10 ENCOUNTER — Other Ambulatory Visit: Payer: Self-pay | Admitting: Internal Medicine

## 2019-06-17 DIAGNOSIS — G4733 Obstructive sleep apnea (adult) (pediatric): Secondary | ICD-10-CM | POA: Diagnosis not present

## 2019-06-19 ENCOUNTER — Other Ambulatory Visit: Payer: Self-pay | Admitting: Internal Medicine

## 2019-06-19 DIAGNOSIS — K219 Gastro-esophageal reflux disease without esophagitis: Secondary | ICD-10-CM

## 2019-07-01 ENCOUNTER — Ambulatory Visit: Payer: Self-pay | Admitting: Podiatry

## 2019-07-03 ENCOUNTER — Ambulatory Visit: Payer: BC Managed Care – PPO | Admitting: Podiatry

## 2019-07-04 ENCOUNTER — Other Ambulatory Visit: Payer: Self-pay | Admitting: Nurse Practitioner

## 2019-07-12 ENCOUNTER — Ambulatory Visit: Payer: Self-pay | Admitting: Podiatry

## 2019-07-15 ENCOUNTER — Ambulatory Visit: Payer: Self-pay | Admitting: Nurse Practitioner

## 2019-07-18 DIAGNOSIS — G4733 Obstructive sleep apnea (adult) (pediatric): Secondary | ICD-10-CM | POA: Diagnosis not present

## 2019-07-29 ENCOUNTER — Encounter: Payer: Self-pay | Admitting: Family Medicine

## 2019-07-29 ENCOUNTER — Other Ambulatory Visit: Payer: Self-pay

## 2019-07-29 ENCOUNTER — Ambulatory Visit: Payer: BC Managed Care – PPO | Admitting: Family Medicine

## 2019-07-29 VITALS — BP 154/82 | HR 63 | Temp 97.7°F | Ht 60.0 in | Wt 229.0 lb

## 2019-07-29 DIAGNOSIS — Z9989 Dependence on other enabling machines and devices: Secondary | ICD-10-CM | POA: Diagnosis not present

## 2019-07-29 DIAGNOSIS — G4733 Obstructive sleep apnea (adult) (pediatric): Secondary | ICD-10-CM

## 2019-07-29 NOTE — Patient Instructions (Signed)
Continue CPAP nightly and greater than 4 hours each night   Follow up in 1 year, sooner if needed   Sleep Apnea Sleep apnea affects breathing during sleep. It causes breathing to stop for a short time or to become shallow. It can also increase the risk of:  Heart attack.  Stroke.  Being very overweight (obese).  Diabetes.  Heart failure.  Irregular heartbeat. The goal of treatment is to help you breathe normally again. What are the causes? There are three kinds of sleep apnea:  Obstructive sleep apnea. This is caused by a blocked or collapsed airway.  Central sleep apnea. This happens when the brain does not send the right signals to the muscles that control breathing.  Mixed sleep apnea. This is a combination of obstructive and central sleep apnea. The most common cause of this condition is a collapsed or blocked airway. This can happen if:  Your throat muscles are too relaxed.  Your tongue and tonsils are too large.  You are overweight.  Your airway is too small. What increases the risk?  Being overweight.  Smoking.  Having a small airway.  Being older.  Being female.  Drinking alcohol.  Taking medicines to calm yourself (sedatives or tranquilizers).  Having family members with the condition. What are the signs or symptoms?  Trouble staying asleep.  Being sleepy or tired during the day.  Getting angry a lot.  Loud snoring.  Headaches in the morning.  Not being able to focus your mind (concentrate).  Forgetting things.  Less interest in sex.  Mood swings.  Personality changes.  Feelings of sadness (depression).  Waking up a lot during the night to pee (urinate).  Dry mouth.  Sore throat. How is this diagnosed?  Your medical history.  A physical exam.  A test that is done when you are sleeping (sleep study). The test is most often done in a sleep lab but may also be done at home. How is this treated?   Sleeping on your  side.  Using a medicine to get rid of mucus in your nose (decongestant).  Avoiding the use of alcohol, medicines to help you relax, or certain pain medicines (narcotics).  Losing weight, if needed.  Changing your diet.  Not smoking.  Using a machine to open your airway while you sleep, such as: ? An oral appliance. This is a mouthpiece that shifts your lower jaw forward. ? A CPAP device. This device blows air through a mask when you breathe out (exhale). ? An EPAP device. This has valves that you put in each nostril. ? A BPAP device. This device blows air through a mask when you breathe in (inhale) and breathe out.  Having surgery if other treatments do not work. It is important to get treatment for sleep apnea. Without treatment, it can lead to:  High blood pressure.  Coronary artery disease.  In men, not being able to have an erection (impotence).  Reduced thinking ability. Follow these instructions at home: Lifestyle  Make changes that your doctor recommends.  Eat a healthy diet.  Lose weight if needed.  Avoid alcohol, medicines to help you relax, and some pain medicines.  Do not use any products that contain nicotine or tobacco, such as cigarettes, e-cigarettes, and chewing tobacco. If you need help quitting, ask your doctor. General instructions  Take over-the-counter and prescription medicines only as told by your doctor.  If you were given a machine to use while you sleep, use it only   as told by your doctor.  If you are having surgery, make sure to tell your doctor you have sleep apnea. You may need to bring your device with you.  Keep all follow-up visits as told by your doctor. This is important. Contact a doctor if:  The machine that you were given to use during sleep bothers you or does not seem to be working.  You do not get better.  You get worse. Get help right away if:  Your chest hurts.  You have trouble breathing in enough air.  You have  an uncomfortable feeling in your back, arms, or stomach.  You have trouble talking.  One side of your body feels weak.  A part of your face is hanging down. These symptoms may be an emergency. Do not wait to see if the symptoms will go away. Get medical help right away. Call your local emergency services (911 in the [place] Do not drive yourself to the hospital. Summary  This condition affects breathing during sleep.  The most common cause is a collapsed or blocked airway.  The goal of treatment is to help you breathe normally while you sleep. This information is not intended to replace advice given to you by your health care provider. Make sure you discuss any questions you have with your health care provider. Document Revised: 03/16/2018 Document Reviewed: 01/23/2018 Elsevier Patient Education  2020 [employer]  

## 2019-07-29 NOTE — Progress Notes (Addendum)
PATIENT: Amanda Alexander DOB: [birth year]  REASON FOR VISIT: follow up HISTORY FROM: patient  Chief Complaint  Patient presents with  . Follow-up    RM2. alone. States the CPAP works well, and she feels better rested.     HISTORY OF PRESENT ILLNESS: Today 07/29/19 Amanda Alexander is a 59 y.o. female here today for follow up for OSA recently restarted on CPAP therapy. HST revealed mild OSA with total AHI of 14 and O2 nadir of 83%. Autopap was recommended. She reports doing well on CPAP. She feels more refreshed when she wakes. She continues to battle fatigue but feels this is situational. She recently moved homes and feels that she is completely worn out. Overall, she is doing better.   Compliance report dated 06/24/2019 through 07/23/2019 reveals that she used CPAP 29 of the last 30 days for compliance of 97%.  27 of the last 30 days she used CPAP greater than 4 hours for compliance of 90%.  Average usage was 6 hours and 31 minutes.  Residual AHI was 0.5 on 6 to 12 cm of water and an EPR of 3.  There was no significant leak noted.   HISTORY: (copied from Dr Teofilo Pod note on 04/22/2019)  Dear Amanda Alexander,   I saw your patient, [place] Alexander, upon your kind request in my sleep clinic today for initial consultation of her sleep disorder, in particular, re-evaluation of her prior diagnosis of OSA.  The patient is unaccompanied today.  As you know, Ms. Hartinger is a 60 year old right-handed woman with an underlying medical history of diabetes, hypertension, lower extremity edema, reflux disease and obesity, who was previously diagnosed with obstructive sleep apnea and placed on CPAP therapy.  I reviewed your office note from 12/31/2018.  Prior sleep study results are not available for my review today.  She has an older CPAP machine but has not been using it for the past year or 2.  She has misplaced or lost her machine cord.  She was tested about 10 years ago and was able to use CPAP for about 8 or 9 years  even.  She felt she benefited from it.  She would be willing to get retested and get new equipment.  She is worried about implications of untreated sleep apnea with respect damage to the heart.  She reports that her daughter and her son have been diagnosed with sleep apnea.  Her weight has been stable.  Her Epworth sleepiness score is 15 out of 24, fatigue severity score is 27 out of 63.  She works as a Lawyer at [employer].  She goes to bed late, she has trouble falling asleep.  Bedtime is around midnight, rise time between 530 and 7 depending on [name].  She denies recurrent morning headaches and has nocturia about once or twice per average night.  She has a TV in the bedroom but it is currently not working.  She had a tonsillectomy at age 25 secondary to recurrent tonsillitis.  They also trimmed her uvula at the time.  She drinks caffeine in the form of coffee, 2 cups/day on average, non-smoker, drinks alcohol occasionally.  She had restless leg symptoms in the past, none recently.  She lives with her 44 year old daughter, they have no pets at the house.   REVIEW OF SYSTEMS: Out of a complete 14 system review of symptoms, the patient complains only of the following symptoms, none and all other reviewed systems are negative.  ESS: 20/24 FSS: 34/63  **  scores are not improved from initial visit. Patient feels that scores are inflated due to recent move and do not accurately reflect that she is doing better.   ALLERGIES: No Known Allergies  HOME MEDICATIONS: Outpatient Medications Prior to Visit  Medication Sig Dispense Refill  . ACCU-CHEK FASTCLIX LANCETS MISC 1 Container by Does not apply route 2 (two) times daily. 102 each 0  . aspirin EC 81 MG tablet Take 81 mg by mouth daily.    . diclofenac sodium (VOLTAREN) 1 % GEL Apply 2 g topically 4 (four) times daily. 50 g 2  . furosemide (LASIX) 40 MG tablet Take 1 tablet (40 mg total) by mouth daily. 90 tablet 1  . glucose blood (ACCU-CHEK GUIDE) test  strip 1 each by Other route as needed for other. Use as instructed    . glucose blood (ACCU-CHEK SMARTVIEW) test strip 1 each by Other route 2 (two) times daily at 8 am and 10 pm. Use to check blood sugar twice daily at 8am and 10pm. diag code E11.65. noninsulin dependent 100 each 12  . Insulin Pen Needle (SURE COMFORT PEN NEEDLES) 32G X 6 MM MISC USE TO inject EVERY DAY 100 each 2  . lisinopril (ZESTRIL) 40 MG tablet Take 1 tablet (40 mg total) by mouth daily. 90 tablet 1  . metFORMIN (GLUCOPHAGE) 1000 MG tablet TAKE ONE TABLET BY MOUTH TWICE DAILY WITH breakfast AND dinner 180 tablet 1  . nitroGLYCERIN (NITROSTAT) 0.4 MG SL tablet Place 1 tablet (0.4 mg total) under the tongue every 5 (five) minutes as needed for chest pain. 30 tablet 0  . OZEMPIC, 0.25 OR 0.5 MG/DOSE, 2 MG/1.5ML SOPN Inject 0.5 mg into the skin once a week. 3 mL 0  . pantoprazole (PROTONIX) 40 MG tablet Take 1 tablet (40 mg total) by mouth daily. NEED OV. 90 tablet 1  . pravastatin (PRAVACHOL) 20 MG tablet take 1 tablet by oral route every day (Patient taking differently: Take 20 mg by mouth every evening. ) 90 tablet 1  . TOUJEO SOLOSTAR 300 UNIT/ML SOPN 50 Units by Subcutaneous Infusion route daily for 30 days. Inject 50 units sq qhs 1.5 mL 1  . traMADol (ULTRAM) 50 MG tablet Take 1 tablet (50 mg total) by mouth every 6 (six) hours as needed. 30 tablet 0   No facility-administered medications prior to visit.    PAST MEDICAL HISTORY: Past Medical History:  Diagnosis Date  . Diabetes mellitus without complication ([place])   . GERD (gastroesophageal reflux disease) 05/23/2014  . Hypertension   . Lower extremity edema 07/15/2016  . OSA on CPAP     PAST SURGICAL HISTORY: Past Surgical History:  Procedure Laterality Date  . TONSILLECTOMY Bilateral     FAMILY HISTORY: Family History  Problem Relation Age of Onset  . Heart disease Mother   . Diabetes Mother   . Other Father        Polio  . Lung cancer Father   . Breast  cancer Sister   . Arthritis Sister   . Cancer Sister   . COPD Sister   . Diabetes Brother   . Hyperlipidemia Brother   . Other Brother        polio and gun shot death  . Sleep apnea Son     SOCIAL HISTORY: Social History   Socioeconomic History  . Marital status: Widowed    Spouse name: Not on file  . Number of children: Not on file  . Years of education: Not on  file  . Highest education level: Not on file  Occupational History  . Not on file  Tobacco Use  . Smoking status: Never Smoker  . Smokeless tobacco: Never Used  Substance and Sexual Activity  . Alcohol use: No    Alcohol/week: 0.0 standard drinks  . Drug use: No  . Sexual activity: Never  Other Topics Concern  . Not on file  Social History Narrative  . Not on file   Social Determinants of Health   Financial Resource Strain:   . Difficulty of Paying Living Expenses: Not on file  Food Insecurity:   . Worried About Programme researcher, broadcasting/film/video in the Last Year: Not on file  . Ran Out of Food in the Last Year: Not on file  Transportation Needs:   . Lack of Transportation (Medical): Not on file  . Lack of Transportation (Non-Medical): Not on file  Physical Activity:   . Days of Exercise per Week: Not on file  . Minutes of Exercise per Session: Not on file  Stress:   . Feeling of Stress : Not on file  Social Connections:   . Frequency of Communication with Friends and Family: Not on file  . Frequency of Social Gatherings with Friends and Family: Not on file  . Attends Religious Services: Not on file  . Active Member of Clubs or Organizations: Not on file  . Attends Banker Meetings: Not on file  . Marital Status: Not on file  Intimate Partner Violence:   . Fear of Current or Ex-Partner: Not on file  . Emotionally Abused: Not on file  . Physically Abused: Not on file  . Sexually Abused: Not on file      PHYSICAL EXAM  There were no vitals filed for this visit. There is no height or weight on  file to calculate BMI.  Generalized: Well developed, in no acute distress  Cardiology: normal rate and rhythm, no murmur noted Respiratory: clear to auscultation bilaterally  Neurological examination  Mentation: Alert oriented to time, place, history taking. Follows all commands speech and language fluent Cranial nerve II-XII: Pupils were equal round reactive to light. Extraocular movements were full, visual field were full  Motor: The motor testing reveals 5 over 5 strength of all 4 extremities. Good symmetric motor tone is noted throughout.   Gait and station: Gait is normal.   DIAGNOSTIC DATA (LABS, IMAGING, TESTING) - I reviewed patient records, labs, notes, testing and imaging myself where available.  No flowsheet data found.   Lab Results  Component Value Date   WBC 5.7 05/13/2019   HGB 11.7 05/13/2019   HCT 34.9 05/13/2019   MCV 87 05/13/2019   PLT 308 05/13/2019      Component Value Date/Time   NA 142 05/13/2019 1030   K 4.1 05/13/2019 1030   CL 103 05/13/2019 1030   CO2 25 05/13/2019 1030   GLUCOSE 137 (H) 05/13/2019 1030   GLUCOSE 222 (H) 05/11/2019 2020   BUN 13 05/13/2019 1030   CREATININE 0.79 05/13/2019 1030   CREATININE 0.72 08/12/2016 1031   CALCIUM 9.1 05/13/2019 1030   PROT 7.3 05/13/2019 1030   ALBUMIN 4.1 05/13/2019 1030   AST 13 05/13/2019 1030   ALT 16 05/13/2019 1030   ALKPHOS 99 05/13/2019 1030   BILITOT 0.3 05/13/2019 1030   GFRNONAA 82 05/13/2019 1030   GFRAA 94 05/13/2019 1030   Lab Results  Component Value Date   CHOL 143 05/13/2019   HDL 54 05/13/2019  LDLCALC 72 05/13/2019   TRIG 92 05/13/2019   CHOLHDL 2.6 05/13/2019   Lab Results  Component Value Date   HGBA1C 8.0 (H) 05/13/2019   No results found for: VITAMINB12 Lab Results  Component Value Date   TSH 1.700 05/13/2019       ASSESSMENT AND PLAN 59 y.o. year old female  has a past medical history of Diabetes mellitus without complication ([facility]), GERD (gastroesophageal  reflux disease) (05/23/2014), Hypertension, Lower extremity edema (07/15/2016), and OSA on CPAP. here with     ICD-10-CM   1. OSA on CPAP  G47.33    Z99.89     Josephene is feeling much better after restarting CPAP therapy. Although she continues to battle fatigue, she feels that this is related to recent move and hectic work schedule. She does feel that she is sleeping much better and wakes feeling refreshed. Compliance review reveals excellent compliance. She was encouraged to continue using CPAP nightly and greater than 4 hours each night. She was encouraged to continue close follow up with PCP for management of HTN and DM. She feels BP is elevated today due to white coat syndrome. Manual recheck was 148/82. She was advised to monitor readings at home and follow up with PCP for consistent readings > 140/90. She will follow up with [place] in 1 year, sooner if needed. She verbalizes understanding and agreement with this plan.    No orders of the defined types were placed in this encounter.    No orders of the defined types were placed in this encounter.     I spent 15 minutes with the patient. 50% of this time was spent counseling and educating patient on plan of care and medications.    Shawnie Dapper, FNP-C 07/29/2019, 11:18 AM [facility] [address] [place], [place] [address] [phone]  I reviewed the above note and documentation by the Nurse Practitioner and agree with the history, exam, assessment and plan as outlined above. I was available for consultation. Huston Foley, MD, PhD [facility])

## 2019-08-06 ENCOUNTER — Other Ambulatory Visit: Payer: Self-pay | Admitting: Nurse Practitioner

## 2019-08-06 DIAGNOSIS — M545 Low back pain, unspecified: Secondary | ICD-10-CM

## 2019-08-06 DIAGNOSIS — G8929 Other chronic pain: Secondary | ICD-10-CM

## 2019-08-06 MED ORDER — TRAMADOL HCL 50 MG PO TABS: 50.0000 mg | ORAL_TABLET | Freq: Four times a day (QID) | ORAL | 0 refills | Status: DC | PRN

## 2019-08-09 ENCOUNTER — Other Ambulatory Visit: Payer: Self-pay | Admitting: Nurse Practitioner

## 2019-08-12 ENCOUNTER — Other Ambulatory Visit: Payer: Self-pay | Admitting: Internal Medicine

## 2019-08-12 NOTE — Telephone Encounter (Signed)
Patient needs and appointment.

## 2019-08-13 ENCOUNTER — Telehealth: Payer: Self-pay

## 2019-08-13 NOTE — Telephone Encounter (Signed)
I left the pt a message that the office was calling her to schedule her an appt.

## 2019-08-26 ENCOUNTER — Encounter: Payer: Self-pay | Admitting: Nurse Practitioner

## 2019-08-26 ENCOUNTER — Ambulatory Visit: Payer: BC Managed Care – PPO | Admitting: Nurse Practitioner

## 2019-08-26 ENCOUNTER — Other Ambulatory Visit: Payer: Self-pay

## 2019-08-26 VITALS — BP 128/74 | HR 69 | Temp 98.5°F | Ht 60.6 in | Wt 225.4 lb

## 2019-08-26 DIAGNOSIS — R200 Anesthesia of skin: Secondary | ICD-10-CM

## 2019-08-26 DIAGNOSIS — Z6841 Body Mass Index (BMI) 40.0 and over, adult: Secondary | ICD-10-CM

## 2019-08-26 DIAGNOSIS — M545 Low back pain, unspecified: Secondary | ICD-10-CM

## 2019-08-26 DIAGNOSIS — E119 Type 2 diabetes mellitus without complications: Secondary | ICD-10-CM | POA: Diagnosis not present

## 2019-08-26 DIAGNOSIS — G8929 Other chronic pain: Secondary | ICD-10-CM

## 2019-08-26 DIAGNOSIS — R202 Paresthesia of skin: Secondary | ICD-10-CM

## 2019-08-26 DIAGNOSIS — Z79899 Other long term (current) drug therapy: Secondary | ICD-10-CM | POA: Diagnosis not present

## 2019-08-26 DIAGNOSIS — I10 Essential (primary) hypertension: Secondary | ICD-10-CM | POA: Diagnosis not present

## 2019-08-26 MED ORDER — TRAMADOL HCL 50 MG PO TABS: 50.0000 mg | ORAL_TABLET | Freq: Four times a day (QID) | ORAL | 0 refills | Status: DC | PRN

## 2019-08-26 MED ORDER — KETOROLAC TROMETHAMINE 60 MG/2ML IM SOLN
60.0000 mg | Freq: Once | INTRAMUSCULAR | Status: AC
  Administered 2019-08-26: 60 mg via INTRAMUSCULAR

## 2019-08-26 MED ORDER — OZEMPIC (0.25 OR 0.5 MG/DOSE) 2 MG/1.5ML ~~LOC~~ SOPN: 0.5000 mg | PEN_INJECTOR | SUBCUTANEOUS | 1 refills | Status: DC

## 2019-08-26 MED ORDER — OZEMPIC (0.25 OR 0.5 MG/DOSE) 2 MG/1.5ML ~~LOC~~ SOPN: 0.5000 mL | PEN_INJECTOR | SUBCUTANEOUS | 1 refills | Status: DC

## 2019-08-26 NOTE — Patient Instructions (Signed)
Sciatica Rehab °Ask your health care provider which exercises are safe for you. Do exercises exactly as told by your health care provider and adjust them as directed. It is normal to feel mild stretching, pulling, tightness, or discomfort as you do these exercises. Stop right away if you feel sudden pain or your pain gets worse. Do not begin these exercises until told by your health care provider. °Stretching and range-of-motion exercises °These exercises warm up your muscles and joints and improve the movement and flexibility of your hips and back. These exercises also help to relieve pain, numbness, and tingling. °Sciatic nerve glide °1. Sit in a chair with your head facing down toward your chest. Place your hands behind your back. Let your shoulders slump forward. °2. Slowly straighten one of your legs while you tilt your head back as if you are looking toward the ceiling. Only straighten your leg as far as you can without making your symptoms worse. °3. Hold this position for __________ seconds. °4. Slowly return to the starting position. °5. Repeat with your other leg. °Repeat __________ times. Complete this exercise __________ times a day. °Knee to chest with hip adduction and internal rotation ° °1. Lie on your back on a firm surface with both legs straight. °2. Bend one of your knees and move it up toward your chest until you feel a gentle stretch in your lower back and buttock. Then, move your knee toward the shoulder that is on the opposite side from your leg. This is hip adduction and internal rotation. °? Hold your leg in this position by holding on to the front of your knee. °3. Hold this position for __________ seconds. °4. Slowly return to the starting position. °5. Repeat with your other leg. °Repeat __________ times. Complete this exercise __________ times a day. °Prone extension on elbows ° °1. Lie on your abdomen on a firm surface. A bed may be too soft for this exercise. °2. Prop yourself up on  your elbows. °3. Use your arms to help lift your chest up until you feel a gentle stretch in your abdomen and your lower back. °? This will place some of your body weight on your elbows. If this is uncomfortable, try stacking pillows under your chest. °? Your hips should stay down, against the surface that you are lying on. Keep your hip and back muscles relaxed. °4. Hold this position for __________ seconds. °5. Slowly relax your upper body and return to the starting position. °Repeat __________ times. Complete this exercise __________ times a day. °Strengthening exercises °These exercises build strength and endurance in your back. Endurance is the ability to use your muscles for a long time, even after they get tired. °Pelvic tilt °This exercise strengthens the muscles that lie deep in the abdomen. °1. Lie on your back on a firm surface. Bend your knees and keep your feet flat on the floor. °2. Tense your abdominal muscles. Tip your pelvis up toward the ceiling and flatten your lower back into the floor. °? To help with this exercise, you may place a small towel under your lower back and try to push your back into the towel. °3. Hold this position for __________ seconds. °4. Let your muscles relax completely before you repeat this exercise. °Repeat __________ times. Complete this exercise __________ times a day. °Alternating arm and leg raises ° °1. Get on your hands and knees on a firm surface. If you are on a hard floor, you may want to use   padding, such as an exercise mat, to cushion your knees. °2. Line up your arms and legs. Your hands should be directly below your shoulders, and your knees should be directly below your hips. °3. Lift your left leg behind you. At the same time, raise your right arm and straighten it in front of you. °? Do not lift your leg higher than your hip. °? Do not lift your arm higher than your shoulder. °? Keep your abdominal and back muscles tight. °? Keep your hips facing the  ground. °? Do not arch your back. °? Keep your balance carefully, and do not hold your breath. °4. Hold this position for __________ seconds. °5. Slowly return to the starting position. °6. Repeat with your right leg and your left arm. °Repeat __________ times. Complete this exercise __________ times a day. °Posture and body mechanics °Good posture and healthy body mechanics can help to relieve stress in your body's tissues and joints. Body mechanics refers to the movements and positions of your body while you do your daily activities. Posture is part of body mechanics. Good posture means: °· Your spine is in its natural S-curve position (neutral). °· Your shoulders are pulled back slightly. °· Your head is not tipped forward. °Follow these guidelines to improve your posture and body mechanics in your everyday activities. °Standing ° °· When standing, keep your spine neutral and your feet about hip width apart. Keep a slight bend in your knees. Your ears, shoulders, and hips should line up. °· When you do a task in which you stand in one place for a long time, place one foot up on a stable object that is 2-4 inches (5-10 cm) high, such as a footstool. This helps keep your spine neutral. °Sitting ° °· When sitting, keep your spine neutral and keep your feet flat on the floor. Use a footrest, if necessary, and keep your thighs parallel to the floor. Avoid rounding your shoulders, and avoid tilting your head forward. °· When working at a desk or a computer, keep your desk at a height where your hands are slightly lower than your elbows. Slide your chair under your desk so you are close enough to maintain good posture. °· When working at a computer, place your monitor at a height where you are looking straight ahead and you do not have to tilt your head forward or downward to look at the screen. °Resting °· When lying down and resting, avoid positions that are most painful for you. °· If you have pain with activities  such as sitting, bending, stooping, or squatting, lie in a position in which your body does not bend very much. For example, avoid curling up on your side with your arms and knees near your chest (fetal position). °· If you have pain with activities such as standing for a long time or reaching with your arms, lie with your spine in a neutral position and bend your knees slightly. Try the following positions: °? Lying on your side with a pillow between your knees. °? Lying on your back with a pillow under your knees. °Lifting ° °· When lifting objects, keep your feet at least shoulder width apart and tighten your abdominal muscles. °· Bend your knees and hips and keep your spine neutral. It is important to lift using the strength of your legs, not your back. Do not lock your knees straight out. °· Always ask for help to lift heavy or awkward objects. °This information is not   intended to replace advice given to you by your health care provider. Make sure you discuss any questions you have with your health care provider. °Document Revised: 09/21/2018 Document Reviewed: 06/21/2018 °Elsevier Patient Education © 2020 [employer] ° °Low Back Sprain or Strain Rehab °Ask your health care provider which exercises are safe for you. Do exercises exactly as told by your health care provider and adjust them as directed. It is normal to feel mild stretching, pulling, tightness, or discomfort as you do these exercises. Stop right away if you feel sudden pain or your pain gets worse. Do not begin these exercises until told by your health care provider. °Stretching and range-of-motion exercises °These exercises warm up your muscles and joints and improve the movement and flexibility of your back. These exercises also help to relieve pain, numbness, and tingling. °Lumbar rotation ° °6. Lie on your back on a firm surface and bend your knees. °7. Straighten your arms out to your Diprima so each arm forms a 90-degree angle (right angle)  with a side of your body. °8. Slowly move (rotate) both of your knees to one side of your body until you feel a stretch in your lower back (lumbar). Try not to let your shoulders lift off the floor. °9. Hold this position for __________ seconds. °10. Tense your abdominal muscles and slowly move your knees back to the starting position. °11. Repeat this exercise on the other side of your body. °Repeat __________ times. Complete this exercise __________ times a day. °Single knee to chest ° °6. Lie on your back on a firm surface with both legs straight. °7. Bend one of your knees. Use your hands to move your knee up toward your chest until you feel a gentle stretch in your lower back and buttock. °? Hold your leg in this position by holding on to the front of your knee. °? Keep your other leg as straight as possible. °8. Hold this position for __________ seconds. °9. Slowly return to the starting position. °10. Repeat with your other leg. °Repeat __________ times. Complete this exercise __________ times a day. °Prone extension on elbows ° °6. Lie on your abdomen on a firm surface (prone position). °7. Prop yourself up on your elbows. °8. Use your arms to help lift your chest up until you feel a gentle stretch in your abdomen and your lower back. °? This will place some of your body weight on your elbows. If this is uncomfortable, try stacking pillows under your chest. °? Your hips should stay down, against the surface that you are lying on. Keep your hip and back muscles relaxed. °9. Hold this position for __________ seconds. °10. Slowly relax your upper body and return to the starting position. °Repeat __________ times. Complete this exercise __________ times a day. °Strengthening exercises °These exercises build strength and endurance in your back. Endurance is the ability to use your muscles for a long time, even after they get tired. °Pelvic tilt °This exercise strengthens the muscles that lie deep in the  abdomen. °5. Lie on your back on a firm surface. Bend your knees and keep your feet flat on the floor. °6. Tense your abdominal muscles. Tip your pelvis up toward the ceiling and flatten your lower back into the floor. °? To help with this exercise, you may place a small towel under your lower back and try to push your back into the towel. °7. Hold this position for __________ seconds. °8. Let your muscles relax completely   before you repeat this exercise. °Repeat __________ times. Complete this exercise __________ times a day. °Alternating arm and leg raises ° °7. Get on your hands and knees on a firm surface. If you are on a hard floor, you may want to use padding, such as an exercise mat, to cushion your knees. °8. Line up your arms and legs. Your hands should be directly below your shoulders, and your knees should be directly below your hips. °9. Lift your left leg behind you. At the same time, raise your right arm and straighten it in front of you. °? Do not lift your leg higher than your hip. °? Do not lift your arm higher than your shoulder. °? Keep your abdominal and back muscles tight. °? Keep your hips facing the ground. °? Do not arch your back. °? Keep your balance carefully, and do not hold your breath. °10. Hold this position for __________ seconds. °11. Slowly return to the starting position. °12. Repeat with your right leg and your left arm. °Repeat __________ times. Complete this exercise __________ times a day. °Abdominal set with straight leg raise ° °1. Lie on your back on a firm surface. °2. Bend one of your knees and keep your other leg straight. °3. Tense your abdominal muscles and lift your straight leg up, 4-6 inches (10-15 cm) off the ground. °4. Keep your abdominal muscles tight and hold this position for __________ seconds. °? Do not hold your breath. °? Do not arch your back. Keep it flat against the ground. °5. Keep your abdominal muscles tense as you slowly lower your leg back to the  starting position. °6. Repeat with your other leg. °Repeat __________ times. Complete this exercise __________ times a day. °Single leg lower with bent knees °1. Lie on your back on a firm surface. °2. Tense your abdominal muscles and lift your feet off the floor, one foot at a time, so your knees and hips are bent in 90-degree angles (right angles). °? Your knees should be over your hips and your lower legs should be parallel to the floor. °3. Keeping your abdominal muscles tense and your knee bent, slowly lower one of your legs so your toe touches the ground. °4. Lift your leg back up to return to the starting position. °? Do not hold your breath. °? Do not let your back arch. Keep your back flat against the ground. °5. Repeat with your other leg. °Repeat __________ times. Complete this exercise __________ times a day. °Posture and body mechanics °Good posture and healthy body mechanics can help to relieve stress in your body's tissues and joints. Body mechanics refers to the movements and positions of your body while you do your daily activities. Posture is part of body mechanics. Good posture means: °· Your spine is in its natural S-curve position (neutral). °· Your shoulders are pulled back slightly. °· Your head is not tipped forward. °Follow these guidelines to improve your posture and body mechanics in your everyday activities. °Standing ° °· When standing, keep your spine neutral and your feet about hip width apart. Keep a slight bend in your knees. Your ears, shoulders, and hips should line up. °· When you do a task in which you stand in one place for a long time, place one foot up on a stable object that is 2-4 inches (5-10 cm) high, such as a footstool. This helps keep your spine neutral. °Sitting ° °· When sitting, keep your spine neutral and keep your feet   flat on the floor. Use a footrest, if necessary, and keep your thighs parallel to the floor. Avoid rounding your shoulders, and avoid tilting your  head forward. °· When working at a desk or a computer, keep your desk at a height where your hands are slightly lower than your elbows. Slide your chair under your desk so you are close enough to maintain good posture. °· When working at a computer, place your monitor at a height where you are looking straight ahead and you do not have to tilt your head forward or downward to look at the screen. °Resting °· When lying down and resting, avoid positions that are most painful for you. °· If you have pain with activities such as sitting, bending, stooping, or squatting, lie in a position in which your body does not bend very much. For example, avoid curling up on your side with your arms and knees near your chest (fetal position). °· If you have pain with activities such as standing for a long time or reaching with your arms, lie with your spine in a neutral position and bend your knees slightly. Try the following positions: °? Lying on your side with a pillow between your knees. °? Lying on your back with a pillow under your knees. °Lifting ° °· When lifting objects, keep your feet at least shoulder width apart and tighten your abdominal muscles. °· Bend your knees and hips and keep your spine neutral. It is important to lift using the strength of your legs, not your back. Do not lock your knees straight out. °· Always ask for help to lift heavy or awkward objects. °This information is not intended to replace advice given to you by your health care provider. Make sure you discuss any questions you have with your health care provider. °Document Revised: 09/21/2018 Document Reviewed: 06/21/2018 °Elsevier Patient Education © 2020 [employer] ° °

## 2019-08-26 NOTE — Progress Notes (Signed)
This visit occurred during the SARS-CoV-2 public health emergency.  Safety protocols were in place, including screening questions prior to the visit, additional usage of staff PPE, and extensive cleaning of exam room while observing appropriate contact time as indicated for disinfecting solutions.  Subjective:     Patient ID: Amanda Alexander , female    DOB: 1961-05-08 , 59 y.o.   MRN: [id number]   Chief Complaint  Patient presents with  . Diabetes    HPI  Wt Readings from Last 3 Encounters: 08/26/19 : 225 lb 6.4 oz (102.2 kg) 07/29/19 : 229 lb (103.9 kg) 05/31/19 : 231 lb 12.8 oz (105.1 kg)  She is not interested in getting the covid  Diabetes She presents for her follow-up diabetic visit. She has type 2 diabetes mellitus. Her disease course has been stable. Pertinent negatives for hypoglycemia include no confusion, dizziness or headaches. Pertinent negatives for diabetes include no blurred vision, no chest pain, no polydipsia, no polyphagia and no polyuria. There are no hypoglycemic complications. There are no diabetic complications. Risk factors for coronary artery disease include sedentary lifestyle and obesity. Current diabetic treatment includes oral agent (dual therapy). She is compliant with treatment all of the time. She is following a generally unhealthy diet. When asked about meal planning, she reported none. She rarely participates in exercise. (She has not been checking her blood sugar regularly) An ACE inhibitor/angiotensin II receptor blocker is being taken. She does not see a podiatrist.Eye exam is not current.  Back Pain This is a chronic (feels like having a flare since last month after moving) problem. The current episode started more than 1 year ago. The quality of the pain is described as aching. The pain does not radiate. Pertinent negatives include no chest pain or headaches. Risk factors include sedentary lifestyle.     Past Medical History:  Diagnosis Date  .  Diabetes mellitus without complication ([place])   . GERD (gastroesophageal reflux disease) 05/23/2014  . Hypertension   . Lower extremity edema 07/15/2016  . OSA on CPAP      Family History  Problem Relation Age of Onset  . Heart disease Mother   . Diabetes Mother   . Other Father        Polio  . Lung cancer Father   . Breast cancer Sister   . Arthritis Sister   . Cancer Sister   . COPD Sister   . Diabetes Brother   . Hyperlipidemia Brother   . Other Brother        polio and gun shot death  . Sleep apnea Son      Current Outpatient Medications:  .  ACCU-CHEK FASTCLIX LANCETS MISC, 1 Container by Does not apply route 2 (two) times daily., Disp: 102 each, Rfl: 0 .  aspirin EC 81 MG tablet, Take 81 mg by mouth daily., Disp: , Rfl:  .  diclofenac sodium (VOLTAREN) 1 % GEL, Apply 2 g topically 4 (four) times daily., Disp: 50 g, Rfl: 2 .  furosemide (LASIX) 40 MG tablet, Take 1 tablet (40 mg total) by mouth daily., Disp: 90 tablet, Rfl: 1 .  glucose blood (ACCU-CHEK GUIDE) test strip, 1 each by Other route as needed for other. Use as instructed, Disp: , Rfl:  .  glucose blood (ACCU-CHEK SMARTVIEW) test strip, 1 each by Other route 2 (two) times daily at 8 am and 10 pm. Use to check blood sugar twice daily at 8am and 10pm. diag code E11.65. noninsulin dependent, Disp: 100  each, Rfl: 12 .  Insulin Pen Needle (SURE COMFORT PEN NEEDLES) 32G X 6 MM MISC, USE TO inject EVERY DAY, Disp: 100 each, Rfl: 2 .  lisinopril (ZESTRIL) 40 MG tablet, Take 1 tablet (40 mg total) by mouth daily., Disp: 90 tablet, Rfl: 1 .  metFORMIN (GLUCOPHAGE) 1000 MG tablet, TAKE ONE TABLET BY MOUTH TWICE DAILY WITH breakfast AND dinner, Disp: 180 tablet, Rfl: 1 .  nitroGLYCERIN (NITROSTAT) 0.4 MG SL tablet, Place 1 tablet (0.4 mg total) under the tongue every 5 (five) minutes as needed for chest pain., Disp: 30 tablet, Rfl: 0 .  OZEMPIC, 0.25 OR 0.5 MG/DOSE, 2 MG/1.5ML SOPN, Inject 0.5 mg into the skin once a week., Disp:  3 mL, Rfl: 0 .  pantoprazole (PROTONIX) 40 MG tablet, Take 1 tablet (40 mg total) by mouth daily. NEED OV., Disp: 90 tablet, Rfl: 1 .  pravastatin (PRAVACHOL) 20 MG tablet, take 1 tablet by oral route every day (Patient taking differently: Take 20 mg by mouth every evening. ), Disp: 90 tablet, Rfl: 1 .  TOUJEO SOLOSTAR 300 UNIT/ML SOPN, 50 Units by Subcutaneous Infusion route daily for 30 days. Inject 50 units sq qhs, Disp: 4.5 mL, Rfl: 1 .  traMADol (ULTRAM) 50 MG tablet, Take 1 tablet (50 mg total) by mouth every 6 (six) hours as needed., Disp: 30 tablet, Rfl: 0   No Known Allergies   Review of Systems  Constitutional: Negative.   Eyes: Negative for blurred vision.  Respiratory: Negative.   Cardiovascular: Negative for chest pain, palpitations and leg swelling.  Endocrine: Negative for polydipsia, polyphagia and polyuria.  Musculoskeletal: Positive for back pain.  Neurological: Negative for dizziness and headaches.  Psychiatric/Behavioral: Negative.  Negative for confusion.     Today's Vitals   08/26/19 1526  BP: 128/74  Pulse: 69  Temp: 98.5 F (36.9 C)  TempSrc: Oral  SpO2: 98%  Weight: 225 lb 6.4 oz (102.2 kg)  Height: 5' 0.6" (1.539 m)   Body mass index is 43.15 kg/m.   Objective:  Physical Exam Vitals reviewed.  Constitutional:      General: She is not in acute distress.    Appearance: Normal appearance. She is obese.  Cardiovascular:     Rate and Rhythm: Normal rate and regular rhythm.     Pulses: Normal pulses.     Heart sounds: Normal heart sounds. No murmur.  Pulmonary:     Effort: Pulmonary effort is normal. No respiratory distress.     Breath sounds: Normal breath sounds.  Musculoskeletal:        General: Tenderness present. Normal range of motion.  Skin:    Capillary Refill: Capillary refill takes less than 2 seconds.  Neurological:     General: No focal deficit present.     Mental Status: She is alert and oriented to person, place, and time.      Cranial Nerves: No cranial nerve deficit.  Psychiatric:        Mood and Affect: Mood normal.        Behavior: Behavior normal.        Thought Content: Thought content normal.        Judgment: Judgment normal.         Assessment And Plan:     1. Type 2 diabetes mellitus without complication, unspecified whether long term insulin use (HCC)  Chronic, she has not been checking her blood sugars plans to go and get her supplies today  Will check HgbA1c - Semaglutide,0.25 or  0.5MG/DOS, (OZEMPIC, 0.25 OR 0.5 MG/DOSE,) 2 MG/1.5ML SOPN; Inject 0.5 mLs into the skin once a week.  Dispense: 9 mL; Refill: 1 - Lipid panel - Hemoglobin A1c  2. Essential hypertension  Chronic  Continue with current medications - CMP14+EGFR  3. Morbid obesity with BMI of 40.0-44.9, adult ([place])  Chronic   Encouraged to increase activity as tolerated  4. Chronic bilateral low back pain without sciatica  Will treat with toradol - ketorolac (TORADOL) injection 60 mg  5. Numbness and tingling in right hand  Negative phalen's and tinel's  Will check metabolic cause   If normal and continues to have pain will consider referring to a hand specialist - Vitamin B12 - TSH - VITAMIN D 25 Hydroxy (Vit-D Deficiency, Fractures)   Minette Brine, FNP    THE PATIENT IS ENCOURAGED TO PRACTICE SOCIAL DISTANCING DUE TO THE COVID-19 PANDEMIC.

## 2019-08-27 LAB — HEMOGLOBIN A1C
Est. average glucose Bld gHb Est-mCnc: 186 mg/dL
Hgb A1c MFr Bld: 8.1 % — ABNORMAL HIGH (ref 4.8–5.6)

## 2019-08-27 LAB — LIPID PANEL
Chol/HDL Ratio: 2.7 ratio (ref 0.0–4.4)
Cholesterol, Total: 140 mg/dL (ref 100–199)
HDL: 52 mg/dL (ref >39)
LDL Chol Calc (NIH): 66 mg/dL (ref 0–99)
Triglycerides: 124 mg/dL (ref 0–149)
VLDL Cholesterol Cal: 22 mg/dL (ref 5–40)

## 2019-08-27 LAB — DRUG SCREEN, URINE
Amphetamines, Urine: NEGATIVE ng/mL
Barbiturate screen, urine: NEGATIVE ng/mL
Benzodiazepine Quant, Ur: NEGATIVE ng/mL
Cannabinoid Quant, Ur: NEGATIVE ng/mL
Cocaine (Metab.): NEGATIVE ng/mL
Opiate Quant, Ur: NEGATIVE ng/mL
PCP Quant, Ur: NEGATIVE ng/mL

## 2019-08-27 LAB — CMP14+EGFR
ALT: 25 IU/L (ref 0–32)
AST: 25 IU/L (ref 0–40)
Albumin/Globulin Ratio: 1.6 (ref 1.2–2.2)
Albumin: 4.1 g/dL (ref 3.8–4.8)
Alkaline Phosphatase: 93 IU/L (ref 39–117)
BUN/Creatinine Ratio: 25 (ref 12–28)
BUN: 22 mg/dL (ref 8–27)
Bilirubin Total: 0.3 mg/dL (ref 0.0–1.2)
CO2: 26 mmol/L (ref 20–29)
Calcium: 9.2 mg/dL (ref 8.7–10.3)
Chloride: 98 mmol/L (ref 96–106)
Creatinine, Ser: 0.88 mg/dL (ref 0.57–1.00)
GFR calc Af Amer: 82 mL/min/{1.73_m2} (ref >59)
GFR calc non Af Amer: 71 mL/min/{1.73_m2} (ref >59)
Globulin, Total: 2.6 g/dL (ref 1.5–4.5)
Glucose: 100 mg/dL — ABNORMAL HIGH (ref 65–99)
Potassium: 4.2 mmol/L (ref 3.5–5.2)
Sodium: 139 mmol/L (ref 134–144)
Total Protein: 6.7 g/dL (ref 6.0–8.5)

## 2019-08-27 LAB — VITAMIN D 25 HYDROXY (VIT D DEFICIENCY, FRACTURES): Vit D, 25-Hydroxy: 23.3 ng/mL — ABNORMAL LOW (ref 30.0–100.0)

## 2019-08-27 LAB — VITAMIN B12: Vitamin B-12: 290 pg/mL (ref 232–1245)

## 2019-08-27 LAB — TSH: TSH: 1.02 u[IU]/mL (ref 0.450–4.500)

## 2019-09-15 DIAGNOSIS — G4733 Obstructive sleep apnea (adult) (pediatric): Secondary | ICD-10-CM | POA: Diagnosis not present

## 2019-09-30 ENCOUNTER — Other Ambulatory Visit: Payer: Self-pay | Admitting: Internal Medicine

## 2019-10-04 DIAGNOSIS — G4733 Obstructive sleep apnea (adult) (pediatric): Secondary | ICD-10-CM | POA: Diagnosis not present

## 2019-10-15 DIAGNOSIS — G4733 Obstructive sleep apnea (adult) (pediatric): Secondary | ICD-10-CM | POA: Diagnosis not present

## 2019-11-15 ENCOUNTER — Other Ambulatory Visit: Payer: Self-pay | Admitting: Nurse Practitioner

## 2019-11-15 DIAGNOSIS — G4733 Obstructive sleep apnea (adult) (pediatric): Secondary | ICD-10-CM | POA: Diagnosis not present

## 2019-11-16 ENCOUNTER — Other Ambulatory Visit: Payer: Self-pay | Admitting: Internal Medicine

## 2019-11-18 ENCOUNTER — Ambulatory Visit: Payer: BC Managed Care – PPO | Admitting: Internal Medicine

## 2019-11-18 ENCOUNTER — Other Ambulatory Visit: Payer: Self-pay

## 2019-11-18 ENCOUNTER — Encounter: Payer: Self-pay | Admitting: Internal Medicine

## 2019-11-18 VITALS — BP 126/78 | HR 69 | Temp 98.0°F | Ht 60.6 in | Wt 229.8 lb

## 2019-11-18 DIAGNOSIS — I10 Essential (primary) hypertension: Secondary | ICD-10-CM

## 2019-11-18 DIAGNOSIS — Z6841 Body Mass Index (BMI) 40.0 and over, adult: Secondary | ICD-10-CM

## 2019-11-18 DIAGNOSIS — M5416 Radiculopathy, lumbar region: Secondary | ICD-10-CM

## 2019-11-18 DIAGNOSIS — I739 Peripheral vascular disease, unspecified: Secondary | ICD-10-CM | POA: Insufficient documentation

## 2019-11-18 DIAGNOSIS — E119 Type 2 diabetes mellitus without complications: Secondary | ICD-10-CM | POA: Diagnosis not present

## 2019-11-18 MED ORDER — TRAMADOL HCL 50 MG PO TABS: 50.0000 mg | ORAL_TABLET | Freq: Four times a day (QID) | ORAL | 0 refills | Status: AC | PRN

## 2019-11-18 NOTE — Progress Notes (Signed)
This visit occurred during the SARS-CoV-2 public health emergency.  Safety protocols were in place, including screening questions prior to the visit, additional usage of staff PPE, and extensive cleaning of exam room while observing appropriate contact time as indicated for disinfecting solutions.  Subjective:     Patient ID: Amanda Alexander , female    DOB: [birth year] , 59 y.o.   MRN: [id number]   Chief Complaint  Patient presents with  . Hypertension  . Diabetes  . Medication Refill    tramadol    HPI  She is here today for BP/DM check. She reports compliance with meds. She is most concerned about back pain. She reports her sx have worsened over the past several weeks. She has pain that radiates down both legs. Described as sharp, shooting. At times can be dull,throbbing.  She has pain after being seated or standing for long periods of time. She denies LE weakness. She would like refill of tramadol.   Hypertension This is a chronic problem. The current episode started more than 1 year ago. The problem has been gradually improving since onset. Pertinent negatives include no blurred vision, chest pain, headaches, palpitations or shortness of breath. Past treatments include ACE inhibitors and diuretics. The current treatment provides moderate improvement. Compliance problems include exercise.   Diabetes She presents for her follow-up diabetic visit. She has type 2 diabetes mellitus. There are no hypoglycemic associated symptoms. Pertinent negatives for hypoglycemia include no headaches. Pertinent negatives for diabetes include no blurred vision and no chest pain. There are no hypoglycemic complications. Risk factors for coronary artery disease include diabetes mellitus, hypertension, post-menopausal, sedentary lifestyle, dyslipidemia and obesity.  Medication Refill This is a chronic problem. Pertinent negatives include no abdominal pain, chest pain, fever or headaches. The symptoms are  aggravated by exertion and standing.     Past Medical History:  Diagnosis Date  . Diabetes mellitus without complication ([place])   . GERD (gastroesophageal reflux disease) 05/23/2014  . Hypertension   . Lower extremity edema 07/15/2016  . OSA on CPAP      Family History  Problem Relation Age of Onset  . Heart disease Mother   . Diabetes Mother   . Other Father        Polio  . Lung cancer Father   . Breast cancer Sister   . Arthritis Sister   . Cancer Sister   . COPD Sister   . Diabetes Brother   . Hyperlipidemia Brother   . Other Brother        polio and gun shot death  . Sleep apnea Son      Current Outpatient Medications:  .  ACCU-CHEK FASTCLIX LANCETS MISC, 1 Container by Does not apply route 2 (two) times daily., Disp: 102 each, Rfl: 0 .  aspirin EC 81 MG tablet, Take 81 mg by mouth daily., Disp: , Rfl:  .  diclofenac sodium (VOLTAREN) 1 % GEL, Apply 2 g topically 4 (four) times daily., Disp: 50 g, Rfl: 2 .  furosemide (LASIX) 40 MG tablet, Take 1 tablet (40 mg total) by mouth daily., Disp: 90 tablet, Rfl: 1 .  glucose blood (ACCU-CHEK GUIDE) test strip, 1 each by Other route as needed for other. Use as instructed, Disp: , Rfl:  .  metFORMIN (GLUCOPHAGE) 1000 MG tablet, TAKE ONE TABLET BY MOUTH TWICE DAILY WITH breakfast AND dinner, Disp: 180 tablet, Rfl: 1 .  nitroGLYCERIN (NITROSTAT) 0.4 MG SL tablet, Place 1 tablet (0.4 mg total) under the tongue  every 5 (five) minutes as needed for chest pain., Disp: 30 tablet, Rfl: 0 .  pantoprazole (PROTONIX) 40 MG tablet, Take 1 tablet (40 mg total) by mouth daily. NEED OV., Disp: 90 tablet, Rfl: 1 .  pravastatin (PRAVACHOL) 20 MG tablet, take 1 tablet by oral route every day (Patient taking differently: Take 20 mg by mouth every evening. ), Disp: 90 tablet, Rfl: 1 .  Semaglutide,0.25 or 0.5MG/DOS, (OZEMPIC, 0.25 OR 0.5 MG/DOSE,) 2 MG/1.5ML SOPN, Inject 0.5 mg into the skin once a week., Disp: 9 mL, Rfl: 1 .  SURE COMFORT PEN NEEDLES  32G X 6 MM MISC, USE TO inject EVERY DAY, Disp: 100 each, Rfl: 2 .  TOUJEO SOLOSTAR 300 UNIT/ML Solostar Pen, 50 Units by Subcutaneous Infusion route daily for 30 days. Inject 50 units sq qhs, Disp: 4.5 mL, Rfl: 1 .  traMADol (ULTRAM) 50 MG tablet, Take 1 tablet (50 mg total) by mouth every 6 (six) hours as needed., Disp: 30 tablet, Rfl: 0 .  lisinopril (ZESTRIL) 40 MG tablet, Take 1 tablet (40 mg total) by mouth daily., Disp: 90 tablet, Rfl: 1   No Known Allergies   Review of Systems  Constitutional: Negative.  Negative for fever.  Eyes: Negative for blurred vision.  Respiratory: Negative.  Negative for shortness of breath.   Cardiovascular: Negative.  Negative for chest pain and palpitations.  Gastrointestinal: Negative.  Negative for abdominal pain.  Musculoskeletal: Positive for back pain.  Neurological: Negative.  Negative for headaches.  Psychiatric/Behavioral: Negative.      Today's Vitals   11/18/19 1416  BP: 126/78  Pulse: 69  Temp: 98 F (36.7 C)  TempSrc: Oral  Weight: 229 lb 12.8 oz (104.2 kg)  Height: 5' 0.6" (1.539 m)   Body mass index is 44 kg/m.   Objective:  Physical Exam Vitals and nursing note reviewed.  Constitutional:      Appearance: Normal appearance. She is obese.  HENT:     Head: Normocephalic and atraumatic.  Cardiovascular:     Rate and Rhythm: Normal rate and regular rhythm.     Heart sounds: Normal heart sounds.  Pulmonary:     Effort: Pulmonary effort is normal.     Breath sounds: Normal breath sounds.  Musculoskeletal:     Comments: Pos straight leg test on the right. No spinal tenderness.   Skin:    General: Skin is warm.  Neurological:     General: No focal deficit present.     Mental Status: She is alert.  Psychiatric:        Mood and Affect: Mood normal.        Behavior: Behavior normal.         Assessment And Plan:     1. Essential hypertension  Chronic, well controlled. She will continue with current meds. She is  encouraged to avoid adding salt to her foods.   2. Type 2 diabetes mellitus without complication, unspecified whether long term insulin use (HCC)  Chronic, I will check labs as listed below. Importance of dietary and medication compliance was discussed with the patient.   - BMP8+EGFR - Hemoglobin A1c  3. Lumbar radiculopathy  Chronic, she was given refill of tramadol. Review of the [place] CSRS was performed in accordance of the [place] prior to dispensing any controlled drugs. I will also refer her for MRI lumbar spine to further evaluate her back pain.   - MR Lumbar Spine Wo Contrast; Future - traMADol (ULTRAM) 50 MG tablet; Take 1 tablet (  50 mg total) by mouth every 6 (six) hours as needed.  Dispense: 30 tablet; Refill: 0  4. Claudication (HCC)  Chronic, yet stable. Importance of regular exercise and statin compliance was discussed with the patient.   5. Morbid obesity with BMI of 40.0-44.9, adult (HCC)  BMI 44. She is encouraged to strive for BMI less than 39 to decrease cardiac risk. She is advised to work up to 30 minutes five days per week.     Maximino Greenland, MD    THE PATIENT IS ENCOURAGED TO PRACTICE SOCIAL DISTANCING DUE TO THE COVID-19 PANDEMIC.

## 2019-11-18 NOTE — Patient Instructions (Signed)

## 2019-11-19 LAB — BMP8+EGFR
BUN/Creatinine Ratio: 20 (ref 12–28)
BUN: 17 mg/dL (ref 8–27)
CO2: 25 mmol/L (ref 20–29)
Calcium: 9 mg/dL (ref 8.7–10.3)
Chloride: 104 mmol/L (ref 96–106)
Creatinine, Ser: 0.84 mg/dL (ref 0.57–1.00)
GFR calc Af Amer: 87 mL/min/{1.73_m2} (ref >59)
GFR calc non Af Amer: 75 mL/min/{1.73_m2} (ref >59)
Glucose: 136 mg/dL — ABNORMAL HIGH (ref 65–99)
Potassium: 4.3 mmol/L (ref 3.5–5.2)
Sodium: 142 mmol/L (ref 134–144)

## 2019-11-19 LAB — HEMOGLOBIN A1C
Est. average glucose Bld gHb Est-mCnc: 180 mg/dL
Hgb A1c MFr Bld: 7.9 % — ABNORMAL HIGH (ref 4.8–5.6)

## 2019-11-26 ENCOUNTER — Ambulatory Visit: Payer: BC Managed Care – PPO | Admitting: Internal Medicine

## 2019-12-02 ENCOUNTER — Telehealth: Payer: Self-pay

## 2019-12-02 ENCOUNTER — Encounter: Payer: Self-pay | Admitting: Podiatry

## 2019-12-02 ENCOUNTER — Other Ambulatory Visit: Payer: Self-pay

## 2019-12-02 ENCOUNTER — Ambulatory Visit (INDEPENDENT_AMBULATORY_CARE_PROVIDER_SITE_OTHER): Payer: BC Managed Care – PPO | Admitting: Podiatry

## 2019-12-02 VITALS — BP 142/80 | HR 67

## 2019-12-02 DIAGNOSIS — L84 Corns and callosities: Secondary | ICD-10-CM | POA: Diagnosis not present

## 2019-12-02 DIAGNOSIS — B351 Tinea unguium: Secondary | ICD-10-CM

## 2019-12-02 DIAGNOSIS — R600 Localized edema: Secondary | ICD-10-CM

## 2019-12-02 DIAGNOSIS — M79674 Pain in right toe(s): Secondary | ICD-10-CM

## 2019-12-02 DIAGNOSIS — M2041 Other hammer toe(s) (acquired), right foot: Secondary | ICD-10-CM | POA: Diagnosis not present

## 2019-12-02 DIAGNOSIS — E119 Type 2 diabetes mellitus without complications: Secondary | ICD-10-CM

## 2019-12-02 DIAGNOSIS — M79675 Pain in left toe(s): Secondary | ICD-10-CM | POA: Diagnosis not present

## 2019-12-02 DIAGNOSIS — M2042 Other hammer toe(s) (acquired), left foot: Secondary | ICD-10-CM

## 2019-12-02 MED ORDER — NONFORMULARY OR COMPOUNDED ITEM: 3 refills | Status: AC

## 2019-12-02 NOTE — Telephone Encounter (Signed)
The pt was notified dr sanders said to try coricidan hbp to help with her head cold and that her sample of ozempic is ready for pickup.

## 2019-12-02 NOTE — Telephone Encounter (Signed)
I left a detailed message with the pt's most recent lab results.

## 2019-12-02 NOTE — Patient Instructions (Addendum)
[facility] [phone] Antifungal nail solution   Corns and Calluses Corns are small areas of thickened skin that occur on the top, [place], or tip of a toe. They contain a cone-shaped core with a point that can press on a nerve below. This causes pain.  Calluses are areas of thickened skin that can occur anywhere on the body, including the hands, fingers, palms, soles of the feet, and heels. Calluses are usually larger than corns. What are the causes? Corns and calluses are caused by rubbing (friction) or pressure, such as from shoes that are too tight or do not fit properly. What increases the risk? Corns are more likely to develop in people who have misshapen toes (toe deformities), such as hammer toes. Calluses can occur with friction to any area of the skin. They are more likely to develop in people who:  Work with their hands.  Wear shoes that fit poorly, are too tight, or are high-heeled.  Have toe deformities. What are the signs or symptoms? Symptoms of a corn or callus include:  A hard growth on the skin.  Pain or tenderness under the skin.  Redness and swelling.  Increased discomfort while wearing tight-fitting shoes, if your feet are affected. If a corn or callus becomes infected, symptoms may include:  Redness and swelling that gets worse.  Pain.  Fluid, blood, or pus draining from the corn or callus. How is this diagnosed? Corns and calluses may be diagnosed based on your symptoms, your medical history, and a physical exam. How is this treated? Treatment for corns and calluses may include:  Removing the cause of the friction or pressure. This may involve: ? Changing your shoes. ? Wearing shoe inserts (orthotics) or other protective layers in your shoes, such as a corn pad. ? Wearing gloves.  Applying medicine to the skin (topical medicine) to help soften skin in the hardened, thickened areas.  Removing layers of dead skin with a file to reduce the  size of the corn or callus.  Removing the corn or callus with a scalpel or laser.  Taking antibiotic medicines, if your corn or callus is infected.  Having surgery, if a toe deformity is the cause. Follow these instructions at home:   Take over-the-counter and prescription medicines only as told by your health care provider.  If you were prescribed an antibiotic, take it as told by your health care provider. Do not stop taking it even if your condition starts to improve.  Wear shoes that fit well. Avoid wearing high-heeled shoes and shoes that are too tight or too loose.  Wear any padding, protective layers, gloves, or orthotics as told by your health care provider.  Soak your hands or feet and then use a file or pumice stone to soften your corn or callus. Do this as told by your health care provider.  Check your corn or callus every day for symptoms of infection. Contact a health care provider if you:  Notice that your symptoms do not improve with treatment.  Have redness or swelling that gets worse.  Notice that your corn or callus becomes painful.  Have fluid, blood, or pus coming from your corn or callus.  Have new symptoms. Summary  Corns are small areas of thickened skin that occur on the top, [place], or tip of a toe.  Calluses are areas of thickened skin that can occur anywhere on the body, including the hands, fingers, palms, and soles of the feet. Calluses are usually larger than  corns.  Corns and calluses are caused by rubbing (friction) or pressure, such as from shoes that are too tight or do not fit properly.  Treatment may include wearing any padding, protective layers, gloves, or orthotics as told by your health care provider. This information is not intended to replace advice given to you by your health care provider. Make sure you discuss any questions you have with your health care provider. Document Revised: 09/19/2018 Document Reviewed: 04/12/2017 Elsevier  Patient Education  [place].   Diabetes Mellitus and [place] care is an important part of your health, especially when you have diabetes. Diabetes may cause you to have problems because of poor blood flow (circulation) to your feet and legs, which can cause your skin to:  Become thinner and drier.  Break more easily.  Heal more slowly.  Peel and crack. You may also have nerve damage (neuropathy) in your legs and feet, causing decreased feeling in them. This means that you may not notice minor injuries to your feet that could lead to more serious problems. Noticing and addressing any potential problems early is the best way to prevent future foot problems. How to care for your feet Foot hygiene  Wash your feet daily with warm water and mild soap. Do not use hot water. Then, pat your feet and the areas between your toes until they are completely dry. Do not soak your feet as this can dry your skin.  Trim your toenails straight across. Do not dig under them or around the cuticle. File the edges of your nails with an emery board or nail file.  Apply a moisturizing lotion or petroleum jelly to the skin on your feet and to dry, brittle toenails. Use lotion that does not contain alcohol and is unscented. Do not apply lotion between your toes. Shoes and socks  Wear clean socks or stockings every day. Make sure they are not too tight. Do not wear knee-high stockings since they may decrease blood flow to your legs.  Wear shoes that fit properly and have enough cushioning. Always look in your shoes before you put them on to be sure there are no objects inside.  To break in new shoes, wear them for just a few hours a day. This prevents injuries on your feet. Wounds, scrapes, corns, and calluses  Check your feet daily for blisters, cuts, bruises, sores, and redness. If you cannot see the bottom of your feet, use a mirror or ask someone for help.  Do not cut corns or calluses or  try to remove them with medicine.  If you find a minor scrape, cut, or break in the skin on your feet, keep it and the skin around it clean and dry. You may clean these areas with mild soap and water. Do not clean the area with peroxide, alcohol, or iodine.  If you have a wound, scrape, corn, or callus on your foot, look at it several times a day to make sure it is healing and not infected. Check for: ? Redness, swelling, or pain. ? Fluid or blood. ? Warmth. ? Pus or a bad smell. General instructions  Do not cross your legs. This may decrease blood flow to your feet.  Do not use heating pads or hot water bottles on your feet. They may burn your skin. If you have lost feeling in your feet or legs, you may not know this is happening until it is too late.  Protect your feet from  hot and cold by wearing shoes, such as at the beach or on hot pavement.  Schedule a complete foot exam at least once a year (annually) or more often if you have foot problems. If you have foot problems, report any cuts, sores, or bruises to your health care provider immediately. Contact a health care provider if:  You have a medical condition that increases your risk of infection and you have any cuts, sores, or bruises on your feet.  You have an injury that is not healing.  You have redness on your legs or feet.  You feel burning or tingling in your legs or feet.  You have pain or cramps in your legs and feet.  Your legs or feet are numb.  Your feet always feel cold.  You have pain around a toenail. Get help right away if:  You have a wound, scrape, corn, or callus on your foot and: ? You have pain, swelling, or redness that gets worse. ? You have fluid or blood coming from the wound, scrape, corn, or callus. ? Your wound, scrape, corn, or callus feels warm to the touch. ? You have pus or a bad smell coming from the wound, scrape, corn, or callus. ? You have a fever. ? You have a red line going up  your leg. Summary  Check your feet every day for cuts, sores, red spots, swelling, and blisters.  Moisturize feet and legs daily.  Wear shoes that fit properly and have enough cushioning.  If you have foot problems, report any cuts, sores, or bruises to your health care provider immediately.  Schedule a complete foot exam at least once a year (annually) or more often if you have foot problems. This information is not intended to replace advice given to you by your health care provider. Make sure you discuss any questions you have with your health care provider. Document Revised: 02/20/2019 Document Reviewed: 07/01/2016 Elsevier Patient Education  [place].

## 2019-12-02 NOTE — Telephone Encounter (Signed)
Left vm for pt to return call for lab results  

## 2019-12-02 NOTE — Telephone Encounter (Signed)
-----   Message from Dorothyann Peng, MD sent at 12/01/2019  9:00 PM EDT ----- Kidney fxn is stable. Your hba1c is 7.9, down from 8.1. Very good! Our goal is less than 7.4, you are almost there!

## 2019-12-03 ENCOUNTER — Encounter: Payer: Self-pay | Admitting: Podiatry

## 2019-12-03 ENCOUNTER — Telehealth: Payer: Self-pay

## 2019-12-03 NOTE — Telephone Encounter (Signed)
Left vm for pt to return call for labs  

## 2019-12-03 NOTE — Progress Notes (Signed)
Subjective: Amanda Alexander presents today referred by Dorothyann Peng, MD for diabetic foot evaluation.  Patient relates >10 year history of diabetes.  Patient denies any history of foot wounds.  Patient denies any history of numbness, tingling, burning, pins/needles sensations.  Today, patient c/o of painful, discolored, thick toenails and painful right 5th digit corn which interfere with daily activities.  Pain is aggravated when wearing enclosed shoe gear.   Past Medical History:  Diagnosis Date  . Diabetes mellitus without complication (HCC)   . GERD (gastroesophageal reflux disease) 05/23/2014  . Hypertension   . Lower extremity edema 07/15/2016  . OSA on CPAP     Patient Active Problem List   Diagnosis Date Noted  . Claudication (HCC) 11/18/2019  . Morbid obesity with BMI of 40.0-44.9, adult (HCC) 08/26/2019  . Acute left-sided low back pain with left-sided sciatica 09/17/2018  . Lower extremity edema 07/15/2016  . GERD (gastroesophageal reflux disease) 05/23/2014  . Type 2 diabetes mellitus without complication (HCC) 05/23/2014  . HTN (hypertension) 04/26/2013  . OSA on CPAP 04/26/2013  . Healthcare maintenance 04/26/2013    Past Surgical History:  Procedure Laterality Date  . TONSILLECTOMY Bilateral     Current Outpatient Medications on File Prior to Visit  Medication Sig Dispense Refill  . ACCU-CHEK FASTCLIX LANCETS MISC 1 Container by Does not apply route 2 (two) times daily. 102 each 0  . aspirin EC 81 MG tablet Take 81 mg by mouth daily.    . diclofenac sodium (VOLTAREN) 1 % GEL Apply 2 g topically 4 (four) times daily. 50 g 2  . furosemide (LASIX) 40 MG tablet Take 1 tablet (40 mg total) by mouth daily. 90 tablet 1  . glucose blood (ACCU-CHEK GUIDE) test strip 1 each by Other route as needed for other. Use as instructed    . lisinopril (ZESTRIL) 40 MG tablet Take 1 tablet (40 mg total) by mouth daily. 90 tablet 1  . metFORMIN (GLUCOPHAGE) 1000 MG tablet TAKE  ONE TABLET BY MOUTH TWICE DAILY WITH breakfast AND dinner 180 tablet 1  . nitroGLYCERIN (NITROSTAT) 0.4 MG SL tablet Place 1 tablet (0.4 mg total) under the tongue every 5 (five) minutes as needed for chest pain. 30 tablet 0  . pantoprazole (PROTONIX) 40 MG tablet Take 1 tablet (40 mg total) by mouth daily. NEED OV. 90 tablet 1  . pravastatin (PRAVACHOL) 20 MG tablet take 1 tablet by oral route every day (Patient taking differently: Take 20 mg by mouth every evening. ) 90 tablet 1  . Semaglutide,0.25 or 0.5MG /DOS, (OZEMPIC, 0.25 OR 0.5 MG/DOSE,) 2 MG/1.5ML SOPN Inject 0.5 mg into the skin once a week. 9 mL 1  . SURE COMFORT PEN NEEDLES 32G X 6 MM MISC USE TO inject EVERY DAY 100 each 2  . TOUJEO SOLOSTAR 300 UNIT/ML Solostar Pen 50 Units by Subcutaneous Infusion route daily for 30 days. Inject 50 units sq qhs 4.5 mL 1  . traMADol (ULTRAM) 50 MG tablet Take 1 tablet (50 mg total) by mouth every 6 (six) hours as needed. 30 tablet 0   No current facility-administered medications on file prior to visit.     No Known Allergies  Social History   Occupational History  . Not on file  Tobacco Use  . Smoking status: Never Smoker  . Smokeless tobacco: Never Used  Substance and Sexual Activity  . Alcohol use: Yes    Alcohol/week: 0.0 standard drinks    Comment: Has a glass of wine on  occasion.  . Drug use: No  . Sexual activity: Never    Family History  Problem Relation Age of Onset  . Heart disease Mother   . Diabetes Mother   . Other Father        Polio  . Lung cancer Father   . Breast cancer Sister   . Arthritis Sister   . Cancer Sister   . COPD Sister   . Diabetes Brother   . Hyperlipidemia Brother   . Other Brother        polio and gun shot death  . Sleep apnea Son     Immunization History  Administered Date(s) Administered  . Influenza-Unspecified 04/07/2014, 03/16/2018, 04/09/2019  . Pneumococcal Polysaccharide-23 04/26/2013  . Zoster Recombinat (Shingrix) 05/13/2019     Review of systems: Positive Findings in bold print.  Constitutional:  chills, fatigue, fever, sweats, weight change Communication: Optometrist, sign Ecologist, hand writing, iPad/Android device Head: headaches, head injury Eyes: changes in vision, eye pain, glaucoma, cataracts, macular degeneration, diplopia, glare,  light sensitivity, eyeglasses or contacts, blindness Ears nose mouth throat: hearing impaired, hearing aids,  ringing in ears, deaf, sign language,  vertigo, nosebleeds,  rhinitis,  cold sores, snoring, swollen glands Cardiovascular: HTN, edema, arrhythmia, pacemaker in place, defibrillator in place, chest pain/tightness, chronic anticoagulation, blood clot, heart failure, MI Peripheral Vascular: leg cramps, varicose veins, blood clots, lymphedema, varicosities Respiratory:  difficulty breathing, denies congestion, SOB, wheezing, cough, emphysema Gastrointestinal: change in appetite or weight, abdominal pain, constipation, diarrhea, nausea, vomiting, vomiting blood, change in bowel habits, abdominal pain, jaundice, rectal bleeding, hemorrhoids, GERD Genitourinary:  nocturia,  pain on urination, polyuria,  blood in urine, Foley catheter, urinary urgency, ESRD on hemodialysis Musculoskeletal: amputation, cramping, stiff joints, painful joints, decreased joint motion, fractures, OA, gout, hemiplegia, paraplegia, uses cane, wheelchair bound, uses walker, uses rollator Skin: +changes in toenails, color change, dryness, itching, mole changes,  rash, wound(s) Neurological: headaches, numbness in feet, paresthesias in feet, burning in feet, fainting,  seizures, change in speech,  headaches, memory problems/poor historian, cerebral palsy, weakness, paralysis, CVA, TIA Endocrine: diabetes, hypothyroidism, hyperthyroidism,  goiter, dry mouth, flushing, heat intolerance,  cold intolerance,  excessive thirst, denies polyuria,  nocturia Hematological:  easy bleeding, excessive bleeding,  easy bruising, enlarged lymph nodes, on long term blood thinner, history of past transusions Allergy/immunological:  hives, eczema, frequent infections, multiple drug allergies, seasonal allergies, transplant recipient, multiple food allergies Psychiatric:  anxiety, depression, mood disorder, suicidal ideations, hallucinations, insomnia  Objective: Vitals:   12/02/19 1605  BP: (!) 142/80  Pulse: 75    Marathon is a/an 59 y.o. female obese in NAD.Marland Kitchen AAO X 3.  Vascular Examination: Capillary refill time to digits immediate b/l. Palpable pedal pulses b/l LE. Pedal hair sparse. Lower extremity skin temperature gradient within normal limits. Nonpitting edema noted left ankle and right ankle.  Dermatological Examination: Pedal skin with normal turgor, texture and tone bilaterally. No open wounds bilaterally. No interdigital macerations bilaterally. Toenails 1-5 b/l elongated, discolored, dystrophic, thickened, crumbly with subungual debris and tenderness to dorsal palpation. Hyperkeratotic lesion(s) R 5th toe.  No erythema, no edema, no drainage, no flocculence.  Musculoskeletal Examination: Normal muscle strength 5/5 to all lower extremity muscle groups bilaterally. No pain crepitus or joint limitation noted with ROM b/l. Hammertoes noted to the b/l lower extremities, L 5th toe and R 5th toe. Pes planus deformity noted b/l.   Footwear Assessment: Does the patient wear appropriate shoes? No. Does the patient need inserts/orthotics? No.  Neurological Examination: Protective sensation intact 5/5 intact bilaterally with 10g monofilament b/l. Vibratory sensation intact b/l. Proprioception intact bilaterally. Deep tendon reflexes normal b/l.  Babinski reflex negative b/l. Clonus negative b/l.  Hemoglobin A1C Latest Ref Rng & Units 11/18/2019 08/26/2019 05/13/2019 12/31/2018  HGBA1C 4.8 - 5.6 % 7.9(H) 8.1(H) 8.0(H) 7.4(H)  Some recent data might be hidden   Assessment: 1. Pain due to  onychomycosis of toenails of both feet   2. Corns   3. Acquired hammertoes of both feet   4. Localized edema   5. Type 2 diabetes mellitus without complication, without long-term current use of insulin (HCC)   6. Encounter for diabetic foot exam (HCC)      ADA Risk Categorization:  Low Risk:  Patient has all of the following: Intact protective sensation No prior foot ulcer  No severe deformity Pedal pulses present  Plan: -Examined patient. -Diabetic foot examination performed on today's visit. -Discussed topical, laser and oral medication. Patient opted for topical treatment with compounded medication. Rx written for nonformulary compounding topical antifungal: [facility]: Antifungal cream - Terbinafine 3%, Fluconazole 2%, Tea Tree Oil 5%, Urea 10%, Ibuprofen 2% in DMSO Suspension #57ml. Apply to the affected nail(s) at bedtime. -Toenails 1-5 b/l were debrided in length and girth with sterile nail nippers and dremel without iatrogenic bleeding.  -Corn(s) R 5th toe pared utilizing sterile scalpel blade without complication or incident. Total number debrided=1. -Patient to report any pedal injuries to medical professional immediately. -Patient to continue soft, supportive shoe gear daily. Start procedure for diabetic shoes. Patient qualifies based on diagnoses. -Patient to continue soft, supportive shoe gear daily. -Patient/POA to call should there be question/concern in the interim.  Return in about 3 months (around 03/03/2020) for diabetic nail and callus trim.

## 2019-12-03 NOTE — Telephone Encounter (Signed)
-----   Message from Robyn Sanders, MD sent at 12/01/2019  9:00 PM EDT ----- °Kidney fxn is stable. Your hba1c is 7.9, down from 8.1. Very good! Our goal is less than 7.4, you are almost there!  °

## 2019-12-15 DIAGNOSIS — G4733 Obstructive sleep apnea (adult) (pediatric): Secondary | ICD-10-CM | POA: Diagnosis not present

## 2019-12-18 ENCOUNTER — Other Ambulatory Visit: Payer: Self-pay

## 2019-12-18 MED ORDER — PRAVASTATIN SODIUM 20 MG PO TABS: 20.0000 mg | ORAL_TABLET | Freq: Every evening | ORAL | 0 refills | Status: DC

## 2019-12-20 ENCOUNTER — Other Ambulatory Visit: Payer: Self-pay | Admitting: Internal Medicine

## 2019-12-20 DIAGNOSIS — Z1231 Encounter for screening mammogram for malignant neoplasm of breast: Secondary | ICD-10-CM

## 2019-12-23 ENCOUNTER — Other Ambulatory Visit: Payer: BC Managed Care – PPO | Admitting: Orthotics

## 2019-12-26 ENCOUNTER — Other Ambulatory Visit: Payer: Self-pay

## 2019-12-26 ENCOUNTER — Other Ambulatory Visit: Payer: Self-pay | Admitting: Nurse Practitioner

## 2019-12-26 DIAGNOSIS — E119 Type 2 diabetes mellitus without complications: Secondary | ICD-10-CM

## 2019-12-26 MED ORDER — TOUJEO SOLOSTAR 300 UNIT/ML ~~LOC~~ SOPN: PEN_INJECTOR | SUBCUTANEOUS | 1 refills | Status: DC

## 2019-12-26 MED ORDER — OZEMPIC (0.25 OR 0.5 MG/DOSE) 2 MG/1.5ML ~~LOC~~ SOPN: 0.5000 mg | PEN_INJECTOR | SUBCUTANEOUS | 1 refills | Status: DC

## 2019-12-27 ENCOUNTER — Ambulatory Visit
Admission: RE | Admit: 2019-12-27 | Discharge: 2019-12-27 | Disposition: A | Discharge status: Home / Self Care | Payer: BC Managed Care – PPO | Source: Ambulatory Visit | Attending: Internal Medicine | Admitting: Internal Medicine

## 2019-12-27 ENCOUNTER — Other Ambulatory Visit: Payer: Self-pay

## 2019-12-27 DIAGNOSIS — Z1231 Encounter for screening mammogram for malignant neoplasm of breast: Secondary | ICD-10-CM | POA: Diagnosis not present

## 2020-01-03 DIAGNOSIS — G4733 Obstructive sleep apnea (adult) (pediatric): Secondary | ICD-10-CM | POA: Diagnosis not present

## 2020-01-10 ENCOUNTER — Other Ambulatory Visit: Payer: BC Managed Care – PPO | Admitting: Orthotics

## 2020-01-10 ENCOUNTER — Other Ambulatory Visit: Payer: Self-pay

## 2020-01-13 ENCOUNTER — Telehealth: Payer: Self-pay

## 2020-01-13 NOTE — Telephone Encounter (Signed)
The pt wanted to know if she could have a tramadol refill because of her back pain.  The pt was told that the office doesn't treat chronic pain. The pt said yes she would like a referral to a pain clinic and wants to know if she can get a refill while she is waiting on the referral.

## 2020-01-13 NOTE — Telephone Encounter (Signed)
Please send referral to Dr. Metta Clines.

## 2020-01-14 ENCOUNTER — Other Ambulatory Visit: Payer: Self-pay

## 2020-01-14 ENCOUNTER — Telehealth: Payer: Self-pay | Admitting: Podiatry

## 2020-01-14 DIAGNOSIS — G8929 Other chronic pain: Secondary | ICD-10-CM

## 2020-01-14 NOTE — Telephone Encounter (Signed)
Left message for pt to call me back to discuss orthotics coverage. Pt was measured for diabetic shoes/inserts but her insurance [employer] will not cover them. They do cover orthotics @ 70% after deductible.

## 2020-01-15 DIAGNOSIS — G4733 Obstructive sleep apnea (adult) (pediatric): Secondary | ICD-10-CM | POA: Diagnosis not present

## 2020-01-27 ENCOUNTER — Other Ambulatory Visit: Payer: Self-pay

## 2020-01-27 DIAGNOSIS — E119 Type 2 diabetes mellitus without complications: Secondary | ICD-10-CM

## 2020-01-27 MED ORDER — OZEMPIC (0.25 OR 0.5 MG/DOSE) 2 MG/1.5ML ~~LOC~~ SOPN: 0.5000 mg | PEN_INJECTOR | SUBCUTANEOUS | 1 refills | Status: DC

## 2020-01-27 MED ORDER — TOUJEO SOLOSTAR 300 UNIT/ML ~~LOC~~ SOPN: PEN_INJECTOR | SUBCUTANEOUS | 1 refills | Status: DC

## 2020-01-28 ENCOUNTER — Telehealth: Payer: Self-pay

## 2020-01-28 ENCOUNTER — Telehealth: Payer: Self-pay | Admitting: Podiatry

## 2020-01-28 NOTE — Telephone Encounter (Signed)
Left message for pt to call if she is wanting to proceed with ordering the orthotics (Covered @ 70% after deductible(not met) but diabetic shoes and inserts are not covered.Marland KitchenMarland Kitchen

## 2020-01-28 NOTE — Telephone Encounter (Signed)
The pt called and wanted to know who called her. I told the patient that it was an appt reminder for sept 7th, the pt said that has to cancel the appt because it's on a Tuesday and that she will have to call back to reschedule.

## 2020-01-28 NOTE — Telephone Encounter (Signed)
PT LVM WANTING TO VERIFY APPT NO ANS LVM W/APPT DETAILS

## 2020-02-15 DIAGNOSIS — G4733 Obstructive sleep apnea (adult) (pediatric): Secondary | ICD-10-CM | POA: Diagnosis not present

## 2020-02-18 ENCOUNTER — Ambulatory Visit: Payer: Self-pay | Admitting: Internal Medicine

## 2020-02-21 ENCOUNTER — Other Ambulatory Visit: Payer: Self-pay | Admitting: Internal Medicine

## 2020-03-04 ENCOUNTER — Other Ambulatory Visit: Payer: Self-pay | Admitting: Internal Medicine

## 2020-03-04 ENCOUNTER — Other Ambulatory Visit: Payer: Self-pay | Admitting: Nurse Practitioner

## 2020-03-04 DIAGNOSIS — K219 Gastro-esophageal reflux disease without esophagitis: Secondary | ICD-10-CM

## 2020-03-09 ENCOUNTER — Other Ambulatory Visit: Payer: Self-pay | Admitting: Nurse Practitioner

## 2020-03-09 ENCOUNTER — Telehealth: Payer: Self-pay | Admitting: Podiatry

## 2020-03-09 ENCOUNTER — Ambulatory Visit: Payer: BC Managed Care – PPO | Admitting: Internal Medicine

## 2020-03-09 NOTE — Telephone Encounter (Signed)
Pt left message checking on shoes that were ordered by pt..  I returned call and notified pt I had left 2 messages for pt that the diabetic shoes/inserts are not covered by insurance but orthotics are 438.00 but only after meeting deductible (3500.00) and she had not met so the cost would go towards her deductible. I explained we had not ordered them because I was waiting on pt to return my call. She is going to hold off on them at this time.

## 2020-03-10 ENCOUNTER — Ambulatory Visit: Payer: BC Managed Care – PPO | Admitting: Internal Medicine

## 2020-03-13 ENCOUNTER — Ambulatory Visit: Payer: BC Managed Care – PPO | Admitting: Podiatry

## 2020-03-16 DIAGNOSIS — G4733 Obstructive sleep apnea (adult) (pediatric): Secondary | ICD-10-CM | POA: Diagnosis not present

## 2020-03-23 ENCOUNTER — Encounter: Payer: Self-pay | Admitting: Internal Medicine

## 2020-03-23 ENCOUNTER — Ambulatory Visit: Payer: BC Managed Care – PPO | Admitting: Internal Medicine

## 2020-03-23 ENCOUNTER — Telehealth: Payer: Self-pay

## 2020-03-23 ENCOUNTER — Other Ambulatory Visit: Payer: Self-pay

## 2020-03-23 VITALS — BP 126/70 | HR 82 | Temp 98.0°F | Ht 60.6 in | Wt 225.0 lb

## 2020-03-23 DIAGNOSIS — I10 Essential (primary) hypertension: Secondary | ICD-10-CM

## 2020-03-23 DIAGNOSIS — E1165 Type 2 diabetes mellitus with hyperglycemia: Secondary | ICD-10-CM | POA: Diagnosis not present

## 2020-03-23 DIAGNOSIS — E66813 Obesity, class 3: Secondary | ICD-10-CM

## 2020-03-23 DIAGNOSIS — Z2821 Immunization not carried out because of patient refusal: Secondary | ICD-10-CM

## 2020-03-23 DIAGNOSIS — Z79899 Other long term (current) drug therapy: Secondary | ICD-10-CM

## 2020-03-23 DIAGNOSIS — Z23 Encounter for immunization: Secondary | ICD-10-CM

## 2020-03-23 DIAGNOSIS — Z6841 Body Mass Index (BMI) 40.0 and over, adult: Secondary | ICD-10-CM

## 2020-03-23 NOTE — Telephone Encounter (Signed)
Patients PCP placed a referral for allergy to polyethylene glycol. Patient is wanting COVID Component testing. Can someone call and go over the patients history before scheduling?  Thanks

## 2020-03-23 NOTE — Telephone Encounter (Signed)
The pt was notified that the office received her records transfer request from [facility] and please allow 7 - 10 business days.

## 2020-03-23 NOTE — Patient Instructions (Signed)
Diabetes Mellitus and Foot Care Foot care is an important part of your health, especially when you have diabetes. Diabetes may cause you to have problems because of poor blood flow (circulation) to your feet and legs, which can cause your skin to:  Become thinner and drier.  Break more easily.  Heal more slowly.  Peel and crack. You may also have nerve damage (neuropathy) in your legs and feet, causing decreased feeling in them. This means that you may not notice minor injuries to your feet that could lead to more serious problems. Noticing and addressing any potential problems early is the best way to prevent future foot problems. How to care for your feet Foot hygiene  Wash your feet daily with warm water and mild soap. Do not use hot water. Then, pat your feet and the areas between your toes until they are completely dry. Do not soak your feet as this can dry your skin.  Trim your toenails straight across. Do not dig under them or around the cuticle. File the edges of your nails with an emery board or nail file.  Apply a moisturizing lotion or petroleum jelly to the skin on your feet and to dry, brittle toenails. Use lotion that does not contain alcohol and is unscented. Do not apply lotion between your toes. Shoes and socks  Wear clean socks or stockings every day. Make sure they are not too tight. Do not wear knee-high stockings since they may decrease blood flow to your legs.  Wear shoes that fit properly and have enough cushioning. Always look in your shoes before you put them on to be sure there are no objects inside.  To break in new shoes, wear them for just a few hours a day. This prevents injuries on your feet. Wounds, scrapes, corns, and calluses  Check your feet daily for blisters, cuts, bruises, sores, and redness. If you cannot see the bottom of your feet, use a mirror or ask someone for help.  Do not cut corns or calluses or try to remove them with medicine.  If you  find a minor scrape, cut, or break in the skin on your feet, keep it and the skin around it clean and dry. You may clean these areas with mild soap and water. Do not clean the area with peroxide, alcohol, or iodine.  If you have a wound, scrape, corn, or callus on your foot, look at it several times a day to make sure it is healing and not infected. Check for: ? Redness, swelling, or pain. ? Fluid or blood. ? Warmth. ? Pus or a bad smell. General instructions  Do not cross your legs. This may decrease blood flow to your feet.  Do not use heating pads or hot water bottles on your feet. They may burn your skin. If you have lost feeling in your feet or legs, you may not know this is happening until it is too late.  Protect your feet from hot and cold by wearing shoes, such as at the beach or on hot pavement.  Schedule a complete foot exam at least once a year (annually) or more often if you have foot problems. If you have foot problems, report any cuts, sores, or bruises to your health care provider immediately. Contact a health care provider if:  You have a medical condition that increases your risk of infection and you have any cuts, sores, or bruises on your feet.  You have an injury that is not   healing.  You have redness on your legs or feet.  You feel burning or tingling in your legs or feet.  You have pain or cramps in your legs and feet.  Your legs or feet are numb.  Your feet always feel cold.  You have pain around a toenail. Get help right away if:  You have a wound, scrape, corn, or callus on your foot and: ? You have pain, swelling, or redness that gets worse. ? You have fluid or blood coming from the wound, scrape, corn, or callus. ? Your wound, scrape, corn, or callus feels warm to the touch. ? You have pus or a bad smell coming from the wound, scrape, corn, or callus. ? You have a fever. ? You have a red line going up your leg. Summary  Check your feet every day  for cuts, sores, red spots, swelling, and blisters.  Moisturize feet and legs daily.  Wear shoes that fit properly and have enough cushioning.  If you have foot problems, report any cuts, sores, or bruises to your health care provider immediately.  Schedule a complete foot exam at least once a year (annually) or more often if you have foot problems. This information is not intended to replace advice given to you by your health care provider. Make sure you discuss any questions you have with your health care provider. Document Revised: 02/20/2019 Document Reviewed: 07/01/2016 Elsevier Patient Education  2020 Elsevier Inc.  

## 2020-03-23 NOTE — Progress Notes (Signed)
I,[name],acting as a Education administrator for Maximino Greenland, MD.,have documented all relevant documentation on the behalf of Maximino Greenland, MD,as directed by  Maximino Greenland, MD while in the presence of Maximino Greenland, MD.  This visit occurred during the SARS-CoV-2 public health emergency.  Safety protocols were in place, including screening questions prior to the visit, additional usage of staff PPE, and extensive cleaning of exam room while observing appropriate contact time as indicated for disinfecting solutions.  Subjective:     Patient ID: Amanda Alexander , female    DOB: Nov 15, 1960 , 59 y.o.   MRN: [id number]   Chief Complaint  Patient presents with  . Diabetes  . Hypertension    HPI  She is here today for a diabetes and blood pressure follow-up. She reports compliance with meds. Denies headaches, chest pain and shortness of breath.   Hypertension This is a chronic problem. The current episode started more than 1 year ago. The problem has been gradually improving since onset. Pertinent negatives include no blurred vision or shortness of breath. Past treatments include ACE inhibitors and diuretics. The current treatment provides moderate improvement. Compliance problems include exercise.   Diabetes She presents for her follow-up diabetic visit. She has type 2 diabetes mellitus. There are no hypoglycemic associated symptoms. Pertinent negatives for diabetes include no blurred vision. There are no hypoglycemic complications. Risk factors for coronary artery disease include diabetes mellitus, hypertension, post-menopausal, sedentary lifestyle, dyslipidemia and obesity.     Past Medical History:  Diagnosis Date  . Diabetes mellitus without complication ([place])   . GERD (gastroesophageal reflux disease) 05/23/2014  . Hypertension   . Lower extremity edema 07/15/2016  . OSA on CPAP      Family History  Problem Relation Age of Onset  . Heart disease Mother   . Diabetes Mother   .  Other Father        Polio  . Lung cancer Father   . Breast cancer Sister   . Arthritis Sister   . Cancer Sister   . COPD Sister   . Diabetes Brother   . Hyperlipidemia Brother   . Other Brother        polio and gun shot death  . Sleep apnea Son      Current Outpatient Medications:  .  aspirin EC 81 MG tablet, Take 81 mg by mouth daily., Disp: , Rfl:  .  diclofenac sodium (VOLTAREN) 1 % GEL, Apply 2 g topically 4 (four) times daily., Disp: 50 g, Rfl: 2 .  furosemide (LASIX) 40 MG tablet, Take 1 tablet (40 mg total) by mouth daily., Disp: 90 tablet, Rfl: 1 .  glucose blood (ACCU-CHEK GUIDE) test strip, 1 each by Other route as needed for other. Use as instructed, Disp: , Rfl:  .  insulin glargine, 1 Unit Dial, (TOUJEO SOLOSTAR) 300 UNIT/ML Solostar Pen, INJECT 50 Units by Subcutaneous Infusion route daily FOR 30 DAYS, Disp: 4.5 mL, Rfl: 1 .  lisinopril (ZESTRIL) 40 MG tablet, Take 1 tablet (40 mg total) by mouth daily., Disp: 90 tablet, Rfl: 1 .  metFORMIN (GLUCOPHAGE) 1000 MG tablet, TAKE ONE TABLET BY MOUTH TWICE DAILY WITH breakfast AND dinner, Disp: 180 tablet, Rfl: 1 .  nitroGLYCERIN (NITROSTAT) 0.4 MG SL tablet, Place 1 tablet (0.4 mg total) under the tongue every 5 (five) minutes as needed for chest pain., Disp: 30 tablet, Rfl: 0 .  NONFORMULARY OR COMPOUNDED ITEM, Antifungal solution: Terbinafine 3%, Fluconazole 2%, Tea Tree Oil 5%,  Urea 10%, Ibuprofen 2% in DMSO suspension #54m, Disp: 1 each, Rfl: 3 .  pantoprazole (PROTONIX) 40 MG tablet, Take 1 tablet (40 mg total) by mouth daily. NEED OV., Disp: 90 tablet, Rfl: 1 .  pravastatin (PRAVACHOL) 20 MG tablet, Take 1 tablet (20 mg total) by mouth every evening., Disp: 90 tablet, Rfl: 0 .  Semaglutide,0.25 or 0.5MG/DOS, (OZEMPIC, 0.25 OR 0.5 MG/DOSE,) 2 MG/1.5ML SOPN, Inject 0.375 mLs (0.5 mg total) into the skin once a week. (Patient taking differently: Inject 1 mg into the skin once a week. ), Disp: 9 mL, Rfl: 1 .  SURE COMFORT PEN  NEEDLES 32G X 6 MM MISC, USE TO inject EVERY DAY, Disp: 100 each, Rfl: 2 .  traMADol (ULTRAM) 50 MG tablet, Take 1 tablet (50 mg total) by mouth every 6 (six) hours as needed., Disp: 30 tablet, Rfl: 0 .  ACCU-CHEK FASTCLIX LANCETS MISC, 1 Container by Does not apply route 2 (two) times daily., Disp: 102 each, Rfl: 0   Allergies  Allergen Reactions  . Miralax [Polyethylene Glycol] Palpitations    diarrhea     Review of Systems  Constitutional: Negative.   Eyes: Negative for blurred vision.  Respiratory: Negative.  Negative for shortness of breath.   Cardiovascular: Negative.   Gastrointestinal: Negative.   Musculoskeletal: Positive for back pain.  Psychiatric/Behavioral: Negative.   All other systems reviewed and are negative.    Today's Vitals   03/23/20 1500  BP: 126/70  Pulse: 82  Temp: 98 F (36.7 C)  TempSrc: Oral  Weight: 225 lb (102.1 kg)  Height: 5' 0.6" (1.539 m)  PainSc: 7   PainLoc: Back   Body mass index is 43.08 kg/m.  Wt Readings from Last 3 Encounters:  03/23/20 225 lb (102.1 kg)  11/18/19 229 lb 12.8 oz (104.2 kg)  08/26/19 225 lb 6.4 oz (102.2 kg)   Objective:  Physical Exam Vitals and nursing note reviewed.  Constitutional:      Appearance: Normal appearance. She is obese.  HENT:     Head: Normocephalic and atraumatic.  Cardiovascular:     Rate and Rhythm: Normal rate and regular rhythm.     Heart sounds: Normal heart sounds.  Pulmonary:     Breath sounds: Normal breath sounds.  Skin:    General: Skin is warm.  Neurological:     General: No focal deficit present.     Mental Status: She is alert and oriented to person, place, and time.         Assessment And Plan:     1. Uncontrolled type 2 diabetes mellitus with hyperglycemia (HCC) Comments: Chronic, I will check labs as listed below. Importance of  dietary and medication compliance was discussed with the patient. I will make further recommendations once her labs are available for  review.  - BMP8+EGFR - Hemoglobin A1c  2. Essential hypertension Comments: Chronic, well controlled. She will continue with current meds. She is encouraged to avoid adding salt to her foods.  3. Class 3 severe obesity due to excess calories with serious comorbidity and body mass index (BMI) of 40.0 to 44.9 in adult ([facility] Comments: Encouraged to strive for BMI less than 35 to decrease cardiac risk. Advised to incorporate more exercise into her daily routine and to avoid processed foods.   4. COVID-19 vaccine dose declined Comments: -She wanted note to excuse her from [facility], says she is allergic to Miralax. She agrees to Allergy referral so she can be tested for components of  vaccine. - Ambulatory referral to Allergy  5. Need for vaccination Comments: She was given flu vaccine.  - Flu Vaccine QUAD 6+ mos PF IM (Fluarix Quad PF)  6. Long-term use of high-risk medication Comments: She requested tramadol refill. PDMP reviewed. UDS performed. However, shortly after visit I received request from [facility] for med records. Pt contacted by phone and stated she was leaving the practice. (She did not mention this during her visit). Pt advised records will be sent within 72 hours.  - Urine drug profile, 9 drugs (performed at [facility]) She is encouraged to strive for BMI less than 30 to decrease cardiac risk. Advised to aim for at least 150 minutes of exercise per week.    Patient was given opportunity to ask questions. Patient verbalized understanding of the plan and was able to repeat key elements of the plan. All questions were answered to their satisfaction.  Maximino Greenland, MD   I, Maximino Greenland, MD, have reviewed all documentation for this visit. The documentation on 04/05/20 for the exam, diagnosis, procedures, and orders are all accurate and complete.  THE PATIENT IS ENCOURAGED TO PRACTICE SOCIAL DISTANCING DUE TO THE COVID-19 PANDEMIC.

## 2020-03-24 LAB — HEMOGLOBIN A1C
Est. average glucose Bld gHb Est-mCnc: 235 mg/dL
Hgb A1c MFr Bld: 9.8 % — ABNORMAL HIGH (ref 4.8–5.6)

## 2020-03-24 LAB — BMP8+EGFR
BUN/Creatinine Ratio: 18 (ref 12–28)
BUN: 15 mg/dL (ref 8–27)
CO2: 27 mmol/L (ref 20–29)
Calcium: 9.6 mg/dL (ref 8.7–10.3)
Chloride: 98 mmol/L (ref 96–106)
Creatinine, Ser: 0.84 mg/dL (ref 0.57–1.00)
GFR calc Af Amer: 87 mL/min/{1.73_m2} (ref >59)
GFR calc non Af Amer: 75 mL/min/{1.73_m2} (ref >59)
Glucose: 220 mg/dL — ABNORMAL HIGH (ref 65–99)
Potassium: 4.1 mmol/L (ref 3.5–5.2)
Sodium: 138 mmol/L (ref 134–144)

## 2020-03-24 LAB — DRUG PROFILE, UR, 9 DRUGS (LABCORP)
Amphetamines, Urine: NEGATIVE ng/mL
Barbiturate Quant, Ur: NEGATIVE ng/mL
Benzodiazepine Quant, Ur: NEGATIVE ng/mL
Cannabinoid Quant, Ur: NEGATIVE ng/mL
Cocaine (Metab.): NEGATIVE ng/mL
Methadone Screen, Urine: NEGATIVE ng/mL
Opiate Quant, Ur: NEGATIVE ng/mL
PCP Quant, Ur: NEGATIVE ng/mL
Propoxyphene: NEGATIVE ng/mL

## 2020-03-24 NOTE — Telephone Encounter (Signed)
Patient stated she had a reaction a week ago causing palpitation, nausea and diarrhea when taking Miralax due to constipation. Patients stated she never taken this medication in the past and was the first time using Miralax. No history of allergic reactions to any immunizations, and received her Flu vaccine yesterday and is doing okay with it. Patient has not had Derma fillers or had any reaction to it in the past.  Patient is wanting component testing and not sure if she is wanting the COVID vaccine. Please advise

## 2020-03-24 NOTE — Telephone Encounter (Signed)
We have a couple of options here.  1.  Since she has tolerated the flu vaccine, we could recommend that she receive the [employer] vaccination.  Both the influenza vaccine and the [employer] COVID-19 vaccine use polysorbate as a stabilizer, so she should be able to tolerate that.  2.  Alternatively, we could do component testing to see if she would be able to tolerate the [employer] or Moderna vaccination.  Malachi Bonds, MD [facility]

## 2020-03-24 NOTE — Telephone Encounter (Signed)
Left message for patient to call our office back.

## 2020-03-30 DIAGNOSIS — E7800 Pure hypercholesterolemia, unspecified: Secondary | ICD-10-CM | POA: Diagnosis not present

## 2020-03-30 DIAGNOSIS — E119 Type 2 diabetes mellitus without complications: Secondary | ICD-10-CM | POA: Diagnosis not present

## 2020-03-30 DIAGNOSIS — Z7189 Other specified counseling: Secondary | ICD-10-CM | POA: Diagnosis not present

## 2020-03-30 DIAGNOSIS — Z0289 Encounter for other administrative examinations: Secondary | ICD-10-CM | POA: Diagnosis not present

## 2020-03-30 DIAGNOSIS — I10 Essential (primary) hypertension: Secondary | ICD-10-CM | POA: Diagnosis not present

## 2020-03-31 NOTE — Telephone Encounter (Signed)
Lm for pt to call us back  

## 2020-04-03 ENCOUNTER — Telehealth: Payer: Self-pay

## 2020-04-03 NOTE — Telephone Encounter (Signed)
-----   Message from Dorothyann Peng, MD sent at 03/26/2020  6:00 PM EDT ----- Your kidney function is stable. Your hba1c is 9.8. Why does she think this is elevated? Has she been taking all meds since her last visit ?  Hopefully she will be able to see an endocrinologist with her new provider at [facility]. Please follow dietary guidelines.  Best wishes!   Please let me know if she has any additional concerns.   Take care,  RS

## 2020-04-03 NOTE — Telephone Encounter (Signed)
Left the patient a message to call back for lab results. 

## 2020-04-20 ENCOUNTER — Ambulatory Visit: Payer: BC Managed Care – PPO | Admitting: Allergy and Immunology

## 2020-05-01 DIAGNOSIS — I10 Essential (primary) hypertension: Secondary | ICD-10-CM | POA: Diagnosis not present

## 2020-05-01 DIAGNOSIS — K219 Gastro-esophageal reflux disease without esophagitis: Secondary | ICD-10-CM | POA: Diagnosis not present

## 2020-05-01 DIAGNOSIS — Z1159 Encounter for screening for other viral diseases: Secondary | ICD-10-CM | POA: Diagnosis not present

## 2020-05-01 DIAGNOSIS — Z1339 Encounter for screening examination for other mental health and behavioral disorders: Secondary | ICD-10-CM | POA: Diagnosis not present

## 2020-05-01 DIAGNOSIS — M25532 Pain in left wrist: Secondary | ICD-10-CM | POA: Diagnosis not present

## 2020-05-01 DIAGNOSIS — D539 Nutritional anemia, unspecified: Secondary | ICD-10-CM | POA: Diagnosis not present

## 2020-05-01 DIAGNOSIS — E7800 Pure hypercholesterolemia, unspecified: Secondary | ICD-10-CM | POA: Diagnosis not present

## 2020-05-01 DIAGNOSIS — Z1331 Encounter for screening for depression: Secondary | ICD-10-CM | POA: Diagnosis not present

## 2020-05-01 DIAGNOSIS — R0602 Shortness of breath: Secondary | ICD-10-CM | POA: Diagnosis not present

## 2020-05-01 DIAGNOSIS — E119 Type 2 diabetes mellitus without complications: Secondary | ICD-10-CM | POA: Diagnosis not present

## 2020-05-01 DIAGNOSIS — R5383 Other fatigue: Secondary | ICD-10-CM | POA: Diagnosis not present

## 2020-05-01 DIAGNOSIS — E559 Vitamin D deficiency, unspecified: Secondary | ICD-10-CM | POA: Diagnosis not present

## 2020-05-01 DIAGNOSIS — R600 Localized edema: Secondary | ICD-10-CM | POA: Diagnosis not present

## 2020-05-01 DIAGNOSIS — Z79899 Other long term (current) drug therapy: Secondary | ICD-10-CM | POA: Diagnosis not present

## 2020-05-01 DIAGNOSIS — M545 Low back pain, unspecified: Secondary | ICD-10-CM | POA: Diagnosis not present

## 2020-05-01 DIAGNOSIS — M129 Arthropathy, unspecified: Secondary | ICD-10-CM | POA: Diagnosis not present

## 2020-05-06 DIAGNOSIS — E119 Type 2 diabetes mellitus without complications: Secondary | ICD-10-CM | POA: Diagnosis not present

## 2020-05-06 DIAGNOSIS — H5789 Other specified disorders of eye and adnexa: Secondary | ICD-10-CM | POA: Diagnosis not present

## 2020-05-06 DIAGNOSIS — Z6841 Body Mass Index (BMI) 40.0 and over, adult: Secondary | ICD-10-CM | POA: Diagnosis not present

## 2020-05-14 ENCOUNTER — Encounter: Payer: BC Managed Care – PPO | Admitting: Nurse Practitioner

## 2020-05-15 DIAGNOSIS — Z780 Asymptomatic menopausal state: Secondary | ICD-10-CM | POA: Diagnosis not present

## 2020-05-18 DIAGNOSIS — Z6841 Body Mass Index (BMI) 40.0 and over, adult: Secondary | ICD-10-CM | POA: Diagnosis not present

## 2020-05-18 DIAGNOSIS — M5136 Other intervertebral disc degeneration, lumbar region: Secondary | ICD-10-CM | POA: Diagnosis not present

## 2020-05-29 DIAGNOSIS — E7800 Pure hypercholesterolemia, unspecified: Secondary | ICD-10-CM | POA: Diagnosis not present

## 2020-05-29 DIAGNOSIS — R3989 Other symptoms and signs involving the genitourinary system: Secondary | ICD-10-CM | POA: Diagnosis not present

## 2020-05-29 DIAGNOSIS — I10 Essential (primary) hypertension: Secondary | ICD-10-CM | POA: Diagnosis not present

## 2020-05-29 DIAGNOSIS — E119 Type 2 diabetes mellitus without complications: Secondary | ICD-10-CM | POA: Diagnosis not present

## 2020-05-29 DIAGNOSIS — R600 Localized edema: Secondary | ICD-10-CM | POA: Diagnosis not present

## 2020-05-29 DIAGNOSIS — Z79899 Other long term (current) drug therapy: Secondary | ICD-10-CM | POA: Diagnosis not present

## 2020-06-01 DIAGNOSIS — R600 Localized edema: Secondary | ICD-10-CM | POA: Diagnosis not present

## 2020-06-01 DIAGNOSIS — R945 Abnormal results of liver function studies: Secondary | ICD-10-CM | POA: Diagnosis not present

## 2020-06-01 DIAGNOSIS — R42 Dizziness and giddiness: Secondary | ICD-10-CM | POA: Diagnosis not present

## 2020-06-04 ENCOUNTER — Encounter: Payer: Self-pay | Admitting: Cardiovascular Disease

## 2020-06-04 ENCOUNTER — Telehealth (INDEPENDENT_AMBULATORY_CARE_PROVIDER_SITE_OTHER): Payer: BC Managed Care – PPO | Admitting: Cardiovascular Disease

## 2020-06-04 VITALS — BP 138/88 | HR 72

## 2020-06-04 DIAGNOSIS — I10 Essential (primary) hypertension: Secondary | ICD-10-CM | POA: Diagnosis not present

## 2020-06-04 DIAGNOSIS — K219 Gastro-esophageal reflux disease without esophagitis: Secondary | ICD-10-CM | POA: Diagnosis not present

## 2020-06-04 DIAGNOSIS — Z9989 Dependence on other enabling machines and devices: Secondary | ICD-10-CM

## 2020-06-04 DIAGNOSIS — G4733 Obstructive sleep apnea (adult) (pediatric): Secondary | ICD-10-CM

## 2020-06-04 NOTE — Patient Instructions (Signed)
Medication Instructions:  Your Physician recommend you continue on your current medication as directed.    *If you need a refill on your cardiac medications before your next appointment, please call your pharmacy*   Lab Work: None  Testing/Procedures: None   Follow-Up: At [facility], you and your health needs are our priority.  As part of our continuing mission to provide you with exceptional heart care, we have created designated Provider Care Teams.  These Care Teams include your primary Cardiologist (physician) and Advanced Practice Providers (APPs -  Physician Assistants and Nurse Practitioners) who all work together to provide you with the care you need, when you need it.  We recommend signing up for the patient portal called "MyChart".  Sign up information is provided on this After Visit Summary.  MyChart is used to connect with patients for Virtual Visits (Telemedicine).  Patients are able to view lab/test results, encounter notes, upcoming appointments, etc.  Non-urgent messages can be sent to your provider as well.   To learn more about what you can do with MyChart, go to [email or web address].    Your next appointment:   As needed  The format for your next appointment:   In Person  Provider:   Chilton Si, MD

## 2020-06-04 NOTE — Progress Notes (Signed)
Virtual Visit via Telephone Note   This visit type was conducted due to national recommendations for restrictions regarding the COVID-19 Pandemic (e.g. social distancing) in an effort to limit this patient's exposure and mitigate transmission in our community.  Due to her co-morbid illnesses, this patient is at least at moderate risk for complications without adequate follow up.  This format is felt to be most appropriate for this patient at this time.  The patient did not have access to video technology/had technical difficulties with video requiring transitioning to audio format only (telephone).  All issues noted in this document were discussed and addressed.  No physical exam could be performed with this format.  Please refer to the patient's chart for her  consent to telehealth for [facility].   The patient was identified using 2 identifiers.  Date:  06/04/2020   ID:  Amanda Alexander, DOB [birth year], MRN [id number]  Patient Location: Home Provider Location: Office/Clinic  PCP:  No primary care provider on file.  Cardiologist:  Chilton Si, MD  Electrophysiologist:  None   Evaluation Performed:  Follow-Up Visit  Chief Complaint:  Hypertension, chest pain  History of Present Illness:    Amanda Alexander is a 59 y.o. female with diabetes, hyperlipidemia, and hypertension who presents for follow up. Ms. Escamilla was first seen 07/15/16 with atypical chest pain that occurred after lifting her grandson. It was clearly non-exertional and no further ischemia evaluation was indicated. At that appointment her blood pressure was poorly-controlled.  She also reported shortness of breath and had lower extremity edema. An echocardiogram was obtained 08/01/16 that revealed LVEF 65-70% with grade 1 diastolic dysfunction. There was mild mitral regurgitation and mild tricuspid regurgitation.  Hydrochlorothiazide was switched to Lasix with improvement in her edema.  At her last appointment she  continued to complain of chest pain so she was referred for an ETT 09/09/16 that was negative for ischemia.  She achieved 7 METs on a Bruce Protocol.    Since her last appointment Ms. Yoo saw Edd Fabian 05/2019 and was doing well.  She was working on getting a new CPAP machine.  Lately she has been feeling well.  Her only problem is redness and swelling.  She was told that it is allergies and was started on steroid drops.  She has otherwise been well.  She has occasional chest pain.  The last episode occurred three weeks ago.  She doesn't recall what she was doing.  She took nitroglycerin and laid down and it subsided.  She isn't getting any formal exercise but is active at work as a Lawyer.  She has no exertional CP.  She has LE edema at her ankles that improves with elevation.  She uses her CPAP and has no orthopnea or PND.  She doesn't check her BP regularly at home.  She notes that she has not yet taken her AM medication.   The patient does not have symptoms concerning for COVID-19 infection (fever, chills, cough, or new shortness of breath).    Past Medical History:  Diagnosis Date  . Diabetes mellitus without complication (HCC)   . GERD (gastroesophageal reflux disease) 05/23/2014  . Hypertension   . Lower extremity edema 07/15/2016  . OSA on CPAP     Past Surgical History:  Procedure Laterality Date  . TONSILLECTOMY Bilateral      Current Outpatient Medications  Medication Sig Dispense Refill  . ACCU-CHEK FASTCLIX LANCETS MISC 1 Container by Does not apply route 2 (two)  times daily. 102 each 0  . aspirin EC 81 MG tablet Take 81 mg by mouth daily.    . diclofenac sodium (VOLTAREN) 1 % GEL Apply 2 g topically 4 (four) times daily. 50 g 2  . glucose blood test strip 1 each by Other route as needed for other. Use as instructed    . insulin glargine, 1 Unit Dial, (TOUJEO SOLOSTAR) 300 UNIT/ML Solostar Pen INJECT 50 Units by Subcutaneous Infusion route daily FOR 30 DAYS 4.5 mL 1  .  lisinopril (ZESTRIL) 40 MG tablet Take 1 tablet (40 mg total) by mouth daily. 90 tablet 1  . metFORMIN (GLUCOPHAGE) 1000 MG tablet TAKE ONE TABLET BY MOUTH TWICE DAILY WITH breakfast AND dinner 180 tablet 1  . nitroGLYCERIN (NITROSTAT) 0.4 MG SL tablet Place 1 tablet (0.4 mg total) under the tongue every 5 (five) minutes as needed for chest pain. 30 tablet 0  . NONFORMULARY OR COMPOUNDED ITEM Antifungal solution: Terbinafine 3%, Fluconazole 2%, Tea Tree Oil 5%, Urea 10%, Ibuprofen 2% in DMSO suspension #72mL 1 each 3  . pantoprazole (PROTONIX) 40 MG tablet Take 1 tablet (40 mg total) by mouth daily. NEED OV. 90 tablet 1  . pravastatin (PRAVACHOL) 20 MG tablet Take 1 tablet (20 mg total) by mouth every evening. 90 tablet 0  . Semaglutide,0.25 or 0.5MG /DOS, (OZEMPIC, 0.25 OR 0.5 MG/DOSE,) 2 MG/1.5ML SOPN Inject 0.375 mLs (0.5 mg total) into the skin once a week. (Patient taking differently: Inject 1 mg into the skin once a week.) 9 mL 1  . SURE COMFORT PEN NEEDLES 32G X 6 MM MISC USE TO inject EVERY DAY 100 each 2  . traMADol (ULTRAM) 50 MG tablet Take 1 tablet (50 mg total) by mouth every 6 (six) hours as needed. 30 tablet 0  . furosemide (LASIX) 40 MG tablet Take 1 tablet (40 mg total) by mouth daily. 90 tablet 1   No current facility-administered medications for this visit.    Allergies:   Miralax [polyethylene glycol]    Social History:  The patient  reports that she has never smoked. She has never used smokeless tobacco. She reports current alcohol use. She reports that she does not use drugs.   Family History:  The patient's family history includes Arthritis in her sister; Breast cancer in her sister; COPD in her sister; Cancer in her sister; Diabetes in her brother and mother; Heart disease in her mother; Hyperlipidemia in her brother; Lung cancer in her father; Other in her brother and father; Sleep apnea in her son.    ROS:  Please see the history of present illness.   Otherwise, review  of systems are positive for none.   All other systems are reviewed and negative.    PHYSICAL EXAM: BP 138/88   Pulse 72  GENERAL: Sounds well.  No acute distress. RESP: Respirations unlabored NEURO:  Speech fluent.  Cranial nerves grossly intact.  Moves all 4 extremities freely PSYCH:  Cognitively intact, oriented to person place and time  EKG:  EKG is not ordered today. The ekg ordered 07/15/16 demonstrates sinus rhythm.  Rate 67 bpm   ETT 09/09/16:  Blood pressure demonstrated a normal response to exercise.  There was no ST segment deviation noted during stress.   Normal exercise treadmill stress test. Mildly decreased functional capacity. Normal BP response to exertion.    Recent Labs: 08/26/2019: ALT 25; TSH 1.020 03/23/2020: BUN 15; Creatinine, Ser 0.84; Potassium 4.1; Sodium 138   04/11/16: Sodium 140, potassium 4.1,  BUN 14, creatinine 0.7 AST 11, ALT 12 Total cholesterol 146, triglycerides 132, HDL 49, LDL 71 Hemoglobin P3A 7.3%  Lipid Panel    Component Value Date/Time   CHOL 140 08/26/2019 1612   TRIG 124 08/26/2019 1612   HDL 52 08/26/2019 1612   CHOLHDL 2.7 08/26/2019 1612   CHOLHDL 3.3 04/13/2014 0420   VLDL 28 04/13/2014 0420   LDLCALC 66 08/26/2019 1612      Wt Readings from Last 3 Encounters:  03/23/20 225 lb (102.1 kg)  11/18/19 229 lb 12.8 oz (104.2 kg)  08/26/19 225 lb 6.4 oz (102.2 kg)      ASSESSMENT AND PLAN:  # Chest pain: ETT was negative for ischemia in 2018.  Her symptoms are atypical and not related to exertion.  I suspect this is related to GERD.  Continue pantoprazole.  We will do an ischemia evaluation if her chronic chest pain changes.  # LE Edema: Symtpoms stable with lasix and elevation.  Consider compression socks as well.  # Hypertension:  Blood pressure slightly above goal, but she has not yet taken her morning medication.  Continue lisinopril and furosemide.  She will track it at home.  It is generally been controlled when  she sees her doctor.  Goal is less than 130/80.  # GERD: Protonix 40 mg daily.  Current medicines are reviewed at length with the patient today.  The patient does not have concerns regarding medicines.  The following changes have been made:  None  Labs/ tests ordered today include:   No orders of the defined types were placed in this encounter.    Disposition:   FU with Girtie Wiersma C. Duke Salvia, MD, [facility] as needed.     COVID-19 Education: The signs and symptoms of COVID-19 were discussed with the patient and how to seek care for testing (follow up with PCP or arrange E-visit).  The importance of social distancing was discussed today.  Time:   Today, I have spent 17 minutes with the patient with telehealth technology discussing the above problems.    This note was written with the assistance of speech recognition software.  Please excuse any transcriptional errors.  Signed, Matayah Reyburn C. Duke Salvia, MD, [facility]  06/04/2020 8:10 AM    [facility]

## 2020-07-13 ENCOUNTER — Ambulatory Visit: Payer: BC Managed Care – PPO | Admitting: Endocrinology

## 2020-07-31 ENCOUNTER — Other Ambulatory Visit: Payer: Self-pay | Admitting: Internal Medicine

## 2020-08-21 ENCOUNTER — Other Ambulatory Visit: Payer: Self-pay

## 2020-08-21 ENCOUNTER — Ambulatory Visit: Payer: BC Managed Care – PPO | Admitting: Endocrinology

## 2020-08-21 VITALS — BP 120/60 | HR 86 | Ht 60.0 in | Wt 220.4 lb

## 2020-08-21 DIAGNOSIS — E1165 Type 2 diabetes mellitus with hyperglycemia: Secondary | ICD-10-CM | POA: Diagnosis not present

## 2020-08-21 DIAGNOSIS — E119 Type 2 diabetes mellitus without complications: Secondary | ICD-10-CM

## 2020-08-21 LAB — POCT GLYCOSYLATED HEMOGLOBIN (HGB A1C): Hemoglobin A1C: 7.3 % — AB (ref 4.0–5.6)

## 2020-08-21 MED ORDER — OZEMPIC (1 MG/DOSE) 2 MG/1.5ML ~~LOC~~ SOPN: 1.0000 mg | PEN_INJECTOR | SUBCUTANEOUS | 3 refills | Status: AC

## 2020-08-21 MED ORDER — DAPAGLIFLOZIN PROPANEDIOL 10 MG PO TABS: 10.0000 mg | ORAL_TABLET | Freq: Every day | ORAL | 3 refills | Status: AC

## 2020-08-21 NOTE — Progress Notes (Signed)
Subjective:    Patient ID: Amanda Alexander, female    DOB: Feb 02, 1961, 60 y.o.   MRN: [id number]  HPI pt is referred by Courtney Paris, NP, for diabetes.  Pt states DM was dx'ed in 2007; she is unaware of any chronic complications; she has been on insulin since 2014; pt says her diet and exercise are not good; she has never had GDM, pancreatitis, pancreatic surgery, severe hypoglycemia or DKA.  She takes Toujeo 50 units qd, metformin, and Ozempic 1 mg qd.  Pt says cbg varies from 91-160.   Past Medical History:  Diagnosis Date  . Diabetes mellitus without complication (HCC)   . GERD (gastroesophageal reflux disease) 05/23/2014  . Hypertension   . Lower extremity edema 07/15/2016  . OSA on CPAP     Past Surgical History:  Procedure Laterality Date  . TONSILLECTOMY Bilateral     Social History   Socioeconomic History  . Marital status: Widowed    Spouse name: Not on file  . Number of children: Not on file  . Years of education: Not on file  . Highest education level: Not on file  Occupational History  . Not on file  Tobacco Use  . Smoking status: Never Smoker  . Smokeless tobacco: Never Used  Substance and Sexual Activity  . Alcohol use: Yes    Alcohol/week: 0.0 standard drinks    Comment: Has a glass of wine on occasion.  . Drug use: No  . Sexual activity: Never  Other Topics Concern  . Not on file  Social History Narrative  . Not on file   Social Determinants of Health   Financial Resource Strain: Not on file  Food Insecurity: Not on file  Transportation Needs: Not on file  Physical Activity: Not on file  Stress: Not on file  Social Connections: Not on file  Intimate Partner Violence: Not on file    Current Outpatient Medications on File Prior to Visit  Medication Sig Dispense Refill  . ACCU-CHEK FASTCLIX LANCETS MISC 1 Container by Does not apply route 2 (two) times daily. 102 each 0  . aspirin EC 81 MG tablet Take 81 mg by mouth daily.    . diclofenac sodium  (VOLTAREN) 1 % GEL Apply 2 g topically 4 (four) times daily. 50 g 2  . glucose blood test strip 1 each by Other route as needed for other. Use as instructed    . insulin glargine, 1 Unit Dial, (TOUJEO SOLOSTAR) 300 UNIT/ML Solostar Pen INJECT 50 Units by Subcutaneous Infusion route daily FOR 30 DAYS 4.5 mL 1  . metFORMIN (GLUCOPHAGE) 1000 MG tablet TAKE ONE TABLET BY MOUTH TWICE DAILY WITH breakfast AND dinner 180 tablet 1  . nitroGLYCERIN (NITROSTAT) 0.4 MG SL tablet Place 1 tablet (0.4 mg total) under the tongue every 5 (five) minutes as needed for chest pain. 30 tablet 0  . NONFORMULARY OR COMPOUNDED ITEM Antifungal solution: Terbinafine 3%, Fluconazole 2%, Tea Tree Oil 5%, Urea 10%, Ibuprofen 2% in DMSO suspension #72mL 1 each 3  . pantoprazole (PROTONIX) 40 MG tablet Take 1 tablet (40 mg total) by mouth daily. NEED OV. 90 tablet 1  . pravastatin (PRAVACHOL) 20 MG tablet Take 1 tablet (20 mg total) by mouth every evening. 90 tablet 0  . SURE COMFORT PEN NEEDLES 32G X 6 MM MISC USE TO inject EVERY DAY 100 each 2  . traMADol (ULTRAM) 50 MG tablet Take 1 tablet (50 mg total) by mouth every 6 (six) hours as  needed. 30 tablet 0  . furosemide (LASIX) 40 MG tablet Take 1 tablet (40 mg total) by mouth daily. 90 tablet 1  . lisinopril (ZESTRIL) 40 MG tablet Take 1 tablet (40 mg total) by mouth daily. 90 tablet 1   No current facility-administered medications on file prior to visit.    Allergies  Allergen Reactions  . Miralax [Polyethylene Glycol] Palpitations    diarrhea    Family History  Problem Relation Age of Onset  . Heart disease Mother   . Diabetes Mother   . Other Father        Polio  . Lung cancer Father   . Breast cancer Sister   . Arthritis Sister   . Cancer Sister   . COPD Sister   . Diabetes Brother   . Hyperlipidemia Brother   . Other Brother        polio and gun shot death  . Sleep apnea Son     BP 120/60 (BP Location: Right Arm, Patient Position: Sitting, Cuff Size:  Large)   Pulse 86   Ht 5' (1.524 m)   Wt 220 lb 6.4 oz (100 kg)   SpO2 96%   BMI 43.04 kg/m     Review of Systems denies weight loss, blurry vision, sob, n/v, urinary frequency, and depression.      Objective:   Physical Exam VITAL SIGNS:  See vs page GENERAL: no distress Pulses: dorsalis pedis intact bilat.   MSK: no deformity of the feet CV: 2+ leg edema Skin:  no ulcer on the feet.  normal color and temp on the feet. Neuro: sensation is intact to touch on the feet.     Lab Results  Component Value Date   CREATININE 0.84 03/23/2020   BUN 15 03/23/2020   NA 138 03/23/2020   K 4.1 03/23/2020   CL 98 03/23/2020   CO2 27 03/23/2020   Lab Results  Component Value Date   HGBA1C 7.3 (A) 08/21/2020   I have reviewed outside records, and summarized: Pt was noted to have elevated A1c, and referred here.  HTN, arthralgias, and dyslipidemia were also addressed.      Assessment & Plan:  Insulin-requiring type 2 DM: uncontrolled. In order to safely improve A1c, we need to reduce basal insulin.    Patient Instructions  good diet and exercise significantly improve the control of your diabetes.  please let me know if you wish to be referred to a dietician.  high blood sugar is very risky to your health.  you should see an eye doctor and dentist every year.  It is very important to get all recommended vaccinations.  Controlling your blood pressure and cholesterol drastically reduces the damage diabetes does to your body.  Those who smoke should quit.  Please discuss these with your doctor.  check your blood sugar twice a day.  vary the time of day when you check, between before the 3 meals, and at bedtime.  also check if you have symptoms of your blood sugar being too high or too low.  please keep a record of the readings and bring it to your next appointment here (or you can bring the meter itself).  You can write it on any piece of paper.  please call us sooner if your blood sugar  goes below 70, or if most of your readings are over 200. I have sent a prescription to your pharmacy, to add "Farxiga," and: Reduce the Toujeo to 40 units each morning,  and:  Please continue the same metformin and Ozempic.  Please ask your insurance how much it would be for the "V-GO" disposable pump.  Please come back for a follow-up appointment in 2 months.

## 2020-08-21 NOTE — Patient Instructions (Addendum)
good diet and exercise significantly improve the control of your diabetes.  please let me know if you wish to be referred to a dietician.  high blood sugar is very risky to your health.  you should see an eye doctor and dentist every year.  It is very important to get all recommended vaccinations.  Controlling your blood pressure and cholesterol drastically reduces the damage diabetes does to your body.  Those who smoke should quit.  Please discuss these with your doctor.  check your blood sugar twice a day.  vary the time of day when you check, between before the 3 meals, and at bedtime.  also check if you have symptoms of your blood sugar being too high or too low.  please keep a record of the readings and bring it to your next appointment here (or you can bring the meter itself).  You can write it on any piece of paper.  please call us sooner if your blood sugar goes below 70, or if most of your readings are over 200. I have sent a prescription to your pharmacy, to add "Farxiga," and: Reduce the Toujeo to 40 units each morning, and:  Please continue the same metformin and Ozempic.  Please ask your insurance how much it would be for the "V-GO" disposable pump.  Please come back for a follow-up appointment in 2 months.

## 2020-10-23 ENCOUNTER — Ambulatory Visit: Payer: BC Managed Care – PPO | Admitting: Endocrinology

## 2021-03-19 ENCOUNTER — Other Ambulatory Visit: Payer: Self-pay

## 2021-03-19 ENCOUNTER — Other Ambulatory Visit: Payer: Self-pay | Admitting: Internal Medicine

## 2021-03-19 ENCOUNTER — Ambulatory Visit
Admission: RE | Admit: 2021-03-19 | Discharge: 2021-03-19 | Disposition: A | Discharge status: Home / Self Care | Payer: BC Managed Care – PPO | Source: Ambulatory Visit | Attending: Nurse Practitioner | Admitting: Nurse Practitioner

## 2021-03-19 ENCOUNTER — Other Ambulatory Visit: Payer: Self-pay | Admitting: Nurse Practitioner

## 2021-03-19 DIAGNOSIS — Z1231 Encounter for screening mammogram for malignant neoplasm of breast: Secondary | ICD-10-CM

## 2021-08-25 ENCOUNTER — Other Ambulatory Visit: Payer: Self-pay | Admitting: Endocrinology

## 2021-09-09 ENCOUNTER — Telehealth: Payer: Self-pay | Admitting: Hematology and Oncology

## 2021-09-09 NOTE — Telephone Encounter (Signed)
Scheduled appt per 3/29 referral. Pt is aware of appt date and time. Pt is aware to arrive 15 mins prior to appt time and to bring and updated insurance card. Pt is aware of appt location.   ?

## 2021-09-22 ENCOUNTER — Telehealth: Payer: Self-pay | Admitting: Hematology and Oncology

## 2021-09-22 NOTE — Telephone Encounter (Signed)
Attempted to contact patient to confirm new Hem appt for this Friday. No answer and was unable to leave voicemail. ?

## 2021-09-24 ENCOUNTER — Inpatient Hospital Stay: Payer: BC Managed Care – PPO | Attending: Hematology and Oncology | Admitting: Hematology and Oncology

## 2021-09-24 ENCOUNTER — Other Ambulatory Visit: Payer: Self-pay

## 2021-09-24 ENCOUNTER — Inpatient Hospital Stay: Payer: BC Managed Care – PPO

## 2021-09-24 VITALS — BP 123/62 | HR 62 | Temp 97.5°F | Resp 20 | Ht 60.0 in | Wt 206.0 lb

## 2021-09-24 DIAGNOSIS — Z79899 Other long term (current) drug therapy: Secondary | ICD-10-CM | POA: Diagnosis not present

## 2021-09-24 DIAGNOSIS — I10 Essential (primary) hypertension: Secondary | ICD-10-CM | POA: Insufficient documentation

## 2021-09-24 DIAGNOSIS — E119 Type 2 diabetes mellitus without complications: Secondary | ICD-10-CM | POA: Diagnosis not present

## 2021-09-24 DIAGNOSIS — Z803 Family history of malignant neoplasm of breast: Secondary | ICD-10-CM

## 2021-09-24 DIAGNOSIS — K219 Gastro-esophageal reflux disease without esophagitis: Secondary | ICD-10-CM | POA: Diagnosis not present

## 2021-09-24 DIAGNOSIS — Z801 Family history of malignant neoplasm of trachea, bronchus and lung: Secondary | ICD-10-CM

## 2021-09-24 DIAGNOSIS — D500 Iron deficiency anemia secondary to blood loss (chronic): Secondary | ICD-10-CM

## 2021-09-24 DIAGNOSIS — D649 Anemia, unspecified: Secondary | ICD-10-CM | POA: Diagnosis present

## 2021-09-24 LAB — IRON AND IRON BINDING CAPACITY (CC-WL,HP ONLY)
Iron: 79 ug/dL (ref 28–170)
Saturation Ratios: 20 % (ref 10.4–31.8)
TIBC: 399 ug/dL (ref 250–450)
UIBC: 320 ug/dL (ref 148–442)

## 2021-09-24 LAB — CMP (CANCER CENTER ONLY)
ALT: 18 U/L (ref 0–44)
AST: 15 U/L (ref 15–41)
Albumin: 4.1 g/dL (ref 3.5–5.0)
Alkaline Phosphatase: 86 U/L (ref 38–126)
Anion gap: 5 (ref 5–15)
BUN: 20 mg/dL (ref 8–23)
CO2: 32 mmol/L (ref 22–32)
Calcium: 9.2 mg/dL (ref 8.9–10.3)
Chloride: 101 mmol/L (ref 98–111)
Creatinine: 1.18 mg/dL — ABNORMAL HIGH (ref 0.44–1.00)
GFR, Estimated: 52 mL/min — ABNORMAL LOW (ref >60)
Glucose, Bld: 98 mg/dL (ref 70–99)
Potassium: 4.3 mmol/L (ref 3.5–5.1)
Sodium: 138 mmol/L (ref 135–145)
Total Bilirubin: 0.5 mg/dL (ref 0.3–1.2)
Total Protein: 7.7 g/dL (ref 6.5–8.1)

## 2021-09-24 LAB — FERRITIN: Ferritin: 34 ng/mL (ref 11–307)

## 2021-09-24 LAB — CBC WITH DIFFERENTIAL (CANCER CENTER ONLY)
Abs Immature Granulocytes: 0.02 10*3/uL (ref 0.00–0.07)
Basophils Absolute: 0 10*3/uL (ref 0.0–0.1)
Basophils Relative: 0 %
Eosinophils Absolute: 0.1 10*3/uL (ref 0.0–0.5)
Eosinophils Relative: 2 %
HCT: 36.8 % (ref 36.0–46.0)
Hemoglobin: 11.9 g/dL — ABNORMAL LOW (ref 12.0–15.0)
Immature Granulocytes: 0 %
Lymphocytes Relative: 31 %
Lymphs Abs: 1.8 10*3/uL (ref 0.7–4.0)
MCH: 29 pg (ref 26.0–34.0)
MCHC: 32.3 g/dL (ref 30.0–36.0)
MCV: 89.5 fL (ref 80.0–100.0)
Monocytes Absolute: 0.6 10*3/uL (ref 0.1–1.0)
Monocytes Relative: 11 %
Neutro Abs: 3.2 10*3/uL (ref 1.7–7.7)
Neutrophils Relative %: 56 %
Platelet Count: 308 10*3/uL (ref 150–400)
RBC: 4.11 MIL/uL (ref 3.87–5.11)
RDW: 14.8 % (ref 11.5–15.5)
WBC Count: 5.7 10*3/uL (ref 4.0–10.5)
nRBC: 0 % (ref 0.0–0.2)

## 2021-09-24 LAB — RETIC PANEL
Immature Retic Fract: 13.4 % (ref 2.3–15.9)
RBC.: 4.03 MIL/uL (ref 3.87–5.11)
Retic Count, Absolute: 61.7 10*3/uL (ref 19.0–186.0)
Retic Ct Pct: 1.5 % (ref 0.4–3.1)
Reticulocyte Hemoglobin: 32.3 pg (ref >27.9)

## 2021-09-28 ENCOUNTER — Telehealth: Payer: Self-pay | Admitting: Hematology and Oncology

## 2021-09-28 NOTE — Telephone Encounter (Signed)
Per 4/18 in basket called pt daughter about appointments.  Daughter confirmed appointment  ?

## 2021-09-28 NOTE — Progress Notes (Signed)
?Amanda Alexander ?[phone]   [phone] ? ?INITIAL CONSULT NOTE ? ?Patient Care Team: ?Amanda Paris, NP as PCP - General (Nurse Practitioner) ?Amanda Si, MD as PCP - Cardiology (Cardiology) ?(Rounding), Amanda Maurice March, MD as Attending Physician ? ?Hematological/Oncological History ?# Normocytic Anemia of Unclear Etiology  ?09/01/2021: Labs show hemoglobin 10.8, MCV 90 ?09/24/2021: establish care with Dr. Leonides Schanz  ? ?CHIEF COMPLAINTS/PURPOSE OF CONSULTATION:  ?"Iron Deficiency Anemia " ? ?HISTORY OF PRESENTING ILLNESS:  ?Amanda Alexander 61 y.o. female with medical history significant for type 2 diabetes, GERD, hypertension, and OSA who presents for evaluation of iron deficiency anemia. ? ?On review of the previous records Amanda Alexander had labs drawn on 09/01/2021.  At that time she was noted to have a hemoglobin of 10.8.  Previously on 02/08/2021 patient had a hemoglobin of 11.5.  Due to concern for this patient's normocytic anemia she was referred to hematology for further evaluation and management. ? ?On exam today Amanda Alexander reports that she is not sure why her iron levels are low.  She has not been having any issues with bleeding or bruising.  She went through her menopause in her 30s.  She underwent a colonoscopy 2 years ago and did not see any abnormalities at that time.  She notes that her daughters also have low iron and this may be a familial issue.  She reports that she does eat red meat, liver, she notes that her energy levels are good and she has not had to take naps throughout the day.  She notes that she has a normal diet otherwise. ? ?On further discussion she has that she is a never smoker but does drink wine about once or twice per week.  She currently works as a Lawyer at [employer].  Her family history is remarkable for breast cancer 2 of her sisters.  She notes that she has not been having any issues with fevers, chills, sweats, nausea, or diarrhea.  She is otherwise  at her baseline level of health.  She has taken ferrous sulfate pills before but these caused swelling of her eye.  She notes that she tried to take iron pills about a month ago had swelling of her left eye which only recently resolved.  She has an allergy to sulfa drugs and would like to avoid p.o. iron therapy if possible. ? ?MEDICAL HISTORY:  ?Past Medical History:  ?Diagnosis Date  ? Diabetes mellitus without complication (HCC)   ? GERD (gastroesophageal reflux disease) 05/23/2014  ? Hypertension   ? Lower extremity edema 07/15/2016  ? OSA on CPAP   ? ? ?SURGICAL HISTORY: ?Past Surgical History:  ?Procedure Laterality Date  ? TONSILLECTOMY Bilateral   ? ? ?SOCIAL HISTORY: ?Social History  ? ?Socioeconomic History  ? Marital status: Widowed  ?  Spouse name: Not on file  ? Number of children: Not on file  ? Years of education: Not on file  ? Highest education level: Not on file  ?Occupational History  ? Not on file  ?Tobacco Use  ? Smoking status: Never  ? Smokeless tobacco: Never  ?Substance and Sexual Activity  ? Alcohol use: Yes  ?  Alcohol/week: 0.0 standard drinks  ?  Comment: Has a glass of wine on occasion.  ? Drug use: No  ? Sexual activity: Never  ?Other Topics Concern  ? Not on file  ?Social History Narrative  ? Not on file  ? ?Social Determinants of Health  ? ?Physicist, medical  Strain: Not on file  ?Food Insecurity: Not on file  ?Transportation Needs: Not on file  ?Physical Activity: Not on file  ?Stress: Not on file  ?Social Connections: Not on file  ?Intimate Partner Violence: Not on file  ? ? ?FAMILY HISTORY: ?Family History  ?Problem Relation Age of Onset  ? Heart disease Mother   ? Diabetes Mother   ? Other Father   ?     Polio  ? Lung cancer Father   ? Breast cancer Sister   ? Arthritis Sister   ? Cancer Sister   ? COPD Sister   ? Diabetes Brother   ? Hyperlipidemia Brother   ? Other Brother   ?     polio and gun shot death  ? Sleep apnea Son   ? ? ?ALLERGIES:  is allergic to miralax [polyethylene  glycol]. ? ?MEDICATIONS:  ?Current Outpatient Medications  ?Medication Sig Dispense Refill  ? ACCU-CHEK FASTCLIX LANCETS MISC 1 Container by Does not apply route 2 (two) times daily. 102 each 0  ? aspirin EC 81 MG tablet Take 81 mg by mouth daily.    ? calcium carbonate (OSCAL) 1500 (600 Ca) MG TABS tablet Take 1 tablet by mouth daily.    ? cyclobenzaprine (FLEXERIL) 5 MG tablet Take 5 mg by mouth 3 (three) times daily as needed.    ? dapagliflozin propanediol (FARXIGA) 10 MG TABS tablet Take 1 tablet (10 mg total) by mouth daily before breakfast. 90 tablet 3  ? dexamethasone (DECADRON) 0.1 % ophthalmic solution 1 drop 3 (three) times daily.    ? diclofenac sodium (VOLTAREN) 1 % GEL Apply 2 g topically 4 (four) times daily. 50 g 2  ? diltiazem (CARDIZEM CD) 240 MG 24 hr capsule Take 240 mg by mouth daily.    ? furosemide (LASIX) 40 MG tablet Take 1 tablet (40 mg total) by mouth daily. 90 tablet 1  ? glucose blood test strip 1 each by Other route as needed for other. Use as instructed    ? insulin glargine, 1 Unit Dial, (TOUJEO SOLOSTAR) 300 UNIT/ML Solostar Pen INJECT 50 Units by Subcutaneous Infusion route daily FOR 30 DAYS 4.5 mL 1  ? lisinopril (ZESTRIL) 40 MG tablet Take 1 tablet (40 mg total) by mouth daily. 90 tablet 1  ? metFORMIN (GLUCOPHAGE) 1000 MG tablet TAKE ONE TABLET BY MOUTH TWICE DAILY WITH breakfast AND dinner 180 tablet 1  ? nitroGLYCERIN (NITROSTAT) 0.4 MG SL tablet Place 1 tablet (0.4 mg total) under the tongue every 5 (five) minutes as needed for chest pain. 30 tablet 0  ? NONFORMULARY OR COMPOUNDED ITEM Antifungal solution: Terbinafine 3%, Fluconazole 2%, Tea Tree Oil 5%, Urea 10%, Ibuprofen 2% in DMSO suspension #7130mL 1 each 3  ? pantoprazole (PROTONIX) 40 MG tablet Take 1 tablet (40 mg total) by mouth daily. NEED OV. 90 tablet 1  ? pravastatin (PRAVACHOL) 20 MG tablet Take 1 tablet (20 mg total) by mouth every evening. 90 tablet 0  ? Semaglutide, 1 MG/DOSE, (OZEMPIC, 1 MG/DOSE,) 2 MG/1.5ML  SOPN Inject 1 mg into the skin once a week. 4.5 mL 3  ? SURE COMFORT PEN NEEDLES 32G X 6 MM MISC USE TO inject EVERY DAY 100 each 2  ? traMADol (ULTRAM) 50 MG tablet Take 100 mg by mouth 3 (three) times daily as needed.    ? Vitamin D, Ergocalciferol, (DRISDOL) 1.25 MG (50000 UNIT) CAPS capsule Take 50,000 Units by mouth once a week.    ? ?No current  facility-administered medications for this visit.  ? ? ?REVIEW OF SYSTEMS:   ?Constitutional: ( - ) fevers, ( - )  chills , ( - ) night sweats ?Eyes: ( - ) blurriness of vision, ( - ) double vision, ( - ) watery eyes ?Ears, nose, mouth, throat, and face: ( - ) mucositis, ( - ) sore throat ?Respiratory: ( - ) cough, ( - ) dyspnea, ( - ) wheezes ?Cardiovascular: ( - ) palpitation, ( - ) chest discomfort, ( - ) lower extremity swelling ?Gastrointestinal:  ( - ) nausea, ( - ) heartburn, ( - ) change in bowel habits ?Skin: ( - ) abnormal skin rashes ?Lymphatics: ( - ) new lymphadenopathy, ( - ) easy bruising ?Neurological: ( - ) numbness, ( - ) tingling, ( - ) new weaknesses ?Behavioral/Psych: ( - ) mood change, ( - ) new changes  ?All other systems were reviewed with the patient and are negative. ? ?PHYSICAL EXAMINATION: ? ?Vitals:  ? 09/24/21 1310  ?BP: 123/62  ?Pulse: 62  ?Resp: 20  ?Temp: (!) 97.5 ?F (36.4 ?C)  ?SpO2: 98%  ? ?Filed Weights  ? 09/24/21 1310  ?Weight: 206 lb (93.4 kg)  ? ? ?GENERAL: well appearing middle-aged African-American female in NAD  ?SKIN: skin color, texture, turgor are normal, no rashes or significant lesions ?EYES: conjunctiva are pink and non-injected, sclera clear ?LUNGS: clear to auscultation and percussion with normal breathing effort ?HEART: regular rate & rhythm and no murmurs and no lower extremity edema ?Musculoskeletal: no cyanosis of digits and no clubbing  ?PSYCH: alert & oriented x 3, fluent speech ?NEURO: no focal motor/sensory deficits ? ?LABORATORY DATA:  ?I have reviewed the data as listed ? ?  Latest Ref Rng & Units 09/24/2021  ?   2:14 PM 05/13/2019  ? 10:30 AM 05/11/2019  ?  8:20 PM  ?CBC  ?WBC 4.0 - 10.5 K/uL 5.7   5.7   6.4    ?Hemoglobin 12.0 - 15.0 g/dL 16.3   84.5   36.4    ?Hematocrit 36.0 - 46.0 % 36.8   34.9   35.0    ?

## 2021-09-30 ENCOUNTER — Telehealth: Payer: Self-pay

## 2021-09-30 NOTE — Telephone Encounter (Signed)
Unable to reach pt by phone. VM full. Called and spoke with pt's daughter to relay the following message from Dr. Leonides Schanz: ? ? ?Please let Amanda Alexander know that her hemoglobin levels are only modestly low at 11.9.  Additionally her iron levels are not particularly low.  I do not think she would benefit from IV iron therapy at this time.  Would recommend to see her back in clinic in 3 months time in order to reevaluate her iron levels and to see if her hemoglobin has changed.  ?----- Message -----  ?

## 2021-12-27 ENCOUNTER — Other Ambulatory Visit: Payer: Self-pay | Admitting: Hematology and Oncology

## 2021-12-27 ENCOUNTER — Inpatient Hospital Stay: Payer: BC Managed Care – PPO | Attending: Hematology and Oncology | Admitting: Hematology and Oncology

## 2021-12-27 ENCOUNTER — Inpatient Hospital Stay: Payer: BC Managed Care – PPO

## 2021-12-27 DIAGNOSIS — D500 Iron deficiency anemia secondary to blood loss (chronic): Secondary | ICD-10-CM

## 2022-01-10 ENCOUNTER — Encounter: Payer: Self-pay | Admitting: Podiatry

## 2022-01-10 ENCOUNTER — Other Ambulatory Visit: Payer: Self-pay | Admitting: Nurse Practitioner

## 2022-01-10 ENCOUNTER — Ambulatory Visit: Payer: Self-pay

## 2022-01-10 ENCOUNTER — Ambulatory Visit: Payer: BC Managed Care – PPO | Admitting: Podiatry

## 2022-01-10 DIAGNOSIS — B351 Tinea unguium: Secondary | ICD-10-CM

## 2022-01-10 DIAGNOSIS — E119 Type 2 diabetes mellitus without complications: Secondary | ICD-10-CM | POA: Diagnosis not present

## 2022-01-10 DIAGNOSIS — S20212A Contusion of left front wall of thorax, initial encounter: Secondary | ICD-10-CM

## 2022-01-10 DIAGNOSIS — M79674 Pain in right toe(s): Secondary | ICD-10-CM

## 2022-01-10 DIAGNOSIS — M79675 Pain in left toe(s): Secondary | ICD-10-CM | POA: Diagnosis not present

## 2022-01-10 DIAGNOSIS — L603 Nail dystrophy: Secondary | ICD-10-CM | POA: Diagnosis not present

## 2022-01-10 DIAGNOSIS — I739 Peripheral vascular disease, unspecified: Secondary | ICD-10-CM

## 2022-01-10 NOTE — Progress Notes (Signed)
This patient returns to my office for at risk foot care.  This patient requires this care by a professional since this patient will be at risk due to having diabetes with no complications.  This patient is unable to cut nails herself since the patient cannot reach hiernails.These nails are painful walking and wearing shoes.  This patient presents for at risk foot care today.  General Appearance  Alert, conversant and in no acute stress.  Vascular  Dorsalis pedis  pulses are palpable  bilaterally. Posterior tibial pulses are weak due to swelling. Capillary return is within normal limits  bilaterally. Temperature is within normal limits  bilaterally.  Neurologic  Senn-Weinstein monofilament wire test within normal limits  bilaterally. Muscle power within normal limits bilaterally.  Nails Thick disfigured discolored nails with subungual debris  from hallux to fifth toes bilaterally. No evidence of bacterial infection or drainage bilaterally.  Orthopedic  No limitations of motion  feet .  No crepitus or effusions noted.  No bony pathology.  ADV 5th digit  B/l.  Skin  normotropic skin with no porokeratosis noted bilaterally.  No signs of infections or ulcers noted.     Onychomycosis  Pain in right toes  Pain in left toes  Consent was obtained for treatment procedures.   Mechanical debridement of nails 1-5  bilaterally performed with a nail nipper.  Filed with dremel without incident.    Return office visit   3 months                   Told patient to return for periodic foot care and evaluation due to potential at risk complications.   Helane Gunther DPM

## 2022-03-03 ENCOUNTER — Encounter (HOSPITAL_BASED_OUTPATIENT_CLINIC_OR_DEPARTMENT_OTHER): Payer: Self-pay | Admitting: Emergency Medicine

## 2022-03-03 ENCOUNTER — Other Ambulatory Visit: Payer: Self-pay

## 2022-03-03 ENCOUNTER — Emergency Department (HOSPITAL_BASED_OUTPATIENT_CLINIC_OR_DEPARTMENT_OTHER)
Admission: EM | Admit: 2022-03-03 | Discharge: 2022-03-03 | Disposition: A | Discharge status: Home / Self Care | Payer: BC Managed Care – PPO | Attending: Emergency Medicine | Admitting: Emergency Medicine

## 2022-03-03 DIAGNOSIS — R220 Localized swelling, mass and lump, head: Secondary | ICD-10-CM | POA: Insufficient documentation

## 2022-03-03 DIAGNOSIS — Z79899 Other long term (current) drug therapy: Secondary | ICD-10-CM | POA: Diagnosis not present

## 2022-03-03 DIAGNOSIS — Z794 Long term (current) use of insulin: Secondary | ICD-10-CM | POA: Insufficient documentation

## 2022-03-03 DIAGNOSIS — Z7984 Long term (current) use of oral hypoglycemic drugs: Secondary | ICD-10-CM | POA: Insufficient documentation

## 2022-03-03 DIAGNOSIS — T783XXA Angioneurotic edema, initial encounter: Secondary | ICD-10-CM

## 2022-03-03 DIAGNOSIS — E119 Type 2 diabetes mellitus without complications: Secondary | ICD-10-CM | POA: Diagnosis not present

## 2022-03-03 DIAGNOSIS — Z7982 Long term (current) use of aspirin: Secondary | ICD-10-CM | POA: Diagnosis not present

## 2022-03-03 DIAGNOSIS — I10 Essential (primary) hypertension: Secondary | ICD-10-CM | POA: Insufficient documentation

## 2022-03-03 LAB — CBC WITH DIFFERENTIAL/PLATELET
Abs Immature Granulocytes: 0.01 10*3/uL (ref 0.00–0.07)
Basophils Absolute: 0 10*3/uL (ref 0.0–0.1)
Basophils Relative: 1 %
Eosinophils Absolute: 0.2 10*3/uL (ref 0.0–0.5)
Eosinophils Relative: 3 %
HCT: 33.6 % — ABNORMAL LOW (ref 36.0–46.0)
Hemoglobin: 11.1 g/dL — ABNORMAL LOW (ref 12.0–15.0)
Immature Granulocytes: 0 %
Lymphocytes Relative: 26 %
Lymphs Abs: 1.7 10*3/uL (ref 0.7–4.0)
MCH: 29.5 pg (ref 26.0–34.0)
MCHC: 33 g/dL (ref 30.0–36.0)
MCV: 89.4 fL (ref 80.0–100.0)
Monocytes Absolute: 0.4 10*3/uL (ref 0.1–1.0)
Monocytes Relative: 5 %
Neutro Abs: 4.2 10*3/uL (ref 1.7–7.7)
Neutrophils Relative %: 65 %
Platelets: 309 10*3/uL (ref 150–400)
RBC: 3.76 MIL/uL — ABNORMAL LOW (ref 3.87–5.11)
RDW: 15 % (ref 11.5–15.5)
WBC: 6.5 10*3/uL (ref 4.0–10.5)
nRBC: 0 % (ref 0.0–0.2)

## 2022-03-03 LAB — BASIC METABOLIC PANEL
Anion gap: 10 (ref 5–15)
BUN: 23 mg/dL (ref 8–23)
CO2: 29 mmol/L (ref 22–32)
Calcium: 9.4 mg/dL (ref 8.9–10.3)
Chloride: 102 mmol/L (ref 98–111)
Creatinine, Ser: 1.09 mg/dL — ABNORMAL HIGH (ref 0.44–1.00)
GFR, Estimated: 57 mL/min — ABNORMAL LOW (ref >60)
Glucose, Bld: 92 mg/dL (ref 70–99)
Potassium: 3.7 mmol/L (ref 3.5–5.1)
Sodium: 141 mmol/L (ref 135–145)

## 2022-03-03 MED ORDER — METHYLPREDNISOLONE SODIUM SUCC 125 MG IJ SOLR
125.0000 mg | Freq: Once | INTRAMUSCULAR | Status: AC
  Administered 2022-03-03: 125 mg via INTRAVENOUS
  Filled 2022-03-03: qty 2

## 2022-03-03 MED ORDER — DIPHENHYDRAMINE HCL 50 MG/ML IJ SOLN
25.0000 mg | Freq: Once | INTRAMUSCULAR | Status: AC
  Administered 2022-03-03: 25 mg via INTRAVENOUS
  Filled 2022-03-03: qty 1

## 2022-03-03 NOTE — ED Triage Notes (Signed)
Pt c/o left eye and tongue swelling x 20 minutes. States she takes lisinopril

## 2022-03-03 NOTE — ED Provider Notes (Signed)
[facility] EMERGENCY DEPT Provider Note   CSN: [id number] Arrival date & time: 03/03/22  0114     History  Chief Complaint  Patient presents with   Allergic Reaction    Amanda Alexander is a 61 y.o. female.  Patient is a 61 year old female with past medical history of hypertension, diabetes, GERD.  Patient presenting today with complaints of tongue swelling.  Earlier this evening, she noticed swelling to the right side of her tongue that began in the absence of any injury or trauma.  She denies any throat involvement.  She denies any difficulty breathing or swallowing.  She denies any new contacts or exposures, but has been on lisinopril for many years.  The history is provided by the patient.       Home Medications Prior to Admission medications   Medication Sig Start Date End Date Taking? Authorizing Provider  ACCU-CHEK FASTCLIX LANCETS MISC 1 Container by Does not apply route 2 (two) times daily. 12/06/13   Jerrye Noble, MD  aspirin EC 81 MG tablet Take 81 mg by mouth daily.    [provider]  calcium carbonate (OSCAL) 1500 (600 Ca) MG TABS tablet Take 1 tablet by mouth daily. 09/06/21   [provider]  cyclobenzaprine (FLEXERIL) 5 MG tablet Take 5 mg by mouth 3 (three) times daily as needed. 09/01/21   [provider]  dapagliflozin propanediol (FARXIGA) 10 MG TABS tablet Take 1 tablet (10 mg total) by mouth daily before breakfast. 08/21/20   Renato Shin, MD  dexamethasone (DECADRON) 0.1 % ophthalmic solution 1 drop 3 (three) times daily. 08/30/21   [provider]  diclofenac sodium (VOLTAREN) 1 % GEL Apply 2 g topically 4 (four) times daily. 12/31/18   Minette Brine, FNP  diltiazem (CARDIZEM CD) 240 MG 24 hr capsule Take 240 mg by mouth daily. 08/16/21   [provider]  furosemide (LASIX) 40 MG tablet Take 1 tablet (40 mg total) by mouth daily. 11/16/19 03/23/20  Glendale Chard, MD  glucose blood test strip 1 each by  Other route as needed for other. Use as instructed    [provider]  insulin glargine, 1 Unit Dial, (TOUJEO SOLOSTAR) 300 UNIT/ML Solostar Pen INJECT 50 Units by Subcutaneous Infusion route daily FOR 30 DAYS 02/24/20   Glendale Chard, MD  lisinopril (ZESTRIL) 40 MG tablet Take 1 tablet (40 mg total) by mouth daily. 03/04/20 06/02/20  Minette Brine, FNP  metFORMIN (GLUCOPHAGE) 1000 MG tablet TAKE ONE TABLET BY MOUTH TWICE DAILY WITH breakfast AND dinner 03/09/20   Minette Brine, FNP  nitroGLYCERIN (NITROSTAT) 0.4 MG SL tablet Place 1 tablet (0.4 mg total) under the tongue every 5 (five) minutes as needed for chest pain. 05/12/19   Ripley Fraise, MD  NONFORMULARY OR COMPOUNDED ITEM Antifungal solution: Terbinafine 3%, Fluconazole 2%, Tea Tree Oil 5%, Urea 10%, Ibuprofen 2% in DMSO suspension #68mL 12/02/19   Galaway, Stephani Police, DPM  pantoprazole (PROTONIX) 40 MG tablet Take 1 tablet (40 mg total) by mouth daily. NEED OV. 03/04/20   Minette Brine, FNP  pravastatin (PRAVACHOL) 20 MG tablet Take 1 tablet (20 mg total) by mouth every evening. 03/04/20   Minette Brine, FNP  Semaglutide, 1 MG/DOSE, (OZEMPIC, 1 MG/DOSE,) 2 MG/1.5ML SOPN Inject 1 mg into the skin once a week. 08/21/20   Renato Shin, MD  SURE COMFORT PEN NEEDLES 32G X 6 MM MISC USE TO inject EVERY DAY 07/31/20   Glendale Chard, MD  traMADol (ULTRAM) 50 MG tablet  Take 100 mg by mouth 3 (three) times daily as needed. 08/14/21   [provider]  Vitamin D, Ergocalciferol, (DRISDOL) 1.25 MG (50000 UNIT) CAPS capsule Take 50,000 Units by mouth once a week. 09/01/21   [provider]      Allergies    Lisinopril, Sulfa antibiotics, and Miralax [polyethylene glycol]    Review of Systems   Review of Systems  All other systems reviewed and are negative.   Physical Exam Updated Vital Signs BP (!) 170/76   Pulse 70   Temp 98.2 F (36.8 C)   Resp 18   Ht 5' (1.524 m)   Wt 93 kg   SpO2 100%   BMI 40.04 kg/m   Physical Exam Vitals and nursing note reviewed.  Constitutional:      General: She is not in acute distress.    Appearance: She is well-developed. She is not diaphoretic.  HENT:     Head: Normocephalic and atraumatic.     Mouth/Throat:     Mouth: Mucous membranes are moist.     Comments: There is swelling noted to the right side of the tongue, but not to the extent of causing occlusion.  There is no stridor.  There is no posterior oropharyngeal involvement or lip involvement. Cardiovascular:     Rate and Rhythm: Normal rate and regular rhythm.     Heart sounds: No murmur heard.    No friction rub. No gallop.  Pulmonary:     Effort: Pulmonary effort is normal. No respiratory distress.     Breath sounds: Normal breath sounds. No wheezing.  Abdominal:     General: Bowel sounds are normal. There is no distension.     Palpations: Abdomen is soft.     Tenderness: There is no abdominal tenderness.  Musculoskeletal:        General: Normal range of motion.     Cervical back: Normal range of motion and neck supple.  Skin:    General: Skin is warm and dry.  Neurological:     General: No focal deficit present.     Mental Status: She is alert and oriented to person, place, and time.     ED Results / Procedures / Treatments   Labs (all labs ordered are listed, but only abnormal results are displayed) Labs Reviewed - No data to display  EKG None  Radiology No results found.  Procedures Procedures    Medications Ordered in ED Medications - No data to display  ED Course/ Medical Decision Making/ A&P  Patient presenting here with complaints of tongue swelling that I suspect is related to angioedema from ACE inhibitor therapy.  Patient has been observed here for several hours and symptoms appear to be improving.  I did give Solu-Medrol and Benadryl.  Patient is not having any stridor and there does not appear to be any airway issues at this time.  Her swelling is decreasing and I  feel can safely be discharged.  Final Clinical Impression(s) / ED Diagnoses Final diagnoses:  None    Rx / DC Orders ED Discharge Orders     None         Geoffery Lyons, MD 03/03/22 [phone]

## 2022-03-03 NOTE — Discharge Instructions (Signed)
Stop taking your lisinopril.  Keep a record of your blood pressures at home and take this with you to your next doctor's appointment.  Return to the ER if you develop worsening swelling, difficulty breathing, difficulty swallowing, or for other new and concerning symptoms.

## 2022-03-14 ENCOUNTER — Encounter (HOSPITAL_BASED_OUTPATIENT_CLINIC_OR_DEPARTMENT_OTHER): Payer: Self-pay

## 2022-03-14 DIAGNOSIS — Z7984 Long term (current) use of oral hypoglycemic drugs: Secondary | ICD-10-CM | POA: Diagnosis not present

## 2022-03-14 DIAGNOSIS — Z794 Long term (current) use of insulin: Secondary | ICD-10-CM | POA: Diagnosis not present

## 2022-03-14 DIAGNOSIS — E119 Type 2 diabetes mellitus without complications: Secondary | ICD-10-CM | POA: Diagnosis not present

## 2022-03-14 DIAGNOSIS — Z79899 Other long term (current) drug therapy: Secondary | ICD-10-CM | POA: Insufficient documentation

## 2022-03-14 DIAGNOSIS — H02846 Edema of left eye, unspecified eyelid: Secondary | ICD-10-CM | POA: Insufficient documentation

## 2022-03-14 MED ORDER — ACETAMINOPHEN 325 MG PO TABS: 650.0000 mg | ORAL_TABLET | Freq: Four times a day (QID) | ORAL | Status: DC | PRN

## 2022-03-14 NOTE — ED Triage Notes (Signed)
States eye states eye started swelling Saturday No pain Denies any FB Reports this has happened before

## 2022-03-15 ENCOUNTER — Emergency Department (HOSPITAL_BASED_OUTPATIENT_CLINIC_OR_DEPARTMENT_OTHER)
Admission: EM | Admit: 2022-03-15 | Discharge: 2022-03-15 | Disposition: A | Discharge status: Home / Self Care | Payer: BC Managed Care – PPO | Attending: Emergency Medicine | Admitting: Emergency Medicine

## 2022-03-15 DIAGNOSIS — R600 Localized edema: Secondary | ICD-10-CM

## 2022-03-15 MED ORDER — PREDNISONE 20 MG PO TABS
20.0000 mg | ORAL_TABLET | Freq: Once | ORAL | Status: AC
  Administered 2022-03-15: 20 mg via ORAL
  Filled 2022-03-15: qty 1

## 2022-03-15 MED ORDER — PREDNISONE 20 MG PO TABS: 20.0000 mg | ORAL_TABLET | Freq: Every day | ORAL | 0 refills | Status: AC

## 2022-03-15 NOTE — ED Provider Notes (Signed)
[facility] EMERGENCY DEPT  Provider Note  CSN: [id number] Arrival date & time: 03/14/22 2153  History Chief Complaint  Patient presents with   left eye swelling    Amanda Alexander is a 60 y.o. female with history of DM reports she has had intermittent episodes of L periorbital swelling off and on for several months. Typically comes and goes without any clear reason. She was seen in the ED about 2 weeks ago for tongue swelling thought to be related to lisinopril use, but she is no longer taking that. She reports she has been to see optometry who told her the eye itself is fine and recommended ophthalmology follow up, but she has not yet been able to get an appointment. She denies any pain or blurry vision. She was here with a family member and decided to get seen for this while she was here. She has taken benadryl with some improvement.    Home Medications Prior to Admission medications   Medication Sig Start Date End Date Taking? Authorizing Provider  predniSONE (DELTASONE) 20 MG tablet Take 1 tablet (20 mg total) by mouth daily for 4 days. 03/15/22 03/19/22 Yes Pollyann Savoy, MD  ACCU-CHEK FASTCLIX LANCETS MISC 1 Container by Does not apply route 2 (two) times daily. 12/06/13   Carolan Clines, MD  aspirin EC 81 MG tablet Take 81 mg by mouth daily.    [provider]  calcium carbonate (OSCAL) 1500 (600 Ca) MG TABS tablet Take 1 tablet by mouth daily. 09/06/21   [provider]  cyclobenzaprine (FLEXERIL) 5 MG tablet Take 5 mg by mouth 3 (three) times daily as needed. 09/01/21   [provider]  dapagliflozin propanediol (FARXIGA) 10 MG TABS tablet Take 1 tablet (10 mg total) by mouth daily before breakfast. 08/21/20   Romero Belling, MD  dexamethasone (DECADRON) 0.1 % ophthalmic solution 1 drop 3 (three) times daily. 08/30/21   [provider]  diclofenac sodium (VOLTAREN) 1 % GEL Apply 2 g topically 4 (four) times daily. 12/31/18   Arnette Felts, FNP  diltiazem (CARDIZEM CD) 240 MG 24 hr capsule Take 240 mg by mouth daily. 08/16/21   [provider]  furosemide (LASIX) 40 MG tablet Take 1 tablet (40 mg total) by mouth daily. 11/16/19 03/23/20  Dorothyann Peng, MD  glucose blood test strip 1 each by Other route as needed for other. Use as instructed    [provider]  insulin glargine, 1 Unit Dial, (TOUJEO SOLOSTAR) 300 UNIT/ML Solostar Pen INJECT 50 Units by Subcutaneous Infusion route daily FOR 30 DAYS 02/24/20   Dorothyann Peng, MD  lisinopril (ZESTRIL) 40 MG tablet Take 1 tablet (40 mg total) by mouth daily. 03/04/20 06/02/20  Arnette Felts, FNP  metFORMIN (GLUCOPHAGE) 1000 MG tablet TAKE ONE TABLET BY MOUTH TWICE DAILY WITH breakfast AND dinner 03/09/20   Arnette Felts, FNP  nitroGLYCERIN (NITROSTAT) 0.4 MG SL tablet Place 1 tablet (0.4 mg total) under the tongue every 5 (five) minutes as needed for chest pain. 05/12/19   Zadie Rhine, MD  NONFORMULARY OR COMPOUNDED ITEM Antifungal solution: Terbinafine 3%, Fluconazole 2%, Tea Tree Oil 5%, Urea 10%, Ibuprofen 2% in DMSO suspension #17mL 12/02/19   Galaway, Lise Auer, DPM  pantoprazole (PROTONIX) 40 MG tablet Take 1 tablet (40 mg total) by mouth daily. NEED OV. 03/04/20   Arnette Felts, FNP  pravastatin (PRAVACHOL) 20 MG tablet Take 1 tablet (20 mg total) by mouth every evening. 03/04/20   Arnette Felts,  FNP  Semaglutide, 1 MG/DOSE, (OZEMPIC, 1 MG/DOSE,) 2 MG/1.5ML SOPN Inject 1 mg into the skin once a week. 08/21/20   Renato Shin, MD  SURE COMFORT PEN NEEDLES 32G X 6 MM MISC USE TO inject EVERY DAY 07/31/20   Glendale Chard, MD  traMADol (ULTRAM) 50 MG tablet Take 100 mg by mouth 3 (three) times daily as needed. 08/14/21   [provider]  Vitamin D, Ergocalciferol, (DRISDOL) 1.25 MG (50000 UNIT) CAPS capsule Take 50,000 Units by mouth once a week. 09/01/21   [provider]     Allergies    Lisinopril, Sulfa antibiotics, and Miralax [polyethylene  glycol]   Review of Systems   Review of Systems Please see HPI for pertinent positives and negatives  Physical Exam BP 127/73   Pulse 72   Temp 99.2 F (37.3 C) (Oral)   Resp 18   Ht 5' (1.524 m)   Wt 94.3 kg   SpO2 97%   BMI 40.62 kg/m   Physical Exam Vitals and nursing note reviewed.  HENT:     Head: Normocephalic.     Nose: Nose normal.  Eyes:     Extraocular Movements: Extraocular movements intact.     Pupils: Pupils are equal, round, and reactive to light.     Comments: Mild L periorbital edema and conjunctival injection  Pulmonary:     Effort: Pulmonary effort is normal.  Musculoskeletal:        General: Normal range of motion.     Cervical back: Neck supple.  Skin:    Findings: No rash (on exposed skin).  Neurological:     Mental Status: She is alert and oriented to person, place, and time.  Psychiatric:        Mood and Affect: Mood normal.      ED Results / Procedures / Treatments   EKG None  Procedures Procedures  Medications Ordered in the ED Medications  predniSONE (DELTASONE) tablet 20 mg (has no administration in time range)    Initial Impression and Plan  Patient with recurrent periorbital edema of unclear etiology. No longer on ACE-I so not likely to be that. Recommend she continue antihistamines, add short course of steroids, advised to monitor sugar closely. Ophtho follow up.   ED Course       MDM Rules/Calculators/A&P Medical Decision Making Problems Addressed: Periorbital edema of left eye: chronic illness or injury with exacerbation, progression, or side effects of treatment  Risk Prescription drug management.    Final Clinical Impression(s) / ED Diagnoses Final diagnoses:  Periorbital edema of left eye    Rx / DC Orders ED Discharge Orders          Ordered    predniSONE (DELTASONE) 20 MG tablet  Daily        03/15/22 0252             Truddie Hidden, MD 03/15/22 [phone]

## 2022-04-12 ENCOUNTER — Ambulatory Visit: Payer: BC Managed Care – PPO | Admitting: Podiatry

## 2022-06-08 ENCOUNTER — Encounter: Payer: Self-pay | Admitting: Nurse Practitioner

## 2022-06-08 DIAGNOSIS — Z1231 Encounter for screening mammogram for malignant neoplasm of breast: Secondary | ICD-10-CM

## 2022-07-11 ENCOUNTER — Other Ambulatory Visit: Payer: Self-pay | Admitting: Nurse Practitioner

## 2022-07-11 DIAGNOSIS — Z1231 Encounter for screening mammogram for malignant neoplasm of breast: Secondary | ICD-10-CM

## 2022-08-16 ENCOUNTER — Other Ambulatory Visit: Payer: Self-pay

## 2022-08-16 DIAGNOSIS — I739 Peripheral vascular disease, unspecified: Secondary | ICD-10-CM

## 2022-08-16 DIAGNOSIS — R600 Localized edema: Secondary | ICD-10-CM

## 2022-08-23 ENCOUNTER — Ambulatory Visit: Payer: BC Managed Care – PPO | Admitting: Vascular Surgery

## 2022-08-23 ENCOUNTER — Ambulatory Visit (HOSPITAL_COMMUNITY)
Admission: RE | Admit: 2022-08-23 | Discharge: 2022-08-23 | Disposition: A | Discharge status: Home / Self Care | Payer: BC Managed Care – PPO | Source: Ambulatory Visit | Attending: Vascular Surgery | Admitting: Vascular Surgery

## 2022-08-23 ENCOUNTER — Encounter: Payer: Self-pay | Admitting: Vascular Surgery

## 2022-08-23 VITALS — BP 128/62 | HR 62 | Temp 98.6°F | Resp 20 | Ht 60.0 in | Wt 204.0 lb

## 2022-08-23 DIAGNOSIS — I872 Venous insufficiency (chronic) (peripheral): Secondary | ICD-10-CM | POA: Diagnosis not present

## 2022-08-23 DIAGNOSIS — R600 Localized edema: Secondary | ICD-10-CM | POA: Diagnosis not present

## 2022-08-23 DIAGNOSIS — I739 Peripheral vascular disease, unspecified: Secondary | ICD-10-CM | POA: Diagnosis present

## 2022-08-23 NOTE — Progress Notes (Signed)
[facility] [place]  ASSESSMENT / PLAN: 62 y.o. female with discomfort in bilateral lower extremities.  Her clinical exam and noninvasive testing are reassuring.  I do not think her cramping discomfort is from arterial insufficiency.  She does have chronic venous insufficiency with swelling and reticular veins (C3 disease).  Recommend compression and elevation to treat swelling.  She can follow-up with me on an as-needed basis.  CHIEF COMPLAINT: cramping pain in legs.  HISTORY OF PRESENT ILLNESS: Amanda Alexander is a 62 y.o. female referred to clinic for evaluation of cramping discomfort in the bilateral lower extremities. This pain is not related to walking. It comes and goes. The pain is mostly about her hip and thigh. She does not describe classic claudication symptoms. She does not describe rest pain. She has no ulcers about her feet.   Past Medical History:  Diagnosis Date   Diabetes mellitus without complication (HCC)    GERD (gastroesophageal reflux disease) 05/23/2014   Hypertension    Lower extremity edema 07/15/2016   OSA on CPAP     Past Surgical History:  Procedure Laterality Date   TONSILLECTOMY Bilateral     Family History  Problem Relation Age of Onset   Heart disease Mother    Diabetes Mother    Other Father        Polio   Lung cancer Father    Breast cancer Sister    Arthritis Sister    Cancer Sister    COPD Sister    Diabetes Brother    Hyperlipidemia Brother    Other Brother        polio and gun shot death   Sleep apnea Son     Social History   Socioeconomic History   Marital status: Widowed    Spouse name: Not on file   Number of children: Not on file   Years of education: Not on file   Highest education level: Not on file  Occupational History   Not on file  Tobacco Use   Smoking status: Never   Smokeless tobacco: Never  Vaping Use   Vaping Use: Never used  Substance and Sexual Activity   Alcohol use: Yes     Alcohol/week: 0.0 standard drinks of alcohol    Comment: Has a glass of wine on occasion.   Drug use: Never   Sexual activity: Never  Other Topics Concern   Not on file  Social History Narrative   Not on file   Social Determinants of Health   Financial Resource Strain: Not on file  Food Insecurity: Not on file  Transportation Needs: Not on file  Physical Activity: Not on file  Stress: Not on file  Social Connections: Not on file  Intimate Partner Violence: Not on file    Allergies  Allergen Reactions   Lisinopril    Sulfa Antibiotics    Miralax [Polyethylene Glycol] Palpitations    diarrhea    Current Outpatient Medications  Medication Sig Dispense Refill   ACCU-CHEK FASTCLIX LANCETS MISC 1 Container by Does not apply route 2 (two) times daily. 102 each 0   aspirin EC 81 MG tablet Take 81 mg by mouth daily.     calcium carbonate (OSCAL) 1500 (600 Ca) MG TABS tablet Take 1 tablet by mouth daily.     cyclobenzaprine (FLEXERIL) 5 MG tablet Take 5 mg by mouth 3 (three) times daily as needed.     dapagliflozin propanediol (FARXIGA) 10 MG TABS tablet Take 1 tablet (  10 mg total) by mouth daily before breakfast. 90 tablet 3   diclofenac sodium (VOLTAREN) 1 % GEL Apply 2 g topically 4 (four) times daily. 50 g 2   diltiazem (CARDIZEM CD) 240 MG 24 hr capsule Take 240 mg by mouth daily.     glucose blood test strip 1 each by Other route as needed for other. Use as instructed     insulin glargine, 1 Unit Dial, (TOUJEO SOLOSTAR) 300 UNIT/ML Solostar Pen INJECT 50 Units by Subcutaneous Infusion route daily FOR 30 DAYS 4.5 mL 1   losartan (COZAAR) 25 MG tablet Take 25 mg by mouth daily.     metFORMIN (GLUCOPHAGE) 1000 MG tablet TAKE ONE TABLET BY MOUTH TWICE DAILY WITH breakfast AND dinner 180 tablet 1   nitroGLYCERIN (NITROSTAT) 0.4 MG SL tablet Place 1 tablet (0.4 mg total) under the tongue every 5 (five) minutes as needed for chest pain. 30 tablet 0   NONFORMULARY OR COMPOUNDED ITEM  Antifungal solution: Terbinafine 3%, Fluconazole 2%, Tea Tree Oil 5%, Urea 10%, Ibuprofen 2% in DMSO suspension #50mL 1 each 3   pantoprazole (PROTONIX) 40 MG tablet Take 1 tablet (40 mg total) by mouth daily. NEED OV. 90 tablet 1   pravastatin (PRAVACHOL) 20 MG tablet Take 1 tablet (20 mg total) by mouth every evening. 90 tablet 0   Semaglutide, 1 MG/DOSE, (OZEMPIC, 1 MG/DOSE,) 2 MG/1.5ML SOPN Inject 1 mg into the skin once a week. 4.5 mL 3   SURE COMFORT PEN NEEDLES 32G X 6 MM MISC USE TO inject EVERY DAY 100 each 2   traMADol (ULTRAM) 50 MG tablet Take 100 mg by mouth 3 (three) times daily as needed.     Vitamin D, Ergocalciferol, (DRISDOL) 1.25 MG (50000 UNIT) CAPS capsule Take 50,000 Units by mouth once a week.     furosemide (LASIX) 40 MG tablet Take 1 tablet (40 mg total) by mouth daily. 90 tablet 1   No current facility-administered medications for this visit.    PHYSICAL EXAM Vitals:   08/23/22 1433  BP: 128/62  Pulse: 62  Resp: 20  Temp: 98.6 F (37 C)  SpO2: 98%  Weight: 204 lb (92.5 kg)  Height: 5' (1.524 m)   Well appearing woman in no distress Regular rate and rhythm Unlabored breathing Scattered reticular veins about bilateral legs 2+ DPs  PERTINENT LABORATORY AND RADIOLOGIC DATA  Most recent CBC    Latest Ref Rng & Units 03/03/2022    2:37 AM 09/24/2021    2:14 PM 05/13/2019   10:30 AM  CBC  WBC 4.0 - 10.5 K/uL 6.5  5.7  5.7   Hemoglobin 12.0 - 15.0 g/dL 16.1  09.6  04.5   Hematocrit 36.0 - 46.0 % 33.6  36.8  34.9   Platelets 150 - 400 K/uL 309  308  308      Most recent CMP    Latest Ref Rng & Units 03/03/2022    2:37 AM 09/24/2021    2:14 PM 03/23/2020    3:51 PM  CMP  Glucose 70 - 99 mg/dL 92  98  409   BUN 8 - 23 mg/dL 23  20  15    Creatinine 0.44 - 1.00 mg/dL 8.11  9.14  7.82   Sodium 135 - 145 mmol/L 141  138  138   Potassium 3.5 - 5.1 mmol/L 3.7  4.3  4.1   Chloride 98 - 111 mmol/L 102  101  98   CO2 22 - 32 mmol/L  29  32  27   Calcium 8.9  - 10.3 mg/dL 9.4  9.2  9.6   Total Protein 6.5 - 8.1 g/dL  7.7    Total Bilirubin 0.3 - 1.2 mg/dL  0.5    Alkaline Phos 38 - 126 U/L  86    AST 15 - 41 U/L  15    ALT 0 - 44 U/L  18      Renal function CrCl cannot be calculated (Patient's most recent lab result is older than the maximum 21 days allowed.).  Hemoglobin A1C (%)  Date Value  08/21/2020 7.3 (A)   Hgb A1c MFr Bld (%)  Date Value  03/23/2020 9.8 (H)    LDL Chol Calc (NIH)  Date Value Ref Range Status  08/26/2019 66 0 - 99 mg/dL Final     +-------+-----------+-----------+------------+------------+  ABI/TBIToday's ABIToday's TBIPrevious ABIPrevious TBI  +-------+-----------+-----------+------------+------------+  Right 1.05       0.73                                 +-------+-----------+-----------+------------+------------+  Left  1.05       0.93                                 +-------+-----------+-----------+------------+------------+    Rande Brunt. Lenell Antu, MD [facility] Phone Number: [phone] 08/23/2022 4:07 PM   Total time spent on preparing this encounter including chart review, data review, collecting history, examining the patient, coordinating care for this new patient, 60 minutes.  Portions of this report may have been transcribed using voice recognition software.  Every effort has been made to ensure accuracy; however, inadvertent computerized transcription errors may still be present.

## 2022-08-24 LAB — VAS US ABI WITH/WO TBI
Left ABI: 1.05
Right ABI: 1.05

## 2022-09-02 ENCOUNTER — Ambulatory Visit
Admission: RE | Admit: 2022-09-02 | Discharge: 2022-09-02 | Disposition: A | Discharge status: Home / Self Care | Payer: BC Managed Care – PPO | Source: Ambulatory Visit | Attending: Nurse Practitioner | Admitting: Nurse Practitioner

## 2022-09-02 DIAGNOSIS — Z1231 Encounter for screening mammogram for malignant neoplasm of breast: Secondary | ICD-10-CM

## 2023-07-20 ENCOUNTER — Emergency Department (HOSPITAL_BASED_OUTPATIENT_CLINIC_OR_DEPARTMENT_OTHER)
Admission: EM | Admit: 2023-07-20 | Discharge: 2023-07-20 | Disposition: A | Discharge status: Home / Self Care | Payer: Worker's Compensation | Attending: Emergency Medicine | Admitting: Emergency Medicine

## 2023-07-20 ENCOUNTER — Emergency Department (HOSPITAL_BASED_OUTPATIENT_CLINIC_OR_DEPARTMENT_OTHER): Payer: Worker's Compensation | Admitting: Radiology

## 2023-07-20 ENCOUNTER — Encounter (HOSPITAL_BASED_OUTPATIENT_CLINIC_OR_DEPARTMENT_OTHER): Payer: Self-pay | Admitting: Emergency Medicine

## 2023-07-20 DIAGNOSIS — W010XXA Fall on same level from slipping, tripping and stumbling without subsequent striking against object, initial encounter: Secondary | ICD-10-CM | POA: Insufficient documentation

## 2023-07-20 DIAGNOSIS — M25512 Pain in left shoulder: Secondary | ICD-10-CM | POA: Insufficient documentation

## 2023-07-20 DIAGNOSIS — M25522 Pain in left elbow: Secondary | ICD-10-CM | POA: Diagnosis not present

## 2023-07-20 DIAGNOSIS — Z7982 Long term (current) use of aspirin: Secondary | ICD-10-CM | POA: Diagnosis not present

## 2023-07-20 DIAGNOSIS — Y990 Civilian activity done for income or pay: Secondary | ICD-10-CM | POA: Diagnosis not present

## 2023-07-20 DIAGNOSIS — W19XXXA Unspecified fall, initial encounter: Secondary | ICD-10-CM

## 2023-07-20 DIAGNOSIS — M25562 Pain in left knee: Secondary | ICD-10-CM | POA: Insufficient documentation

## 2023-07-20 MED ORDER — ETODOLAC 400 MG PO TABS: 400.0000 mg | ORAL_TABLET | Freq: Two times a day (BID) | ORAL | 0 refills | Status: AC

## 2023-07-20 MED ORDER — KETOROLAC TROMETHAMINE 15 MG/ML IJ SOLN
15.0000 mg | Freq: Once | INTRAMUSCULAR | Status: AC
  Administered 2023-07-20: 15 mg via INTRAMUSCULAR
  Filled 2023-07-20: qty 1

## 2023-07-20 NOTE — ED Triage Notes (Signed)
 Trip fall at work. Tripped over a matt. Pain in left knee and left shoulder to elbow No loc did not hit head

## 2023-07-20 NOTE — ED Provider Triage Note (Signed)
 Emergency Medicine Provider Triage Evaluation Note  Amanda Alexander , a 63 y.o. female  was evaluated in triage.  Pt complains of fall after tripping on a rug. No head injury. Complaining of pain to left shoulder, elbow, and left knee..  Review of Systems  Positive: As above Negative: As above  Physical Exam  BP (!) 140/72   Pulse 65   Temp 98 F (36.7 C)   Resp 18   SpO2 98%  Gen:   Awake, no distress   Resp:  Normal effort MSK:   Moves extremities without difficulty  Other:    Medical Decision Making  Medically screening exam initiated at 6:12 PM.  Appropriate orders placed.  Amanda Alexander was informed that the remainder of the evaluation will be completed by another provider, this initial triage assessment does not replace that evaluation, and the importance of remaining in the ED until their evaluation is complete.   Hildegard Loge, PA-C 07/20/23 [phone]

## 2023-07-20 NOTE — Discharge Instructions (Signed)
 Your x-rays did not show any fracture.  You were given Toradol  in the emergency department.  Take Lodine  as prescribed.  Follow-up with your primary care doctor.

## 2023-07-20 NOTE — ED Provider Notes (Signed)
 [place] EMERGENCY DEPARTMENT AT [facility] Provider Note   CSN: [id number] Arrival date & time: 07/20/23  1734     History  Chief Complaint  Patient presents with   Amanda Alexander is a 63 y.o. female.  63 year old female presents today for concern of falling after tripping on a rug just prior to arrival.  Denies any head injury.  No loss of consciousness.  Not on blood thinners.  Complaining of pain to the left shoulder, left elbow, left knee.  This occurred while Amanda Alexander was at work.  The history is provided by the patient. No language interpreter was used.       Home Medications Prior to Admission medications   Medication Sig Start Date End Date Taking? Authorizing Provider  ACCU-CHEK FASTCLIX LANCETS MISC 1 Container by Does not apply route 2 (two) times daily. 12/06/13   Monty Altamese HERO, MD  aspirin  EC 81 MG tablet Take 81 mg by mouth daily.    [provider]  calcium carbonate (OSCAL) 1500 (600 Ca) MG TABS tablet Take 1 tablet by mouth daily. 09/06/21   [provider]  cyclobenzaprine (FLEXERIL) 5 MG tablet Take 5 mg by mouth 3 (three) times daily as needed. 09/01/21   [provider]  dapagliflozin  propanediol (FARXIGA ) 10 MG TABS tablet Take 1 tablet (10 mg total) by mouth daily before breakfast. 08/21/20   Kassie Mallick, MD  diclofenac  sodium (VOLTAREN ) 1 % GEL Apply 2 g topically 4 (four) times daily. 12/31/18   Moore, Janece, FNP  diltiazem (CARDIZEM CD) 240 MG 24 hr capsule Take 240 mg by mouth daily. 08/16/21   [provider]  furosemide  (LASIX ) 40 MG tablet Take 1 tablet (40 mg total) by mouth daily. 11/16/19 03/23/20  Jarold Medici, MD  glucose blood test strip 1 each by Other route as needed for other. Use as instructed    [provider]  insulin  glargine, 1 Unit Dial , (TOUJEO  SOLOSTAR) 300 UNIT/ML Solostar Pen INJECT 50 Units by Subcutaneous Infusion route daily FOR 30 DAYS 02/24/20   Jarold Medici, MD   losartan (COZAAR) 25 MG tablet Take 25 mg by mouth daily. 07/25/22   [provider]  metFORMIN  (GLUCOPHAGE ) 1000 MG tablet TAKE ONE TABLET BY MOUTH TWICE DAILY WITH breakfast AND dinner 03/09/20   Georgina Speaks, FNP  nitroGLYCERIN  (NITROSTAT ) 0.4 MG SL tablet Place 1 tablet (0.4 mg total) under the tongue every 5 (five) minutes as needed for chest pain. 05/12/19   Midge Golas, MD  NONFORMULARY OR COMPOUNDED ITEM Antifungal solution: Terbinafine 3%, Fluconazole 2%, Tea Tree Oil 5%, Urea 10%, Ibuprofen 2% in DMSO suspension #30mL 12/02/19   Galaway, Delon CROME, DPM  pantoprazole  (PROTONIX ) 40 MG tablet Take 1 tablet (40 mg total) by mouth daily. NEED OV. 03/04/20   Georgina Speaks, FNP  pravastatin  (PRAVACHOL ) 20 MG tablet Take 1 tablet (20 mg total) by mouth every evening. 03/04/20   Georgina Speaks, FNP  Semaglutide , 1 MG/DOSE, (OZEMPIC , 1 MG/DOSE,) 2 MG/1.5ML SOPN Inject 1 mg into the skin once a week. 08/21/20   Kassie Mallick, MD  SURE COMFORT PEN NEEDLES 32G X 6 MM MISC USE TO inject EVERY DAY 07/31/20   Jarold Medici, MD  traMADol  (ULTRAM ) 50 MG tablet Take 100 mg by mouth 3 (three) times daily as needed. 08/14/21   [provider]  Vitamin D, Ergocalciferol, (DRISDOL) 1.25 MG (50000 UNIT) CAPS capsule Take 50,000 Units by mouth once a week. 09/01/21  [provider]      Allergies    Lisinopril , Sulfa antibiotics, and Miralax [polyethylene glycol]    Review of Systems   Review of Systems  Constitutional:  Negative for chills and fever.  Musculoskeletal:  Positive for arthralgias. Negative for myalgias.  All other systems reviewed and are negative.   Physical Exam Updated Vital Signs BP (!) 140/72   Pulse 65   Temp 98 F (36.7 C)   Resp 18   SpO2 98%  Physical Exam Vitals and nursing note reviewed.  Constitutional:      General: Amanda Alexander is not in acute distress.    Appearance: Normal appearance. Amanda Alexander is not ill-appearing.  HENT:     Head: Normocephalic and  atraumatic.     Nose: Nose normal.  Eyes:     Conjunctiva/sclera: Conjunctivae normal.  Pulmonary:     Effort: Pulmonary effort is normal. No respiratory distress.  Musculoskeletal:        General: No deformity.     Comments: No range of motion in the left shoulder, left elbow.  Mild tenderness to palpation over the left shoulder.  Neurovascularly intact in the left upper extremity.  Cervical spine without tenderness to palpation.  Ambulates without difficulty.  Good range of motion of the left knee.  Without tenderness to palpation.  Skin:    Findings: No rash.  Neurological:     Mental Status: Amanda Alexander is alert.     ED Results / Procedures / Treatments   Labs (all labs ordered are listed, but only abnormal results are displayed) Labs Reviewed - No data to display  EKG None  Radiology DG Shoulder Left Result Date: 07/20/2023 CLINICAL DATA:  Fall. EXAM: LEFT SHOULDER - 2+ VIEW; LEFT KNEE - COMPLETE 4+ VIEW; LEFT ELBOW - COMPLETE 3+ VIEW COMPARISON:  None Available. FINDINGS: Left knee: There is no evidence of acute fracture or dislocation. No joint effusion is seen. Moderate-to-severe degenerative tricompartmental degenerative changes are present in the knee. Soft tissues are unremarkable. Left shoulder: No acute fracture or dislocation is seen. Mild degenerative changes are present at the acromioclavicular joint. Left elbow: No acute fracture or dislocation is seen. Mild to moderate degenerative changes are noted at the elbow. There is no joint effusion. The soft tissues are within normal limits IMPRESSION: No acute fracture or dislocation at the left knee, shoulder, or elbow. Electronically Signed   By: Leita Birmingham M.D.   On: 07/20/2023 20:11   DG Elbow Complete Left Result Date: 07/20/2023 CLINICAL DATA:  Fall. EXAM: LEFT SHOULDER - 2+ VIEW; LEFT KNEE - COMPLETE 4+ VIEW; LEFT ELBOW - COMPLETE 3+ VIEW COMPARISON:  None Available. FINDINGS: Left knee: There is no evidence of acute fracture  or dislocation. No joint effusion is seen. Moderate-to-severe degenerative tricompartmental degenerative changes are present in the knee. Soft tissues are unremarkable. Left shoulder: No acute fracture or dislocation is seen. Mild degenerative changes are present at the acromioclavicular joint. Left elbow: No acute fracture or dislocation is seen. Mild to moderate degenerative changes are noted at the elbow. There is no joint effusion. The soft tissues are within normal limits IMPRESSION: No acute fracture or dislocation at the left knee, shoulder, or elbow. Electronically Signed   By: Leita Birmingham M.D.   On: 07/20/2023 20:11   DG Knee Complete 4 Views Left Result Date: 07/20/2023 CLINICAL DATA:  Fall. EXAM: LEFT SHOULDER - 2+ VIEW; LEFT KNEE - COMPLETE 4+ VIEW; LEFT ELBOW - COMPLETE 3+ VIEW COMPARISON:  None Available.  FINDINGS: Left knee: There is no evidence of acute fracture or dislocation. No joint effusion is seen. Moderate-to-severe degenerative tricompartmental degenerative changes are present in the knee. Soft tissues are unremarkable. Left shoulder: No acute fracture or dislocation is seen. Mild degenerative changes are present at the acromioclavicular joint. Left elbow: No acute fracture or dislocation is seen. Mild to moderate degenerative changes are noted at the elbow. There is no joint effusion. The soft tissues are within normal limits IMPRESSION: No acute fracture or dislocation at the left knee, shoulder, or elbow. Electronically Signed   By: Leita Birmingham M.D.   On: 07/20/2023 20:11    Procedures Procedures    Medications Ordered in ED Medications - No data to display  ED Course/ Medical Decision Making/ A&P                                 Medical Decision Making Amount and/or Complexity of Data Reviewed Radiology: ordered.   63 year old female presents today following a fall.  Complains of left shoulder, left elbow, left knee pain.  X-rays of the left shoulder, left elbow,  left knee obtained.  I have personally reviewed these as well.  No acute bony abnormality noted.  I agree with radiology interpretation.  Patient is appropriate for discharge.  Will give Toradol  in the ED and prescribe Lodine .  Amanda Alexander is agreeable with this.  Amanda Alexander will follow-up with her PCP.  Discharged in stable condition.  Amanda Alexander denies any kidney disease.   Final Clinical Impression(s) / ED Diagnoses Final diagnoses:  Fall, initial encounter    Rx / DC Orders ED Discharge Orders          Ordered    etodolac  (LODINE ) 400 MG tablet  2 times daily        07/20/23 2134              Hildegard Loge, PA-C 07/20/23 2135    Lenor Hollering, MD 07/20/23 2356

## 2023-07-27 ENCOUNTER — Other Ambulatory Visit: Payer: Self-pay | Admitting: Nurse Practitioner

## 2023-07-27 DIAGNOSIS — Z1231 Encounter for screening mammogram for malignant neoplasm of breast: Secondary | ICD-10-CM

## 2023-09-04 ENCOUNTER — Ambulatory Visit: Payer: BC Managed Care – PPO

## 2023-09-05 ENCOUNTER — Ambulatory Visit: Payer: BC Managed Care – PPO | Admitting: Podiatry

## 2023-09-05 DIAGNOSIS — M79675 Pain in left toe(s): Secondary | ICD-10-CM

## 2023-09-05 DIAGNOSIS — M2141 Flat foot [pes planus] (acquired), right foot: Secondary | ICD-10-CM | POA: Diagnosis not present

## 2023-09-05 DIAGNOSIS — M205X9 Other deformities of toe(s) (acquired), unspecified foot: Secondary | ICD-10-CM

## 2023-09-05 DIAGNOSIS — L84 Corns and callosities: Secondary | ICD-10-CM | POA: Diagnosis not present

## 2023-09-05 DIAGNOSIS — B351 Tinea unguium: Secondary | ICD-10-CM

## 2023-09-05 DIAGNOSIS — E119 Type 2 diabetes mellitus without complications: Secondary | ICD-10-CM | POA: Diagnosis not present

## 2023-09-05 DIAGNOSIS — M79674 Pain in right toe(s): Secondary | ICD-10-CM | POA: Diagnosis not present

## 2023-09-05 DIAGNOSIS — M2142 Flat foot [pes planus] (acquired), left foot: Secondary | ICD-10-CM | POA: Diagnosis not present

## 2023-09-10 ENCOUNTER — Encounter: Payer: Self-pay | Admitting: Podiatry

## 2023-09-10 NOTE — Progress Notes (Signed)
  Subjective:  Patient ID: Amanda Alexander, female    DOB: [birth year],  MRN: [id number]  Amanda Alexander presents to clinic today for for annual diabetic foot examination   New problem(s): None.   PCP is Courtney Paris, NP.  Allergies  Allergen Reactions   Lisinopril    Sulfa Antibiotics    Miralax [Polyethylene Glycol] Palpitations    diarrhea    Review of Systems: Negative except as noted in the HPI.  Objective: No changes noted in today's physical examination. There were no vitals filed for this visit. Amanda Alexander is a pleasant 63 y.o. female in NAD. AAO x 3.  Vascular Examination: Capillary refill time immediate b/l. Vascular status intact b/l with palpable pedal pulses. Pedal hair present b/l. No pain with calf compression b/l. Skin temperature gradient WNL b/l. No cyanosis or clubbing b/l. No ischemia or gangrene noted b/l. Trace edema noted bilateral ankles.  Neurological Examination: Sensation grossly intact b/l with 10 gram monofilament. Vibratory sensation intact b/l.   Dermatological Examination: Pedal skin with normal turgor, texture and tone b/l.  No open wounds. No interdigital macerations.   Toenails 1-5 b/l thick, discolored, elongated with subungual debris and pain on dorsal palpation.   Hyperkeratotic lesion(s) R 5th toe.  No erythema, no edema, no drainage, no fluctuance.  Musculoskeletal Examination: Muscle strength 5/5 to all lower extremity muscle groups bilaterally. Adductovarus deformity bilateral 5th toes. Pes planus deformity noted bilateral LE.  Radiographs: None  Last A1c:       No data to display         Assessment/Plan: 1. Pain due to onychomycosis of toenails of both feet   2. Corns   3. Acquired adductovarus rotation of toe, unspecified laterality   4. Pes planus of both feet   5. Type 2 diabetes mellitus without complication, without long-term current use of insulin (HCC)   6. Encounter for diabetic foot exam [facility])    -Patient  was evaluated today. All questions/concerns addressed on today's visit. -Will submit preauthorization to insurance company for diabetic shoes and 3 pair total contact insoles. -Diabetic foot examination performed today. -Continue diabetic foot care principles: inspect feet daily, monitor glucose as recommended by PCP and/or Endocrinologist, and follow prescribed diet per PCP, Endocrinologist and/or dietician. -Patient to continue soft, supportive shoe gear daily. -Toenails 1-5 b/l were debrided in length and girth with sterile nail nippers and dremel without iatrogenic bleeding.  -Corn(s) R 5th toe pared utilizing sterile scalpel blade without complication or incident. Total number debrided=1. -Patient/POA to call should there be question/concern in the interim.   Return in about 3 months (around 12/06/2023).  Freddie Breech, DPM      [facility] LOCATION: [address], [place] [address]                   Office [phone]   [facility] LOCATION: [address] [place], [place] [address] Office [phone]

## 2023-09-13 ENCOUNTER — Telehealth: Payer: Self-pay

## 2023-09-13 NOTE — Telephone Encounter (Signed)
Submitted for prior auth

## 2023-09-13 NOTE — Progress Notes (Signed)
 Gave to Admin to check on pre-cert for shoes and inserts  [id number] / [phone]

## 2023-09-15 ENCOUNTER — Ambulatory Visit
Admission: RE | Admit: 2023-09-15 | Discharge: 2023-09-15 | Disposition: A | Discharge status: Home / Self Care | Source: Ambulatory Visit | Attending: Nurse Practitioner | Admitting: Nurse Practitioner

## 2023-09-15 DIAGNOSIS — Z1231 Encounter for screening mammogram for malignant neoplasm of breast: Secondary | ICD-10-CM

## 2023-09-18 ENCOUNTER — Telehealth: Payer: Self-pay

## 2023-09-18 NOTE — Telephone Encounter (Signed)
 Rec'd ppwk back from [facility] no precert is required for dm shoes

## 2023-10-18 ENCOUNTER — Telehealth: Payer: Self-pay

## 2023-12-14 NOTE — Telephone Encounter (Signed)
 NA

## 2024-01-02 ENCOUNTER — Ambulatory Visit: Admitting: Podiatry

## 2024-04-02 ENCOUNTER — Ambulatory Visit (INDEPENDENT_AMBULATORY_CARE_PROVIDER_SITE_OTHER): Admitting: Podiatry

## 2024-04-02 DIAGNOSIS — Z91199 Patient's noncompliance with other medical treatment and regimen due to unspecified reason: Secondary | ICD-10-CM

## 2024-04-07 NOTE — Progress Notes (Signed)
 1. No-show for appointment
